# Patient Record
Sex: Female | Born: 1935 | Race: White | Hispanic: No | Marital: Married | Smoking: Never smoker
Health system: Southern US, Community
[De-identification: ages and names within clinical notes are randomized; demographics above are authoritative.]

## PROBLEM LIST (undated history)

## (undated) DIAGNOSIS — M8580 Other specified disorders of bone density and structure, unspecified site: Secondary | ICD-10-CM

## (undated) DIAGNOSIS — I480 Paroxysmal atrial fibrillation: Secondary | ICD-10-CM

## (undated) DIAGNOSIS — E538 Deficiency of other specified B group vitamins: Secondary | ICD-10-CM

## (undated) DIAGNOSIS — K449 Diaphragmatic hernia without obstruction or gangrene: Secondary | ICD-10-CM

## (undated) DIAGNOSIS — I7090 Unspecified atherosclerosis: Secondary | ICD-10-CM

## (undated) DIAGNOSIS — M797 Fibromyalgia: Secondary | ICD-10-CM

## (undated) DIAGNOSIS — I10 Essential (primary) hypertension: Secondary | ICD-10-CM

## (undated) DIAGNOSIS — F419 Anxiety disorder, unspecified: Secondary | ICD-10-CM

## (undated) DIAGNOSIS — F32A Depression, unspecified: Secondary | ICD-10-CM

## (undated) DIAGNOSIS — I38 Endocarditis, valve unspecified: Secondary | ICD-10-CM

## (undated) DIAGNOSIS — I495 Sick sinus syndrome: Secondary | ICD-10-CM

## (undated) DIAGNOSIS — F329 Major depressive disorder, single episode, unspecified: Secondary | ICD-10-CM

## (undated) DIAGNOSIS — K589 Irritable bowel syndrome without diarrhea: Secondary | ICD-10-CM

## (undated) DIAGNOSIS — M169 Osteoarthritis of hip, unspecified: Secondary | ICD-10-CM

## (undated) HISTORY — DX: Paroxysmal atrial fibrillation: I48.0

## (undated) HISTORY — DX: Other specified disorders of bone density and structure, unspecified site: M85.80

## (undated) HISTORY — DX: Depression, unspecified: F32.A

## (undated) HISTORY — DX: Essential (primary) hypertension: I10

## (undated) HISTORY — DX: Sick sinus syndrome: I49.5

## (undated) HISTORY — PX: CARDIAC VALVE REPLACEMENT: SHX585

## (undated) HISTORY — DX: Anxiety disorder, unspecified: F41.9

## (undated) HISTORY — DX: Endocarditis, valve unspecified: I38

## (undated) HISTORY — DX: Unspecified atherosclerosis: I70.90

## (undated) HISTORY — DX: Irritable bowel syndrome, unspecified: K58.9

## (undated) HISTORY — DX: Fibromyalgia: M79.7

## (undated) HISTORY — DX: Osteoarthritis of hip, unspecified: M16.9

## (undated) HISTORY — DX: Deficiency of other specified B group vitamins: E53.8

## (undated) HISTORY — DX: Major depressive disorder, single episode, unspecified: F32.9

## (undated) HISTORY — DX: Diaphragmatic hernia without obstruction or gangrene: K44.9

---
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
## 1970-03-16 HISTORY — PX: PARTIAL HYSTERECTOMY: SHX80

## 1974-03-16 HISTORY — PX: PLACEMENT OF BREAST IMPLANTS: SHX6334

## 1987-03-17 HISTORY — PX: LUMBAR LAMINECTOMY: SHX95

## 2004-05-14 ENCOUNTER — Ambulatory Visit: Payer: Self-pay | Admitting: Unknown Physician Specialty

## 2004-06-07 ENCOUNTER — Inpatient Hospital Stay: Payer: Self-pay | Admitting: Internal Medicine

## 2004-08-20 ENCOUNTER — Ambulatory Visit: Payer: Self-pay | Admitting: Internal Medicine

## 2004-08-26 ENCOUNTER — Emergency Department: Payer: Self-pay | Admitting: Emergency Medicine

## 2004-08-26 ENCOUNTER — Other Ambulatory Visit: Payer: Self-pay

## 2004-09-24 ENCOUNTER — Ambulatory Visit: Payer: Self-pay | Admitting: Unknown Physician Specialty

## 2005-07-22 ENCOUNTER — Ambulatory Visit: Payer: Self-pay | Admitting: Unknown Physician Specialty

## 2005-08-14 ENCOUNTER — Ambulatory Visit: Payer: Self-pay | Admitting: Unknown Physician Specialty

## 2006-01-16 ENCOUNTER — Ambulatory Visit: Payer: Self-pay | Admitting: Unknown Physician Specialty

## 2006-04-15 ENCOUNTER — Encounter: Payer: Self-pay | Admitting: Otolaryngology

## 2006-04-16 ENCOUNTER — Encounter: Payer: Self-pay | Admitting: Otolaryngology

## 2006-05-20 ENCOUNTER — Encounter: Payer: Self-pay | Admitting: Otolaryngology

## 2006-06-01 ENCOUNTER — Emergency Department: Payer: Self-pay | Admitting: Unknown Physician Specialty

## 2006-06-01 ENCOUNTER — Other Ambulatory Visit: Payer: Self-pay

## 2006-06-15 ENCOUNTER — Encounter: Payer: Self-pay | Admitting: Otolaryngology

## 2006-07-17 ENCOUNTER — Emergency Department: Payer: Self-pay | Admitting: Internal Medicine

## 2006-09-04 ENCOUNTER — Other Ambulatory Visit: Payer: Self-pay

## 2006-09-04 ENCOUNTER — Emergency Department: Payer: Self-pay | Admitting: Emergency Medicine

## 2006-09-05 ENCOUNTER — Inpatient Hospital Stay: Payer: Self-pay | Admitting: Internal Medicine

## 2006-09-05 ENCOUNTER — Other Ambulatory Visit: Payer: Self-pay

## 2006-09-06 IMAGING — US US CAROTID DUPLEX BILAT
1 series · 17 of 24 positions shown · non-contrast
Comparison: none

REASON FOR EXAM: syncope
COMMENTS:

[Series 1: us carotid duplex bilat · 17 of 52 slices shown]
[im 1/52]
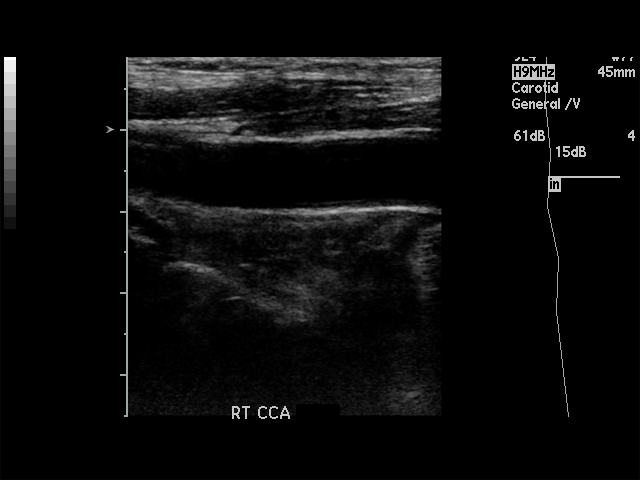
[im 5/52]
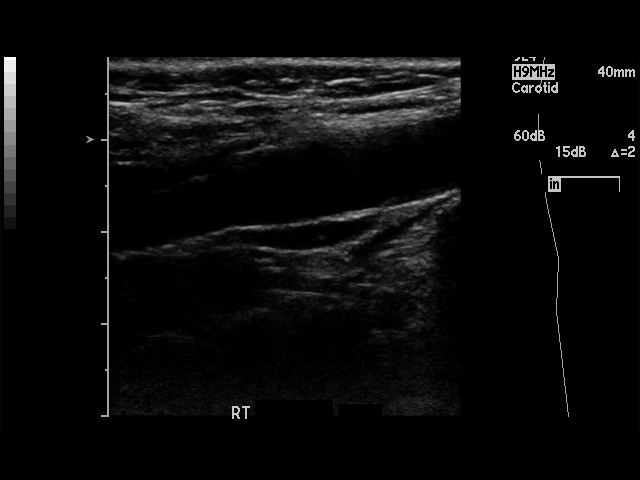
[im 7/52]
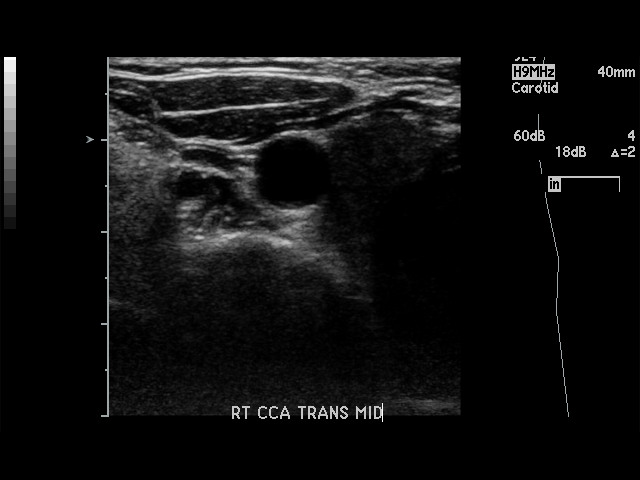
[im 9/52]
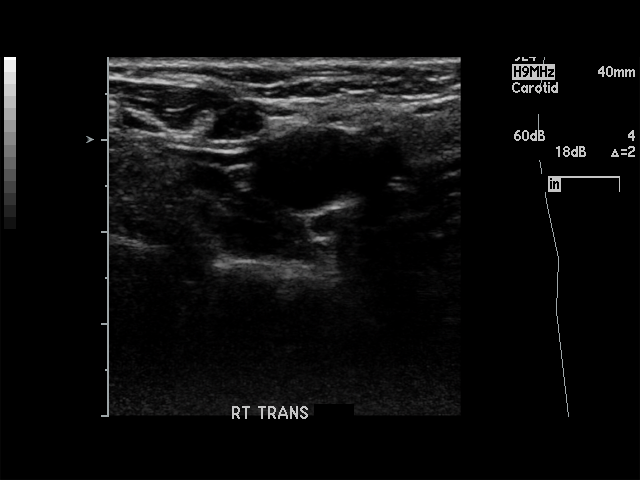
[im 14/52]
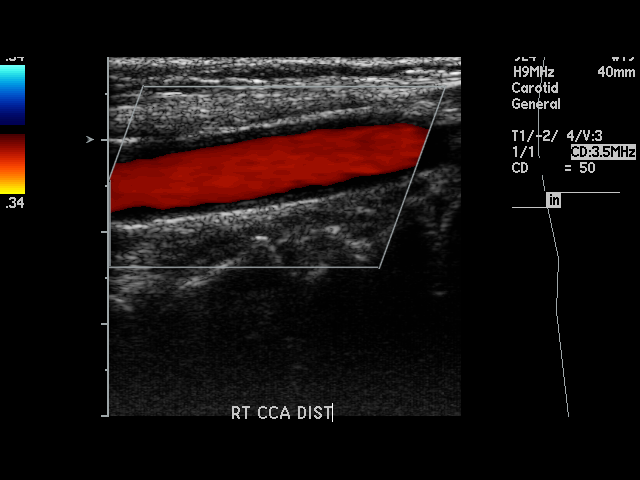
[im 16/52]
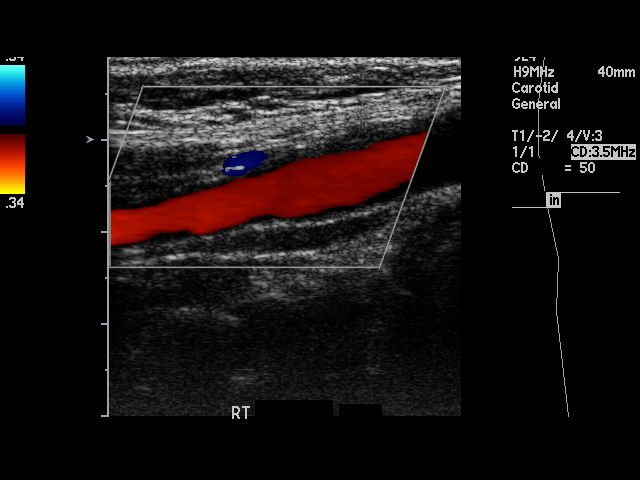
[im 20/52]
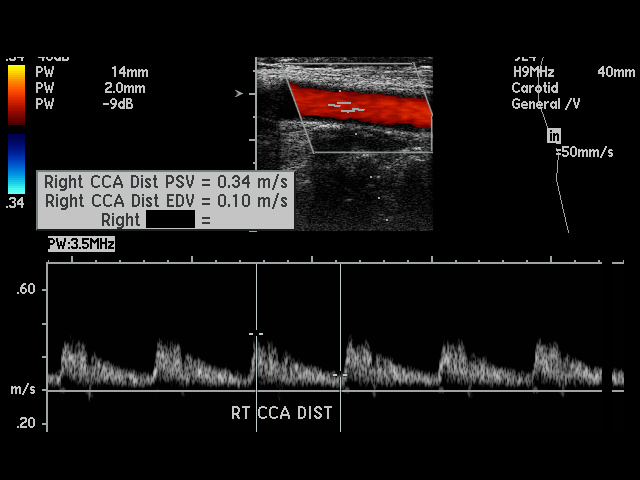
[im 23/52]
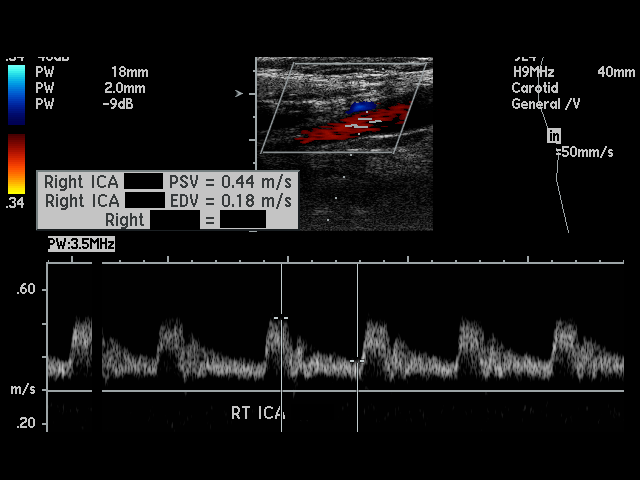
[im 27/52]
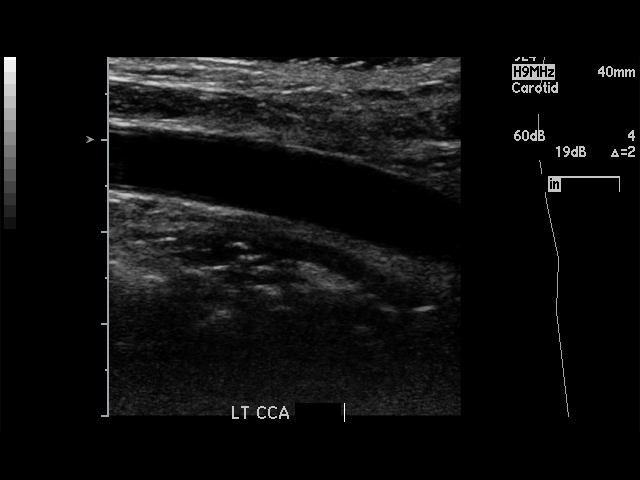
[im 29/52]
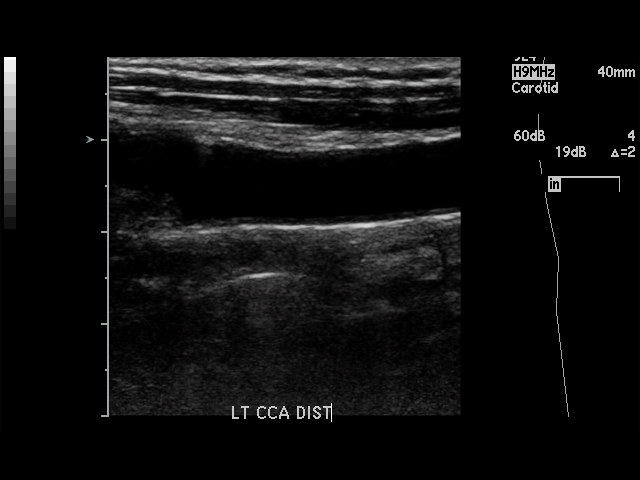
[im 32/52]
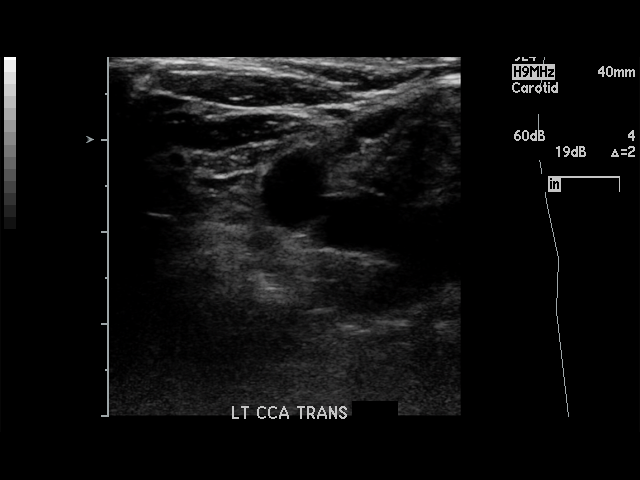
[im 36/52]
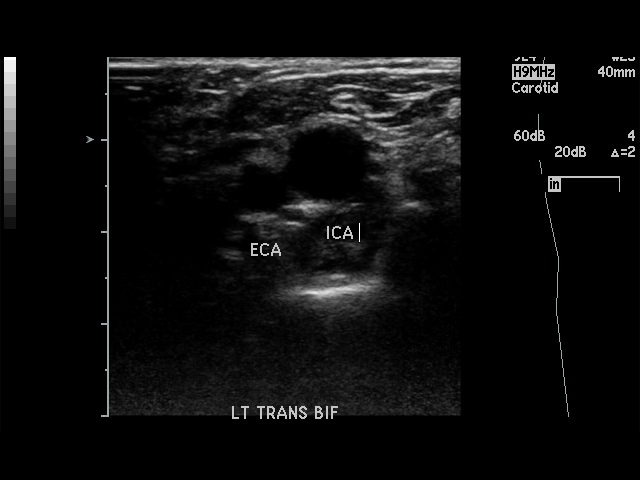
[im 38/52]
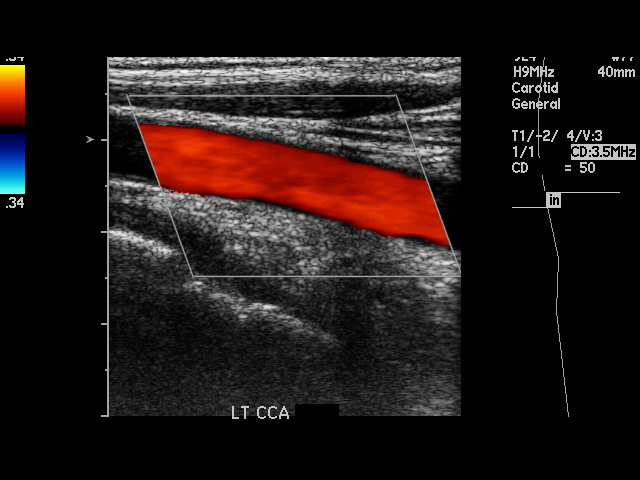
[im 43/52]
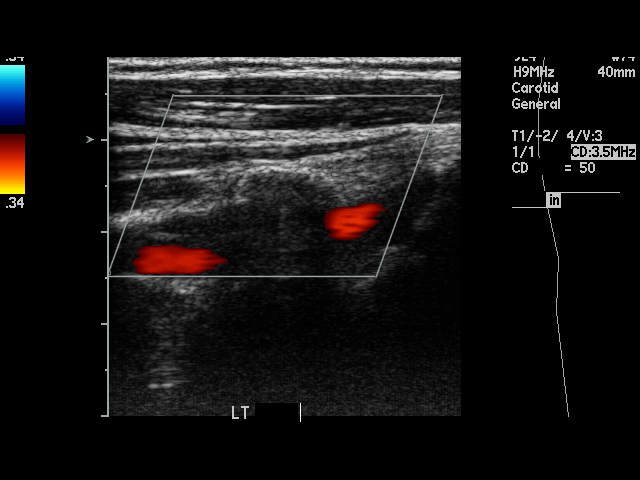
[im 45/52]
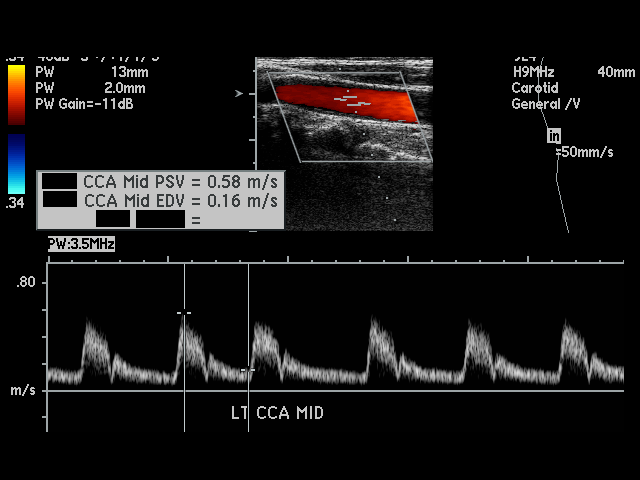
[im 47/52]
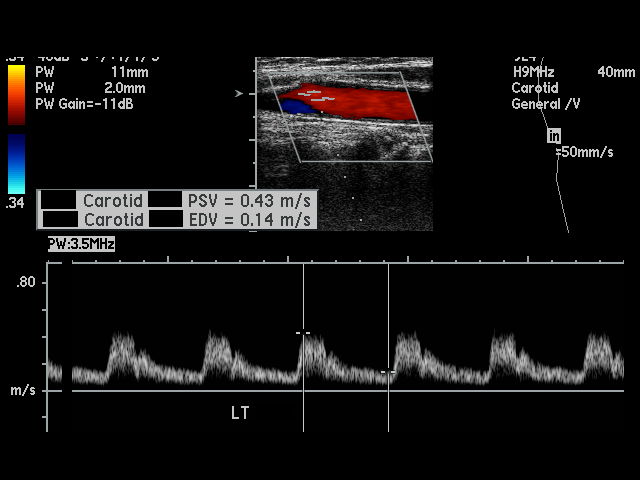
[im 52/52]
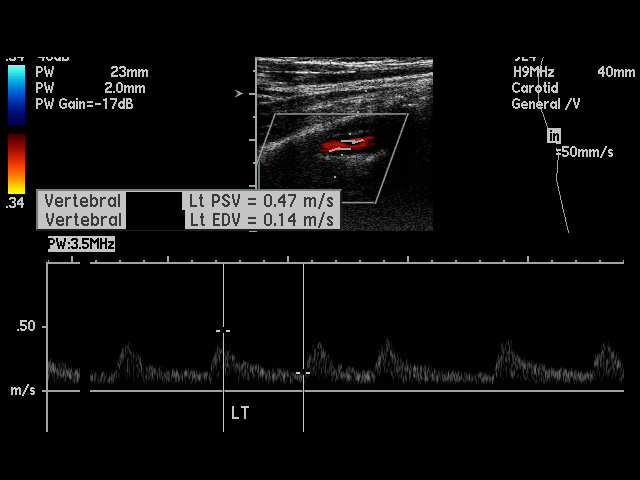

[17 of 24 positions shown; findings below may reference images not displayed]

PROCEDURE:     US  - US CAROTID DOPPLER BILATERAL  - [DATE] [DATE]

RESULT:     No definite plaque formation is identified on either side. On
the RIGHT, the peak common carotid artery flow velocity measures 0.52 meters
per second and the peak RIGHT internal carotid artery flow velocity measures
0.76 meters per second.  The IC/CC ratio is 1.57. On the LEFT, the peak LEFT
common carotid artery flow velocity measures 0.58 meters per second and the
peak LEFT internal carotid artery flow velocity measures 0.69 meters per
second. The IC/CC ratio is 1.18.  These values bilaterally are consistent
with the absence of hemodynamically significant stenosis. Antegrade flow is
noted in both vertebrals.
IMPRESSION: 1. Normal study. No plaque formation or significant stenosis is identified
on either side.
2. Antegrade flow is present in both vertebrals.

## 2007-01-17 ENCOUNTER — Other Ambulatory Visit: Payer: Self-pay

## 2007-01-17 ENCOUNTER — Inpatient Hospital Stay: Payer: Self-pay | Admitting: Specialist

## 2007-01-20 ENCOUNTER — Other Ambulatory Visit: Payer: Self-pay

## 2008-04-13 ENCOUNTER — Ambulatory Visit: Payer: Self-pay | Admitting: Cardiology

## 2008-06-07 ENCOUNTER — Ambulatory Visit: Payer: Self-pay | Admitting: Cardiology

## 2008-06-08 ENCOUNTER — Ambulatory Visit: Payer: Self-pay | Admitting: Cardiology

## 2009-07-23 ENCOUNTER — Ambulatory Visit: Payer: Self-pay | Admitting: Family Medicine

## 2009-10-10 ENCOUNTER — Ambulatory Visit: Payer: Self-pay | Admitting: Unknown Physician Specialty

## 2009-12-25 ENCOUNTER — Ambulatory Visit: Payer: Self-pay | Admitting: Orthopedic Surgery

## 2009-12-31 ENCOUNTER — Ambulatory Visit: Payer: Self-pay | Admitting: Orthopedic Surgery

## 2011-03-25 ENCOUNTER — Ambulatory Visit: Payer: Self-pay | Admitting: Ophthalmology

## 2011-06-15 ENCOUNTER — Ambulatory Visit: Payer: Self-pay | Admitting: Unknown Physician Specialty

## 2011-12-10 ENCOUNTER — Ambulatory Visit: Payer: Self-pay | Admitting: Internal Medicine

## 2011-12-10 LAB — PROTIME-INR
INR: 2.7
Prothrombin Time: 28.8 secs — ABNORMAL HIGH (ref 11.5–14.7)

## 2012-08-04 ENCOUNTER — Emergency Department: Payer: Self-pay | Admitting: Emergency Medicine

## 2012-08-04 LAB — PROTIME-INR
INR: 2.5
Prothrombin Time: 26.1 secs — ABNORMAL HIGH (ref 11.5–14.7)

## 2012-08-04 LAB — APTT: Activated PTT: 37.7 secs — ABNORMAL HIGH (ref 23.6–35.9)

## 2012-09-15 ENCOUNTER — Ambulatory Visit: Payer: Self-pay | Admitting: Hematology and Oncology

## 2012-09-15 LAB — CBC CANCER CENTER
Basophil %: 1.6 %
Eosinophil #: 0.1 x10 3/mm (ref 0.0–0.7)
HCT: 31.7 % — ABNORMAL LOW (ref 35.0–47.0)
Lymphocyte #: 1.3 x10 3/mm (ref 1.0–3.6)
Lymphocyte %: 32.2 %
MCHC: 33.2 g/dL (ref 32.0–36.0)
MCV: 93 fL (ref 80–100)
Monocyte #: 0.4 x10 3/mm (ref 0.2–0.9)
Monocyte %: 9.7 %
Neutrophil #: 2.2 x10 3/mm (ref 1.4–6.5)
RBC: 3.41 10*6/uL — ABNORMAL LOW (ref 3.80–5.20)
RDW: 17.6 % — ABNORMAL HIGH (ref 11.5–14.5)
WBC: 4.1 x10 3/mm (ref 3.6–11.0)

## 2012-09-22 ENCOUNTER — Emergency Department: Payer: Self-pay | Admitting: Emergency Medicine

## 2012-09-22 LAB — COMPREHENSIVE METABOLIC PANEL
Albumin: 4 g/dL (ref 3.4–5.0)
Alkaline Phosphatase: 99 U/L (ref 50–136)
Anion Gap: 2 — ABNORMAL LOW (ref 7–16)
BUN: 21 mg/dL — ABNORMAL HIGH (ref 7–18)
Bilirubin,Total: 0.5 mg/dL (ref 0.2–1.0)
Chloride: 109 mmol/L — ABNORMAL HIGH (ref 98–107)
Creatinine: 1.07 mg/dL (ref 0.60–1.30)
EGFR (Non-African Amer.): 50 — ABNORMAL LOW
Osmolality: 280 (ref 275–301)
Potassium: 4.4 mmol/L (ref 3.5–5.1)
Total Protein: 7.8 g/dL (ref 6.4–8.2)

## 2012-09-22 LAB — CBC
MCH: 31 pg (ref 26.0–34.0)
MCHC: 33.8 g/dL (ref 32.0–36.0)
MCV: 92 fL (ref 80–100)
RBC: 3.11 10*6/uL — ABNORMAL LOW (ref 3.80–5.20)

## 2012-09-22 LAB — URINALYSIS, COMPLETE
Bacteria: NONE SEEN
Glucose,UR: NEGATIVE mg/dL (ref 0–75)
Ketone: NEGATIVE
Nitrite: NEGATIVE
Ph: 6 (ref 4.5–8.0)
Protein: NEGATIVE
RBC,UR: 68 /HPF (ref 0–5)
Specific Gravity: 1.009 (ref 1.003–1.030)
WBC UR: 15 /HPF (ref 0–5)

## 2012-10-14 ENCOUNTER — Ambulatory Visit: Payer: Self-pay | Admitting: Hematology and Oncology

## 2013-01-03 ENCOUNTER — Ambulatory Visit: Payer: Self-pay | Admitting: Hematology and Oncology

## 2013-01-03 LAB — IRON AND TIBC
Iron Bind.Cap.(Total): 412 ug/dL (ref 250–450)
Unbound Iron-Bind.Cap.: 360 ug/dL

## 2013-01-03 LAB — CBC CANCER CENTER
Basophil %: 1 %
Eosinophil %: 2.9 %
MCH: 32.1 pg (ref 26.0–34.0)
MCV: 96 fL (ref 80–100)
Monocyte #: 0.5 x10 3/mm (ref 0.2–0.9)
Neutrophil #: 3.1 x10 3/mm (ref 1.4–6.5)
Neutrophil %: 63.2 %
RDW: 17.3 % — ABNORMAL HIGH (ref 11.5–14.5)
WBC: 4.9 x10 3/mm (ref 3.6–11.0)

## 2013-01-03 LAB — RETICULOCYTES
Absolute Retic Count: 0.0371 10*6/uL (ref 0.019–0.186)
Reticulocyte: 1.11 % (ref 0.4–3.1)

## 2013-01-14 ENCOUNTER — Ambulatory Visit: Payer: Self-pay | Admitting: Hematology and Oncology

## 2013-01-31 LAB — IRON AND TIBC
Iron Saturation: 37 %
Unbound Iron-Bind.Cap.: 217 ug/dL

## 2013-01-31 LAB — CBC CANCER CENTER
Basophil #: 0.1 x10 3/mm (ref 0.0–0.1)
Basophil %: 1.3 %
Eosinophil #: 0.1 x10 3/mm (ref 0.0–0.7)
HGB: 11.8 g/dL — ABNORMAL LOW (ref 12.0–16.0)
Lymphocyte %: 21 %
MCH: 33 pg (ref 26.0–34.0)
MCHC: 33.8 g/dL (ref 32.0–36.0)
Platelet: 172 x10 3/mm (ref 150–440)
RBC: 3.58 10*6/uL — ABNORMAL LOW (ref 3.80–5.20)
RDW: 19.6 % — ABNORMAL HIGH (ref 11.5–14.5)

## 2013-02-13 ENCOUNTER — Ambulatory Visit: Payer: Self-pay | Admitting: Hematology and Oncology

## 2013-03-16 HISTORY — PX: REPLACEMENT TOTAL HIP W/  RESURFACING IMPLANTS: SUR1222

## 2013-04-24 ENCOUNTER — Ambulatory Visit: Payer: Self-pay | Admitting: Hematology and Oncology

## 2013-04-25 LAB — COMPREHENSIVE METABOLIC PANEL
ALBUMIN: 3.9 g/dL (ref 3.4–5.0)
ALT: 17 U/L (ref 12–78)
ANION GAP: 7 (ref 7–16)
Alkaline Phosphatase: 78 U/L
BUN: 16 mg/dL (ref 7–18)
Bilirubin,Total: 0.5 mg/dL (ref 0.2–1.0)
CREATININE: 0.95 mg/dL (ref 0.60–1.30)
Calcium, Total: 9 mg/dL (ref 8.5–10.1)
Chloride: 105 mmol/L (ref 98–107)
Co2: 30 mmol/L (ref 21–32)
EGFR (Non-African Amer.): 57 — ABNORMAL LOW
Glucose: 88 mg/dL (ref 65–99)
OSMOLALITY: 284 (ref 275–301)
Potassium: 4 mmol/L (ref 3.5–5.1)
SGOT(AST): 19 U/L (ref 15–37)
SODIUM: 142 mmol/L (ref 136–145)
TOTAL PROTEIN: 7.5 g/dL (ref 6.4–8.2)

## 2013-04-25 LAB — CBC CANCER CENTER
BASOS PCT: 1.3 %
Basophil #: 0.1 x10 3/mm (ref 0.0–0.1)
EOS ABS: 0.1 x10 3/mm (ref 0.0–0.7)
Eosinophil %: 2.2 %
HCT: 36.8 % (ref 35.0–47.0)
HGB: 11.9 g/dL — ABNORMAL LOW (ref 12.0–16.0)
LYMPHS ABS: 1.2 x10 3/mm (ref 1.0–3.6)
Lymphocyte %: 25.2 %
MCH: 33.2 pg (ref 26.0–34.0)
MCHC: 32.4 g/dL (ref 32.0–36.0)
MCV: 102 fL — ABNORMAL HIGH (ref 80–100)
MONOS PCT: 8.9 %
Monocyte #: 0.4 x10 3/mm (ref 0.2–0.9)
Neutrophil #: 2.9 x10 3/mm (ref 1.4–6.5)
Neutrophil %: 62.4 %
Platelet: 184 x10 3/mm (ref 150–440)
RBC: 3.6 10*6/uL — ABNORMAL LOW (ref 3.80–5.20)
RDW: 14.3 % (ref 11.5–14.5)
WBC: 4.7 x10 3/mm (ref 3.6–11.0)

## 2013-05-14 ENCOUNTER — Ambulatory Visit: Payer: Self-pay | Admitting: Hematology and Oncology

## 2013-05-31 DIAGNOSIS — I38 Endocarditis, valve unspecified: Secondary | ICD-10-CM | POA: Insufficient documentation

## 2013-05-31 DIAGNOSIS — I495 Sick sinus syndrome: Secondary | ICD-10-CM | POA: Insufficient documentation

## 2013-07-17 ENCOUNTER — Emergency Department: Payer: Self-pay | Admitting: Emergency Medicine

## 2013-07-17 LAB — URINALYSIS, COMPLETE
Bilirubin,UR: NEGATIVE
Blood: NEGATIVE
GLUCOSE, UR: NEGATIVE mg/dL (ref 0–75)
Nitrite: NEGATIVE
PH: 5 (ref 4.5–8.0)
PROTEIN: NEGATIVE
RBC,UR: 1 /HPF (ref 0–5)
SPECIFIC GRAVITY: 1.021 (ref 1.003–1.030)
Squamous Epithelial: <1
WBC UR: 10 /HPF (ref 0–5)

## 2013-07-17 LAB — COMPREHENSIVE METABOLIC PANEL
AST: 21 U/L (ref 15–37)
Albumin: 3.8 g/dL (ref 3.4–5.0)
Alkaline Phosphatase: 75 U/L
Anion Gap: 7 (ref 7–16)
BUN: 22 mg/dL — ABNORMAL HIGH (ref 7–18)
Bilirubin,Total: 1 mg/dL (ref 0.2–1.0)
CREATININE: 1.14 mg/dL (ref 0.60–1.30)
Calcium, Total: 9.5 mg/dL (ref 8.5–10.1)
Chloride: 109 mmol/L — ABNORMAL HIGH (ref 98–107)
Co2: 19 mmol/L — ABNORMAL LOW (ref 21–32)
EGFR (African American): 53 — ABNORMAL LOW
EGFR (Non-African Amer.): 46 — ABNORMAL LOW
GLUCOSE: 93 mg/dL (ref 65–99)
Osmolality: 273 (ref 275–301)
POTASSIUM: 4.5 mmol/L (ref 3.5–5.1)
SGPT (ALT): 13 U/L (ref 12–78)
SODIUM: 135 mmol/L — AB (ref 136–145)
Total Protein: 8.1 g/dL (ref 6.4–8.2)

## 2013-07-17 LAB — LIPASE, BLOOD: Lipase: 128 U/L (ref 73–393)

## 2013-07-17 LAB — CBC WITH DIFFERENTIAL/PLATELET
BASOS PCT: 0.6 %
Basophil #: 0.1 10*3/uL (ref 0.0–0.1)
EOS ABS: 0.2 10*3/uL (ref 0.0–0.7)
Eosinophil %: 2.2 %
HCT: 35.2 % (ref 35.0–47.0)
HGB: 11.6 g/dL — AB (ref 12.0–16.0)
LYMPHS ABS: 0.7 10*3/uL — AB (ref 1.0–3.6)
Lymphocyte %: 8 %
MCH: 33.2 pg (ref 26.0–34.0)
MCHC: 33.1 g/dL (ref 32.0–36.0)
MCV: 100 fL (ref 80–100)
MONO ABS: 0.7 x10 3/mm (ref 0.2–0.9)
Monocyte %: 8 %
Neutrophil #: 7.2 10*3/uL — ABNORMAL HIGH (ref 1.4–6.5)
Neutrophil %: 81.2 %
PLATELETS: 246 10*3/uL (ref 150–440)
RBC: 3.5 10*6/uL — ABNORMAL LOW (ref 3.80–5.20)
RDW: 14.6 % — ABNORMAL HIGH (ref 11.5–14.5)
WBC: 8.9 10*3/uL (ref 3.6–11.0)

## 2013-07-17 LAB — TROPONIN I: Troponin-I: <0.02 ng/mL

## 2013-07-18 LAB — CLOSTRIDIUM DIFFICILE(ARMC)

## 2013-07-20 LAB — STOOL CULTURE

## 2013-07-27 DIAGNOSIS — R109 Unspecified abdominal pain: Secondary | ICD-10-CM | POA: Insufficient documentation

## 2013-08-10 DIAGNOSIS — R197 Diarrhea, unspecified: Secondary | ICD-10-CM | POA: Insufficient documentation

## 2013-08-11 ENCOUNTER — Other Ambulatory Visit: Payer: Self-pay | Admitting: Unknown Physician Specialty

## 2013-08-11 LAB — CLOSTRIDIUM DIFFICILE(ARMC)

## 2013-08-13 LAB — STOOL CULTURE

## 2013-11-01 DIAGNOSIS — K589 Irritable bowel syndrome without diarrhea: Secondary | ICD-10-CM | POA: Insufficient documentation

## 2013-11-01 DIAGNOSIS — E538 Deficiency of other specified B group vitamins: Secondary | ICD-10-CM | POA: Insufficient documentation

## 2013-11-01 DIAGNOSIS — M169 Osteoarthritis of hip, unspecified: Secondary | ICD-10-CM | POA: Insufficient documentation

## 2013-12-14 ENCOUNTER — Ambulatory Visit: Payer: Self-pay | Admitting: Ophthalmology

## 2013-12-14 LAB — PROTIME-INR
INR: 3
PROTHROMBIN TIME: 29.9 s — AB (ref 11.5–14.7)

## 2013-12-27 ENCOUNTER — Ambulatory Visit: Payer: Self-pay | Admitting: Ophthalmology

## 2013-12-27 LAB — PROTIME-INR
INR: 2.1
Prothrombin Time: 23.1 secs — ABNORMAL HIGH (ref 11.5–14.7)

## 2013-12-27 LAB — HEMOGLOBIN: HGB: 8.4 g/dL — ABNORMAL LOW (ref 12.0–16.0)

## 2014-01-08 ENCOUNTER — Ambulatory Visit: Payer: Self-pay | Admitting: Internal Medicine

## 2014-01-14 ENCOUNTER — Ambulatory Visit: Payer: Self-pay | Admitting: Internal Medicine

## 2014-01-17 ENCOUNTER — Ambulatory Visit: Payer: Self-pay | Admitting: Internal Medicine

## 2014-02-06 ENCOUNTER — Inpatient Hospital Stay: Payer: Self-pay | Admitting: Internal Medicine

## 2014-02-06 LAB — PROTIME-INR
INR: 2.6
Prothrombin Time: 27.2 secs — ABNORMAL HIGH (ref 11.5–14.7)

## 2014-02-06 LAB — CBC
HCT: 32.1 % — ABNORMAL LOW (ref 35.0–47.0)
HGB: 10.2 g/dL — AB (ref 12.0–16.0)
MCH: 32.5 pg (ref 26.0–34.0)
MCHC: 31.8 g/dL — AB (ref 32.0–36.0)
MCV: 102 fL — AB (ref 80–100)
PLATELETS: 179 10*3/uL (ref 150–440)
RBC: 3.13 10*6/uL — ABNORMAL LOW (ref 3.80–5.20)
RDW: 22.1 % — AB (ref 11.5–14.5)
WBC: 7.9 10*3/uL (ref 3.6–11.0)

## 2014-02-06 LAB — BASIC METABOLIC PANEL
Anion Gap: 8 (ref 7–16)
BUN: 17 mg/dL (ref 7–18)
CO2: 24 mmol/L (ref 21–32)
CREATININE: 1.17 mg/dL (ref 0.60–1.30)
Calcium, Total: 8.8 mg/dL (ref 8.5–10.1)
Chloride: 112 mmol/L — ABNORMAL HIGH (ref 98–107)
EGFR (African American): 57 — ABNORMAL LOW
EGFR (Non-African Amer.): 47 — ABNORMAL LOW
GLUCOSE: 106 mg/dL — AB (ref 65–99)
Osmolality: 289 (ref 275–301)
POTASSIUM: 4.4 mmol/L (ref 3.5–5.1)
Sodium: 144 mmol/L (ref 136–145)

## 2014-02-06 LAB — URINALYSIS, COMPLETE
BILIRUBIN, UR: NEGATIVE
Blood: NEGATIVE
GLUCOSE, UR: NEGATIVE mg/dL (ref 0–75)
Ketone: NEGATIVE
Leukocyte Esterase: NEGATIVE
Nitrite: NEGATIVE
PH: 6 (ref 4.5–8.0)
PROTEIN: NEGATIVE
SPECIFIC GRAVITY: 1.014 (ref 1.003–1.030)
Squamous Epithelial: 1

## 2014-02-06 LAB — APTT: Activated PTT: 36 secs — ABNORMAL HIGH (ref 23.6–35.9)

## 2014-02-06 LAB — CK: CK, TOTAL: 118 U/L (ref 26–192)

## 2014-02-07 LAB — URINALYSIS, COMPLETE
BACTERIA: NONE SEEN
Bilirubin,UR: NEGATIVE
Glucose,UR: NEGATIVE mg/dL (ref 0–75)
KETONE: NEGATIVE
LEUKOCYTE ESTERASE: NEGATIVE
Nitrite: NEGATIVE
Ph: 5 (ref 4.5–8.0)
Protein: 30
SPECIFIC GRAVITY: 1.029 (ref 1.003–1.030)
Squamous Epithelial: 1

## 2014-02-07 LAB — BASIC METABOLIC PANEL
Anion Gap: 6 — ABNORMAL LOW (ref 7–16)
BUN: 17 mg/dL (ref 7–18)
CALCIUM: 8.1 mg/dL — AB (ref 8.5–10.1)
CHLORIDE: 110 mmol/L — AB (ref 98–107)
CO2: 26 mmol/L (ref 21–32)
Creatinine: 1.09 mg/dL (ref 0.60–1.30)
EGFR (African American): >60
GFR CALC NON AF AMER: 51 — AB
GLUCOSE: 106 mg/dL — AB (ref 65–99)
OSMOLALITY: 285 (ref 275–301)
Potassium: 4 mmol/L (ref 3.5–5.1)
SODIUM: 142 mmol/L (ref 136–145)

## 2014-02-07 LAB — CBC WITH DIFFERENTIAL/PLATELET
BASOS PCT: 0.4 %
Basophil #: 0 10*3/uL (ref 0.0–0.1)
Eosinophil #: 0.1 10*3/uL (ref 0.0–0.7)
Eosinophil %: 1.2 %
HCT: 27.8 % — ABNORMAL LOW (ref 35.0–47.0)
HGB: 8.9 g/dL — AB (ref 12.0–16.0)
Lymphocyte #: 0.6 10*3/uL — ABNORMAL LOW (ref 1.0–3.6)
Lymphocyte %: 7.8 %
MCH: 33 pg (ref 26.0–34.0)
MCHC: 32.2 g/dL (ref 32.0–36.0)
MCV: 103 fL — ABNORMAL HIGH (ref 80–100)
Monocyte #: 0.5 x10 3/mm (ref 0.2–0.9)
Monocyte %: 6.5 %
NEUTROS PCT: 84.1 %
Neutrophil #: 6 10*3/uL (ref 1.4–6.5)
Platelet: 133 10*3/uL — ABNORMAL LOW (ref 150–440)
RBC: 2.71 10*6/uL — ABNORMAL LOW (ref 3.80–5.20)
RDW: 22.1 % — AB (ref 11.5–14.5)
WBC: 7.1 10*3/uL (ref 3.6–11.0)

## 2014-02-07 LAB — PROTIME-INR
INR: 2.1
INR: 1.7
INR: 1.4
PROTHROMBIN TIME: 23.2 s — AB (ref 11.5–14.7)
PROTHROMBIN TIME: 19.3 s — AB (ref 11.5–14.7)
PROTHROMBIN TIME: 17.3 s — AB (ref 11.5–14.7)

## 2014-02-07 LAB — APTT: Activated PTT: 34.8 secs (ref 23.6–35.9)

## 2014-02-08 LAB — CBC WITH DIFFERENTIAL/PLATELET
Basophil #: 0 10*3/uL (ref 0.0–0.1)
Basophil %: 0.7 %
Eosinophil #: 0.1 10*3/uL (ref 0.0–0.7)
Eosinophil %: 1.2 %
HCT: 26.3 % — AB (ref 35.0–47.0)
HGB: 8.5 g/dL — AB (ref 12.0–16.0)
LYMPHS ABS: 0.3 10*3/uL — AB (ref 1.0–3.6)
LYMPHS PCT: 4.8 %
MCH: 33 pg (ref 26.0–34.0)
MCHC: 32.3 g/dL (ref 32.0–36.0)
MCV: 102 fL — AB (ref 80–100)
MONO ABS: 0.5 x10 3/mm (ref 0.2–0.9)
MONOS PCT: 7.4 %
Neutrophil #: 5.8 10*3/uL (ref 1.4–6.5)
Neutrophil %: 85.9 %
Platelet: 111 10*3/uL — ABNORMAL LOW (ref 150–440)
RBC: 2.58 10*6/uL — ABNORMAL LOW (ref 3.80–5.20)
RDW: 21.7 % — AB (ref 11.5–14.5)
WBC: 6.7 10*3/uL (ref 3.6–11.0)

## 2014-02-08 LAB — BASIC METABOLIC PANEL
Anion Gap: 5 — ABNORMAL LOW (ref 7–16)
BUN: 19 mg/dL — AB (ref 7–18)
CALCIUM: 7.7 mg/dL — AB (ref 8.5–10.1)
CO2: 23 mmol/L (ref 21–32)
Chloride: 113 mmol/L — ABNORMAL HIGH (ref 98–107)
Creatinine: 1.09 mg/dL (ref 0.60–1.30)
EGFR (African American): >60
EGFR (Non-African Amer.): 51 — ABNORMAL LOW
Glucose: 115 mg/dL — ABNORMAL HIGH (ref 65–99)
OSMOLALITY: 284 (ref 275–301)
POTASSIUM: 4.3 mmol/L (ref 3.5–5.1)
Sodium: 141 mmol/L (ref 136–145)

## 2014-02-08 LAB — PROTIME-INR
INR: 1.4
Prothrombin Time: 16.9 secs — ABNORMAL HIGH (ref 11.5–14.7)

## 2014-02-08 LAB — APTT: ACTIVATED PTT: 36.2 s — AB (ref 23.6–35.9)

## 2014-02-09 ENCOUNTER — Encounter: Payer: Self-pay | Admitting: Internal Medicine

## 2014-02-09 LAB — BASIC METABOLIC PANEL
Anion Gap: 4 — ABNORMAL LOW (ref 7–16)
BUN: 18 mg/dL (ref 7–18)
CALCIUM: 8.5 mg/dL (ref 8.5–10.1)
Chloride: 110 mmol/L — ABNORMAL HIGH (ref 98–107)
Co2: 26 mmol/L (ref 21–32)
Creatinine: 1.01 mg/dL (ref 0.60–1.30)
GFR CALC NON AF AMER: 56 — AB
Glucose: 93 mg/dL (ref 65–99)
Osmolality: 281 (ref 275–301)
POTASSIUM: 4.9 mmol/L (ref 3.5–5.1)
SODIUM: 140 mmol/L (ref 136–145)

## 2014-02-09 LAB — URINE CULTURE

## 2014-02-09 LAB — PROTIME-INR
INR: 1.3
Prothrombin Time: 16.3 secs — ABNORMAL HIGH (ref 11.5–14.7)

## 2014-02-09 LAB — HEMOGLOBIN: HGB: 8.3 g/dL — AB (ref 12.0–16.0)

## 2014-02-09 LAB — APTT: ACTIVATED PTT: 39.7 s — AB (ref 23.6–35.9)

## 2014-02-10 LAB — BASIC METABOLIC PANEL
Anion Gap: 5 — ABNORMAL LOW (ref 7–16)
BUN: 18 mg/dL (ref 7–18)
Calcium, Total: 8.5 mg/dL (ref 8.5–10.1)
Chloride: 109 mmol/L — ABNORMAL HIGH (ref 98–107)
Co2: 24 mmol/L (ref 21–32)
Creatinine: 0.9 mg/dL (ref 0.60–1.30)
EGFR (African American): >60
EGFR (Non-African Amer.): >60
Glucose: 107 mg/dL — ABNORMAL HIGH (ref 65–99)
Osmolality: 278 (ref 275–301)
Potassium: 4.5 mmol/L (ref 3.5–5.1)
Sodium: 138 mmol/L (ref 136–145)

## 2014-02-10 LAB — PROTIME-INR
INR: 1.4
Prothrombin Time: 17 secs — ABNORMAL HIGH (ref 11.5–14.7)

## 2014-02-10 LAB — HEMOGLOBIN: HGB: 8.1 g/dL — ABNORMAL LOW (ref 12.0–16.0)

## 2014-02-10 LAB — APTT: ACTIVATED PTT: 46.6 s — AB (ref 23.6–35.9)

## 2014-02-11 LAB — CBC WITH DIFFERENTIAL/PLATELET
BASOS ABS: 0 10*3/uL (ref 0.0–0.1)
Basophil %: 1 %
EOS ABS: 0.1 10*3/uL (ref 0.0–0.7)
EOS PCT: 2.2 %
HCT: 23.7 % — AB (ref 35.0–47.0)
HGB: 7.7 g/dL — ABNORMAL LOW (ref 12.0–16.0)
LYMPHS ABS: 0.4 10*3/uL — AB (ref 1.0–3.6)
LYMPHS PCT: 8.8 %
MCH: 33.1 pg (ref 26.0–34.0)
MCHC: 32.5 g/dL (ref 32.0–36.0)
MCV: 102 fL — ABNORMAL HIGH (ref 80–100)
MONOS PCT: 13.2 %
Monocyte #: 0.6 x10 3/mm (ref 0.2–0.9)
NEUTROS PCT: 74.8 %
Neutrophil #: 3.2 10*3/uL (ref 1.4–6.5)
Platelet: 126 10*3/uL — ABNORMAL LOW (ref 150–440)
RBC: 2.33 10*6/uL — ABNORMAL LOW (ref 3.80–5.20)
RDW: 21.6 % — AB (ref 11.5–14.5)
WBC: 4.3 10*3/uL (ref 3.6–11.0)

## 2014-02-11 LAB — PROTIME-INR
INR: 2.2
Prothrombin Time: 23.6 secs — ABNORMAL HIGH (ref 11.5–14.7)

## 2014-02-12 LAB — CBC WITH DIFFERENTIAL/PLATELET
Basophil #: 0.1 10*3/uL (ref 0.0–0.1)
Basophil %: 1.3 %
EOS ABS: 0.1 10*3/uL (ref 0.0–0.7)
Eosinophil %: 2.4 %
HCT: 27.2 % — ABNORMAL LOW (ref 35.0–47.0)
HGB: 9 g/dL — AB (ref 12.0–16.0)
Lymphocyte #: 0.7 10*3/uL — ABNORMAL LOW (ref 1.0–3.6)
Lymphocyte %: 12.2 %
MCH: 32.4 pg (ref 26.0–34.0)
MCHC: 33 g/dL (ref 32.0–36.0)
MCV: 98 fL (ref 80–100)
Monocyte #: 0.6 x10 3/mm (ref 0.2–0.9)
Monocyte %: 9.7 %
NEUTROS PCT: 74.4 %
Neutrophil #: 4.4 10*3/uL (ref 1.4–6.5)
PLATELETS: 151 10*3/uL (ref 150–440)
RBC: 2.77 10*6/uL — AB (ref 3.80–5.20)
RDW: 23.1 % — AB (ref 11.5–14.5)
WBC: 5.9 10*3/uL (ref 3.6–11.0)

## 2014-02-12 LAB — BASIC METABOLIC PANEL
ANION GAP: 4 — AB (ref 7–16)
BUN: 19 mg/dL — ABNORMAL HIGH (ref 7–18)
CREATININE: 0.82 mg/dL (ref 0.60–1.30)
Calcium, Total: 8.6 mg/dL (ref 8.5–10.1)
Chloride: 109 mmol/L — ABNORMAL HIGH (ref 98–107)
Co2: 30 mmol/L (ref 21–32)
EGFR (African American): >60
Glucose: 91 mg/dL (ref 65–99)
OSMOLALITY: 287 (ref 275–301)
POTASSIUM: 4.2 mmol/L (ref 3.5–5.1)
SODIUM: 143 mmol/L (ref 136–145)

## 2014-02-12 LAB — PROTIME-INR
INR: 2.8
Prothrombin Time: 28.4 secs — ABNORMAL HIGH (ref 11.5–14.7)

## 2014-02-13 LAB — CBC WITH DIFFERENTIAL/PLATELET
Basophil #: 0.1 10*3/uL (ref 0.0–0.1)
Basophil %: 1.5 %
EOS PCT: 2.5 %
Eosinophil #: 0.2 10*3/uL (ref 0.0–0.7)
HCT: 28.1 % — AB (ref 35.0–47.0)
HGB: 9.1 g/dL — AB (ref 12.0–16.0)
Lymphocyte #: 1 10*3/uL (ref 1.0–3.6)
Lymphocyte %: 15.8 %
MCH: 31.7 pg (ref 26.0–34.0)
MCHC: 32.3 g/dL (ref 32.0–36.0)
MCV: 98 fL (ref 80–100)
Monocyte #: 0.6 x10 3/mm (ref 0.2–0.9)
Monocyte %: 10.4 %
Neutrophil #: 4.3 10*3/uL (ref 1.4–6.5)
Neutrophil %: 69.8 %
Platelet: 186 10*3/uL (ref 150–440)
RBC: 2.86 10*6/uL — ABNORMAL LOW (ref 3.80–5.20)
RDW: 22.5 % — ABNORMAL HIGH (ref 11.5–14.5)
WBC: 6.2 10*3/uL (ref 3.6–11.0)

## 2014-02-13 LAB — TSH: THYROID STIMULATING HORM: 5.13 u[IU]/mL — AB

## 2014-02-13 LAB — PROTIME-INR
INR: 2.7
Prothrombin Time: 27.6 secs — ABNORMAL HIGH (ref 11.5–14.7)

## 2014-02-14 ENCOUNTER — Encounter: Payer: Self-pay | Admitting: Internal Medicine

## 2014-02-14 ENCOUNTER — Ambulatory Visit: Payer: Self-pay | Admitting: Hematology and Oncology

## 2014-02-20 LAB — PROTIME-INR
INR: 2.7
PROTHROMBIN TIME: 27.6 s — AB (ref 11.5–14.7)

## 2014-02-27 LAB — URINALYSIS, COMPLETE
BILIRUBIN, UR: NEGATIVE
BLOOD: NEGATIVE
Bacteria: NONE SEEN
Glucose,UR: NEGATIVE mg/dL (ref 0–75)
Ketone: NEGATIVE
Leukocyte Esterase: NEGATIVE
Nitrite: NEGATIVE
PH: 6 (ref 4.5–8.0)
PROTEIN: NEGATIVE
RBC,UR: 1 /HPF (ref 0–5)
Specific Gravity: 1.015 (ref 1.003–1.030)
WBC UR: 8 /HPF (ref 0–5)

## 2014-03-02 LAB — URINE CULTURE

## 2014-03-05 LAB — PROTIME-INR
INR: 3.8
Prothrombin Time: 36.5 secs — ABNORMAL HIGH (ref 11.5–14.7)

## 2014-03-06 LAB — PROTIME-INR
INR: 3.6
PROTHROMBIN TIME: 34.6 s — AB (ref 11.5–14.7)

## 2014-03-08 LAB — PROTIME-INR
INR: 2.2
PROTHROMBIN TIME: 24.2 s — AB (ref 11.5–14.7)

## 2014-03-16 ENCOUNTER — Encounter: Payer: Self-pay | Admitting: Internal Medicine

## 2014-04-02 ENCOUNTER — Ambulatory Visit: Payer: Self-pay | Admitting: Orthopedic Surgery

## 2014-07-07 NOTE — Op Note (Signed)
PATIENT NAME:  Tina Barron, Tina Barron MR#:  [id number] DATE OF BIRTH:  [birth year]  DATE OF PROCEDURE:  02/07/2014  PREOPERATIVE DIAGNOSIS: Left impacted femoral neck hip fracture.   POSTOPERATIVE DIAGNOSIS:  Left impacted femoral neck hip fracture.   PROCEDURE: Percutaneous fixation of left femoral neck hip fracture.   SURGEON: Juanell Fairly, M.D.   ANESTHESIA: General.   ESTIMATED BLOOD LOSS: Minimal.   COMPLICATIONS: None.   IMPLANTS: Synthes 7.3 mm cannulated screws x4.   INDICATIONS FOR PROCEDURE: The patient is a 81 year old female who fell on left side at home. This was a mechanical fall. She had significant pain and was unable to stand following this injury. X-rays in the ER indicated an impacted left femoral neck hip fracture. I had recommended surgical fixation with percutaneous cannulated screws. I reviewed the risks and benefits of the surgery with the patient. She was cleared by the hospitalist service as well as Dr. Juliann Pares from cardiology. The patient had her INR reversed with divided doses of vitamin K. Her INR was 1.4 just prior to surgery. The patient agreed to the procedure understanding of the  risks include infection, bleeding, nerve or blood vessel injury, avascular necrosis, displacement of the fracture, painful hardware or hardware failure and the need for further surgery including conversion to a total hip arthroplasty. Medical risks include persistent left hip pain, deep vein thrombosis and pulmonary embolism, myocardial infarction, stroke, pneumonia, respiratory failure and death. The patient signed the consent form. Her leg was marked according to the hospital's right site protocol.   DESCRIPTION OF PROCEDURE: The patient was brought to the Operating Room. She underwent general anesthesia. She had her left leg placed in a legholder and the right leg was placed in a hemi-lithotomy position. All bony prominences were adequately padded. The patient was prepped and draped in  sterile fashion. A timeout was performed to verify the patient's name, date of birth, medical record number, correct site of surgery and correct procedure to be performed. It was also used to verify the patient had received antibiotics and all appropriate instruments, implants, and radiographic studies were available in the room. Once all in attendance were in agreement, the case began.   C-arm images were taken of the fracture. The patient's leg was gently internally rotated. The fracture was deemed to be stable and in good position on both AP and lateral films prior to the start of the case. C-arm images were used for incision and planning as well. A 2 to 3 cm incision was made over the lateral skin in line with the femur. Subcutaneous tissues were dissected using electrocautery too, and all bleeding vessels were cauterized during the exposure.   A threaded guide pin was then placed percutaneously along the lateral cortex of the femur and advanced into the femur through the lateral cortex, across the fracture site and into the femoral head. Its position was confirmed on AP and lateral C-arm images. Three additional threaded guide pins were also placed in a diamond formation and all positions were confirmed on AP and lateral C-arm images. The guide pins were measured with a depth gauge. They were overdrilled and the appropriate length cannulated 7.3 mm screws were advanced into position and tightened by hand. Final images of the 4 cannulated screw construct were taken in both the AP and lateral planes. All screws were within the femoral head and did not penetrate into the joint. Fracture as well reduced.   The incision was then copiously irrigated. The subcutaneous tissue was  closed with 0 Vicryl and 2-0 Vicryl and the skin approximated with staples. A dry sterile dressing was applied. The patient was then transferred to a hospital bed after being awoken from anesthesia and brought to the PACU in stable  condition. I was scrubbed and present for the entire case, and all sharp and instrument counts were correct at the conclusion of the case.    ____________________________ Kathreen DevoidKevin L. Manolo Bosket, MD klk:kl D: 02/08/2014 12:26:13 ET T: 02/08/2014 12:57:16 ET JOB#: [id number]  cc: Kathreen DevoidKevin L. Delroy Ordway, MD, <Dictator> Kathreen DevoidKEVIN L Ferrin Liebig MD ELECTRONICALLY SIGNED 02/12/2014 17:48

## 2014-07-07 NOTE — Discharge Summary (Signed)
PATIENT NAME:  Tina Barron, Tina Barron MR#:  [id number] DATE OF BIRTH:  [birth year]  DATE OF ADMISSION:  02/06/2014 DATE OF DISCHARGE:  02/13/2014   ADMITTING DIAGNOSIS: Hip fracture.   DISCHARGE DIAGNOSES:  1.  Left femoral neck fracture, status post percutaneous fixation of left femoral neck fracture by Dr. Juanell FairlyKevin Krasinski on 02/07/2014.  2.  Acute posthemorrhagic postoperative anemia requiring transfusion.  3.  History of hypertension and atrial fibrillation on Coumadin anticoagulation with an INR of 2.7 on 02/13/2014.  4.  Mild hypoxia postoperatively, likely atelectasis; resolved.  5.  Major depressive disorder.   DISCHARGE CONDITION: Stable.   DISCHARGE MEDICATIONS:  1.  The patient is to continue metoprolol succinate 25 mg p.o. daily.  2.  Losartan 100 mg p.o. daily.  3.  Alprazolam 0.5 mg at bedtime as needed.  4.  Warfarin 2.5 mg p.o. at bedtime.  5.  Cyanocobalamin 1000 mcg injectable solution once monthly on the fourth of each month.  6.  Multaq 400 mg twice daily.  7.  Tramadol 50 mg every 6 hours as needed.  8.  Tylenol 500 mg 2 tablets every 6 hours as needed.  9.  Calcium with Vitamin D 500 mg /200 international units 1 tablet twice daily with meals.  10.  Iron sulfate 325 mg 1 tablet twice daily.  11.  Bisacodyl 10 mg rectal suppository once at bedtime as needed.  12.  Remeron 15 mg p.o. at bedtime.  13.  Antacid.  14.  Nystatin diphenhydramine mouthwash 5 mL every 6 hours as needed for thrush.  15.  Ensure Plus 237 mL 3 times daily with meals.  16.  Aspirin.   HOME OXYGEN: None.   DIET: Two-gram salt.   DIETARY SUPPLEMENTS: Ensure 3 times daily.   DIET CONSISTENCY: Regular.   ACTIVITY LIMITATIONS: As tolerated.   REFERRALS: To physical therapy as well as dietary.   FOLLOWUP APPOINTMENTS: Dr. Merlinda FrederickMcLaughlin in 2 days after discharge. Dr. Martha ClanKrasinski in 1 week after discharge.   CONSULTANTS: Care management, social work,  Kathreen DevoidKevin L. Krasinski, MD and Dwayne D. Callwood,  MD.  RADIOLOGIC STUDIES: Chest x-ray, 1 view, 02/06/2014 showed no active disease. Left wrist complete x-ray 02/06/2014 revealed no acute findings. Left hip complete x-ray on 02/06/2014 revealed impacted fracture of left femoral neck, presumably acute, possible contour irregularity of left femoral head potentially due to rotation of the fovea. Lumbar spine, AP and lateral x-ray on 02/06/2014 showed no acute findings. Thoracic spine AP and lateral, 02/06/2014, showed no acute findings. CT scan of cervical spine without contrast on 02/06/2014 showed no acute intracranial pathology, no acute osseous injury of cervical spine. Of note, CT scan of the head without contrast was also performed. CT of left hip without contrast done 02/06/2014 revealed impacted subcapital femoral neck fracture, similar appearance of the acetabular surface of the femoral head when compared to 07/17/2013, according to radiology. Left hip x-ray, 1 view, 02/07/2014, after reduction, showed post fixation of previously identified subcapital fracture of left femoral neck. Chest x-ray, PA and lateral, on 02/09/2014, showed new bilateral pleural effusions with associated left greater than right basilar atelectasis or pneumonia.   HOSPITAL COURSE: The patient is a 81 year old Caucasian female with a past medical history significant for a history of hypertension; also atrial fibrillation, who is on Coumadin, presented to the hospital with complaints of fall and hip fracture. Please refer to Dr. Lacie ScottsShreyang H. Patel'Barron admission note on 02/06/2014. On arrival to the hospital, patient was afebrile with a temperature of  98.5, pulse was 75, respiration rate was 24, blood pressure 133/117; oxygen saturations were 92% on room air. The patient'Barron physical examination was unremarkable. The patient'Barron left hip x-ray revealed an impacted fracture of the left femoral neck. Chest x-ray was unremarkable. The patient'Barron lab data done on arrival to the hospital revealed  normal BMP, except for mild elevation of glucose to 106. The patient'Barron CK total was normal at 118. CBC: White blood cell count was 7.9, hemoglobin was 10.2, platelet count was 179,000. Absolute neutrophil count was not checked. Coagulation wise, the patient'Barron pro-time was 27.2 and INR was 2.6, with an activated PTT of 36.0. Urinalysis was unremarkable. EKG showed left ventricular fascicular block, possible anteroseptal infarct, and undetermined rhythm.   The patient was admitted to the hospital for further evaluation and her Coumadin was reversed. She underwent the operation on 02/07/2014 by Dr. Martha Clan. Dr. Martha Clan performed a percutaneous fixation of the left femoral neck hip fracture with Synthes 7.3 mm cannulated screws x4. Postoperatively, the patient did well, except that she had mild hypoxia and poor respiratory effort. She had a chest x-ray done at that point to evaluate her closer; however, no significant abnormalities were found and she was actually asymptomatic except for mild hypoxia. She was slowly weaned off oxygen and did well afterwards. She also had problems with nutrition, oral intake, and she looked very depressed. Consultation with psychiatrist was obtained at that point, and Dr. Toni Amend saw the patient in consultation and felt that the patient would benefit from Remeron at nighttime for what he felt was major depression. The patient and was initiated on Remeron on 02/12/2014. On 02/13/2014, her oral intake was a little bit better and she was ready to be discharged to a rehabilitation facility for management of her left hip fracture.   On the day of discharge, 02/13/2014, temperature was 98.6, pulse was 71, respiration rate was 12 to 18, blood pressure 146/61, saturation was 94% on room air at rest.   TIME SPENT: 40 minutes.    ____________________________ Katharina Caper, MD rv:MT D: 02/13/2014 10:25:25 ET T: 02/13/2014 11:03:25 ET JOB#: [id number]  cc: Katharina Caper, MD,  <Dictator> Dr. Tery Sanfilippo Kathreen Devoid, MD  Markela Wee Winona Legato MD ELECTRONICALLY SIGNED 02/13/2014 14:35

## 2014-07-07 NOTE — Consult Note (Signed)
Brief Consult Note: Diagnosis: major depression.   Patient was seen by consultant.   Consult note dictated.   Recommend further assessment or treatment.   Discussed with Attending MD.   Comments: Psychiatry: Patient seen and chart reviewed and case discussed with Dr Seth BakeV. Agree she has a major depression. Agree with starting mirtazapine 15mg  qhs. Full note dictated.  Electronic Signatures: Audery Amellapacs, Kechia Yahnke T (MD)  (Signed [id number]-Nov-15 21:06)  Authored: Brief Consult Note   Last Updated: 30-Nov-15 21:06 by Audery Amellapacs, Mykeria Garman T (MD)

## 2014-07-07 NOTE — Consult Note (Signed)
Brief Consult Note: Diagnosis: Left femoral neck hip fracture.   Patient was seen by consultant.   Recommend to proceed with surgery or procedure.   Orders entered.   Discussed with Attending MD.   Comments: I have discussed the risks and benefits of surgery with the patient.  I am recommending cannulated screw fixation for her impacted left femoral neck hip fracture.  Discussed case with Dr. Juliann Paresallwood who states she is cleared for surgery from a cardiology standpoint.  Electronic Signatures: Juanell FairlyKrasinski, Kennetta Pavlovic (MD)  (Signed [phone]-Nov-15 13:24)  Authored: Brief Consult Note   Last Updated: 25-Nov-15 13:24 by Juanell FairlyKrasinski, Christo Hain (MD)

## 2014-07-07 NOTE — Consult Note (Signed)
PATIENT NAME:  Tina Barron, Tina Barron MR#:  [id number] DATE OF BIRTH:  1932-07-28  DATE OF CONSULTATION:  02/07/2014  REFERRING PHYSICIAN:   CONSULTING PHYSICIAN:  Alando Colleran D. Juliann Paresallwood, MD  PRIMARY CARE PHYSICIAN:  Mimi McLaughlin, PA-C.    CHIEF COMPLAINT:  Hip fracture, atrial fibrillation with palpitations.   HISTORY OF PRESENT ILLNESS: The patient is a 81 year old white female recently fell in a garage suffering a left hip fracture. The patient has a known history of mitral valve disease, history of atrial fibrillation, palpitations, tachycardia, permanent pacemaker placement.   ALLERGIES: CARDIZEM, HYDROCODONE, PREDNISONE, MSG, SEAFOOD, SHELLFISH.    MEDICATIONS:  Multaq 400 twice a day, metoprolol 25 mg a day, losartan 10 mg a day, B12 1000 mg once a month, aspirin 81 mg a day, alprazolam 0.5 mg at bedtime as needed.   FAMILY HISTORY: Lung cancer.   SOCIAL HISTORY: Does not smoke, does not drink. Denies alcohol consumption.   REVIEW OF SYSTEMS:  No blackout spells or syncope. No nausea or vomiting. No fever. No chills. No weight loss. No weight gain.  No hemoptysis, hematemesis. No bright red blood per rectum. No vision change   sputum production or cough, palpitation, tachycardia.  sputum production and cough  no rash.  PHYSICAL EXAMINATION:  VITAL SIGNS: Blood pressure 130/110, pulse of 75, respiratory rate of 18.  HEENT: Normocephalic, atraumatic. Pupils equal, reactive to light.  NECK:  Nontender and supple. No JVD, bruit or adenopathy.  LUNGS:  Clear to auscultation. No significant wheeze, rhonchi, or rale.  HEART: Regular rate and rhythm.  ABDOMEN:  Positive bowel sounds. No rebound tenderness.  EXTREMITIES: Within normal limits.  NEUROLOGIC: Intact.  SKIN: Normal.   The patient'Barron left leg in traction from hip fracture.   IMAGING: Left hip impacted fracture of the femoral neck.   LABORATORY DATA:  Glucose of 106, BUN of 17, creatinine 1.17, sodium 144, potassium 4.4, chloride  of 112, CO2 28, calcium 8.8. White count of 7.9, hemoglobin 10, platelet count of 179,000. INR 2.6.   ASSESSMENT:  1.  Hip fracture.  2.  Atrial fibrillation.  3.  Permanent pacemaker placement.  4.  Sick sinus syndrome.  5.  Hypertension.  6.  Anxiety.   PLAN:  Agree with vitamin K to correct INR. Recommend continuing metoprolol. Continue Multaq.  The patient should be an acceptable surgical risk for hip surgery. Continue hypertension control. Hold off on anticoagulation for now. Continue antianxiety medications. Do not recommend invasive studies. Will continue current therapy. Continue to recommend vitamin K therapy to help with correction of INR. INR today is 1.7. We will discuss the case with Dr. Margo CommonKrisinski  to hopefully proceed with further therapy and evaluation and hip surgery.    ____________________________ Bobbie Stackwayne D. Juliann Paresallwood, MD ddc:bu D: 02/07/2014 16:13:31 ET T: 02/07/2014 17:05:04 ET JOB#: [id number]  cc: Shealynn Saulnier D. Juliann Paresallwood, MD, <Dictator> Alwyn PeaWAYNE D Manjot Beumer MD ELECTRONICALLY SIGNED 02/16/2014 16:54

## 2014-07-07 NOTE — H&P (Signed)
PATIENT NAME:  Tina Barron, Tina Barron DATE OF BIRTH:  1933/01/31  PRIMARY CARE PROVIDER: Pieter PartridgeMiriam "Mimi" Natale MilchMcLaughlin, PA-C  EMERGENCY DEPARTMENT REFERRING PHYSICIAN: Eryka A. Inocencio HomesGayle, MD  CHIEF COMPLAINT: Fall with hip fracture.   HISTORY OF PRESENT ILLNESS: The patient is a 81 year old white female who was in her garage where she fell. She thinks that she might of tripped over something. She did not feel like she passed out. The patient came to the ED and was noted to have a left femoral neck fracture. The ED physician spoke to the orthopedist, who recommended admission. The patient otherwise does have history of mitral valve prolapse and reports that she has been feeling well. She denies any chest pain or shortness of breath.  Does complain of occasional palpitations.  Denies any fevers, chills. No nausea, vomiting, or diarrhea. Denies any urinary symptoms.   PAST MEDICAL HISTORY: Significant for: 1.  Mitral valve regurgitation, mild to moderate. based on a TEE on September 13.  She has had some sort of procedure to one of the valves. She is not sure.  2.  History of pacemaker placement.  3.  History of atrial fibrillation.  4.  Status post partial hysterectomy. 5.  Status post appendectomy.   ALLERGIES: CARDIZEM, HYDROCORTISONE, PREDNISONE, MSG, SEAFOOD, SHELLFISH.  CURRENT MEDICATIONS AT HOME: She is on Multaq 400 one tab p.o. b.i.d., metoprolol succinate 25 p.o. daily, losartan 100 daily, cyanocobalamin 1000 mcg once a month, aspirin 81 one tab p.o. daily, alprazolam 0.5 one tab p.o. at bedtime as needed for sleep.   FAMILY HISTORY: Father with lung cancer.   SOCIAL HISTORY: Does not smoke. Does not drink.  REVIEW OF SYSTEMS:  CONSTITUTIONAL: Denies fevers, fatigue, weakness. Complains of pain in the hip. No weight loss. No weight gain.  EYES: No blurred or double vision. No redness. No inflammation.  ENT: No tinnitus. No ear pain. No hearing loss. No seasonal or year-round  allergies. No epistaxis. No difficulty swallowing.  RESPIRATORY: Denies any cough, wheezing, hemoptysis. No COPD, no TB.  CARDIOVASCULAR: Denies any chest pain, orthopnea, edema. Has chronic atrial fibrillation.  GASTROINTESTINAL: No nausea, vomiting, diarrhea. No abdominal pain. No hematemesis. No melena. No ulcer. No guarding, no IBS. No jaundice. No rectal bleeding.  GENITOURINARY: Denies any dysuria, hematuria, renal calculus, or frequency.  ENDOCRINE: Denies any polyuria or nocturia. No thyroid problems.  HEMATOLOGIC AND LYMPHATIC:  No anemia, easy bruisability, or bleeding.  SKIN: No acne. No rash.  MUSCULOSKELETAL: Pain in the hip.  NEUROLOGIC: No CVA, TIA, or seizures.  PSYCHIATRIC: No anxiety, insomnia, or ADD.   PHYSICAL EXAMINATION: VITAL SIGNS: Temperature 98.5, pulse 75, respirations 24, blood pressure 133/117, O2 of 92%.  GENERAL: The patient in no acute distress.  HEENT: Head atraumatic, normocephalic. Pupils equally round and reactive to light and accommodation. There is no conjunctival pallor. No scleral icterus. Nasal exam shows no drainage or ulceration.  Oropharynx is clear without any exudate. NECK: Supple without any JVD. No thyromegaly.  CARDIOVASCULAR: Irregularly irregular rhythm.  Positive systolic murmur. No rubs, clicks, or gallops.  LUNGS: Clear to auscultation bilaterally without any rales, rhonchi, wheezing.  ABDOMEN: Soft, nontender, nondistended. Positive bowel sounds x 4.  EXTREMITIES: No clubbing, cyanosis, or edema.  SKIN: No rash.  LYMPH NODES: None palpable.  VASCULAR: Good DP, PT pulses.  PSYCHIATRIC: Not anxious or depressed.  NEUROLOGIC: Awake, alert, and oriented x 3. No focal deficits.   IMAGING:  Left hip x-ray shows impacted fracture of the left  femoral neck presumably acute. Chest x-ray negative.   LABORATORY DATA: Glucose 106, BUN 17, creatinine 1.17, sodium 144, potassium 4.4, chloride 112, CO2 is 24, calcium 8.8. WBC 7.9, hemoglobin 10.2,  platelet count 179,000, INR 2.6.   ASSESSMENT AND PLAN: The patient is a 81 year old white female, history of mitral valve regurgitation, pacemaker, atrial fibrillation, status post fall with hip fracture.  1.  Hip fracture. The patient with atrial fibrillation on chronic anticoagulation. We will hold the Coumadin. Vitamin K x 1. Okay to proceed to operating room once  INR acceptable per orthopedic.  I will ask her cardiologist to come by as well, but no need to wait for them to evaluate.  2.  Atrial fibrillation. We will hold Coumadin and continue metoprolol.  3.  Hypertension. Continue losartan.  4.  Miscellaneous. Recommend Lovenox postoperatively.   TIME SPENT: 45 minutes on this patient.    ____________________________ Lacie Scotts. Allena Katz, MD shp:LT D: 02/06/2014 21:00:52 ET T: 02/06/2014 21:20:05 ET JOB#: [id number]  cc: Denasia Venn H. Allena Katz, MD, <Dictator> Charise Carwin MD ELECTRONICALLY SIGNED 02/11/2014 13:41

## 2014-07-07 NOTE — Consult Note (Signed)
Brief Consult Note: Diagnosis: Pre op clearance /AFIB/CP.   Patient was seen by consultant.   Consult note dictated.   Recommend to proceed with surgery or procedure.   Orders entered.   Discussed with Attending MD.   Comments: IMP Pre op Hip AFIB MV disease SSS CP Anxiety Bradycardia Palpitations . PLAN Patient is an acceptable surgical risk Continue to hold coumadin Agree with low dose metoprolol Benzo prn Continue current care  I will follow with you Rec rehab post op.  Electronic Signatures: Dorothyann Pengallwood, Leliana Kontz D (MD)  (Signed [phone]-Nov-15 11:38)  Authored: Brief Consult Note   Last Updated: 25-Nov-15 11:38 by Alwyn Peaallwood, Anisten Tomassi D (MD)

## 2014-07-07 NOTE — Op Note (Signed)
PATIENT NAME:  Tina Barron, [name] MR#:  [id number] DATE OF BIRTH:  [birth year]  DATE OF PROCEDURE:  12/27/2013  PREOPERATIVE DIAGNOSIS:  Nuclear sclerotic cataract left eye.  ICD-10 H25.12  POSTOPERATIVE DIAGNOSIS:  Nuclear sclerotic cataract left eye.  PROCEDURE:  Phacoemulsification with posterior chamber intraocular lens implantation of the left eye.   LENS:  ZCB00 20.0-diopter posterior chamber intraocular lens.  ULTRASOUND TIME: 14% of 1 minute, 31 seconds.  CDE 13.2  SURGEON:  Italyhad Dariya Gainer, MD  ANESTHESIA:  Topical with tetracaine drops and 2% Xylocaine jelly.  COMPLICATIONS:  None.  DESCRIPTION OF PROCEDURE:  The patient was identified in the holding room and transported to the operating room and placed in the supine position under the operating microscope.  The left eye was identified as the operative eye and it was prepped and draped in the usual sterile ophthalmic fashion.  A 1 millimeter clear-corneal paracentesis was made at the 1:30 position.  The anterior chamber was filled with Viscoat viscoelastic.  A 2.4 millimeter keratome was used to make a near-clear corneal incision at the 10:30 position.  A curvilinear capsulorrhexis was made with a cystotome and capsulorrhexis forceps.  Balanced salt solution was used to hydrodissect and hydrodelineate the nucleus.  Phacoemulsification was then used in stop and chop fashion to remove the lens nucleus and epinucleus.  The remaining cortex was then removed using the irrigation and aspiration handpiece. Provisc was then placed into the capsular bag to distend it for lens placement.  A ZCB00 20.0-diopter lens was then injected into the capsular bag.  The remaining viscoelastic was aspirated.  Wounds were hydrated with balanced salt solution.  The anterior chamber was inflated to a physiologic pressure with balanced salt solution,  0.1 mL of Cefuroxime 10 mg/mL were injected into the anterior chamber for a dose of 1 mg of intracameral  antibiotic at the completion of the case. Miostat was placed into the anterior chamber to constrict the pupil.  No wound leaks were noted.  Topical Vigamox drops and Maxitrol ointment were applied to the eye.  The patient was taken to the recovery room in stable condition without complications of anesthesia or surgery.   ____________________________ Deirdre Evenerhadwick R. Earlee Herald, MD crb:TT D: 12/27/2013 16:01:19 ET T: 12/27/2013 16:18:17 ET JOB#: [id number]  cc: Deirdre Evenerhadwick R. Fanny Agan, MD, <Dictator> Lockie MolaHADWICK Rylea Selway MD ELECTRONICALLY SIGNED 12/28/2013 8:06

## 2014-07-07 NOTE — Consult Note (Signed)
PATIENT NAME:  Tina Barron, Tina Barron MR#:  [id number] DATE OF BIRTH:  [birth year]  DATE OF CONSULTATION:  02/12/2014  CONSULTING PHYSICIAN:  Audery Amel, MD  IDENTIFYING INFORMATION AND REASON FOR CONSULTATION: A 81 year old woman currently in the hospital for repair of a hip fracture.  Consultation for depression.   HISTORY OF PRESENT ILLNESS: Information obtained from the patient and the chart. The patient's granddaughter was in the room as well during the interview.  The patient reports that prior to breaking her leg, she was very functional at home. Was able to take care of herself, clean the house, still get around and do her housecleaning. She is very concerned about now not being able to return to those activities. She admits, however, that she has actually been depressed for years. Depressed mood probably goes back almost to the death of her husband 4 years ago. Has been getting worse over the last year. She sleeps poorly, only a couple of hours a night, with frequent awakening. She has not been interested in eating and has lost a few pounds over the last several months. She has felt isolated and alone. Feels some degree of hopelessness about her situation. She denies any psychotic symptoms. She has had occasional passive suicidal thoughts, but has never had any actual thoughts of harming herself. She has not been seeking out any treatment from her primary caregiver or any other professional for her depression.   PAST PSYCHIATRIC HISTORY: She reports that she was prescribed Paxil in the past and that it caused her to have itching or irritability or poor sleep. She is not completely clear. She says that she is very "sensitive" to medications and that she tends to get a lot of side effects. She is somewhat resistant to the idea therefore of trying medication for depression. She does take low doses of Xanax for sleep. No history of psychiatric hospitalization. No history of suicidal behavior. No history of  mania.   FAMILY HISTORY: No known family history of any mental illness.   SUBSTANCE ABUSE HISTORY: Does not drink, does not abuse drugs. No known history of substance abuse.   SOCIAL HISTORY: The patient is a widow. Her husband having died about 4 years ago. They were married for about 46 years. She does have several adult children as well as grandchildren and even great-grandchildren in the area and enjoys seeing them, but feels like she does not visit them or get visits from them as much as she would like. 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 Feels like she does not have any friends. Does not have much social contact outside the house.   PAST MEDICAL HISTORY: The patient has an artificial heart valve. Also has had surgery on  another one of her valves. Has atrial fibrillation and is on chronic anticoagulant therapy, but otherwise is in pretty good health. No history of myocardial infarction or stroke is known.   CURRENT MEDICATIONS: Calcium 500 mg twice a day, Multaq 400 mg twice a day, iron 325 mg once a day, metoprolol 12.5 mg twice a day, losartan 100 mg a day, warfarin 0.5 mg at 5:00 p.m., alprazolam 0.5 mg at night as needed for anxiety and sleep, bisacodyl p.r.n., tramadol p.r.n.   ALLERGIES: CARDIZEM, HYDROCORTISONE, PREDNISOLONE, MSG, SEAFOOD.   REVIEW OF SYSTEMS: Complains of depressed mood, lack of energy, hopelessness, slowed thinking. Denies suicidal intent. Denies hallucinations. Having a fair bit of pain still in her  leg.   MENTAL STATUS EXAMINATION: Very neatly groomed woman, looks her stated age, cooperative with the interview. Good eye contact. Appears to be forthcoming. Speech is quiet, slow, and decreased in amount. Affect is blunted. Mood is stated as depressed. Thoughts are a little bit slow, but lucid and clear. No evidence of hallucinations. Denies any hallucinations. Denies suicidal intent or plan, although has some occasional  hopelessness or passive wishes to give up. She is alert and oriented to her situation and the place, although she got the year wrong by one year. She could remember 3/3 objects immediately, but only 1/3 at 3 minutes. Did not do the full screening for dementia. Judgment and insight slightly impaired it seems by her pain and somewhat by her hopelessness.   LABORATORY RESULTS: Multiple laboratories have been done since admission. Chloride slightly elevated on admission, but her creatinine and BUN were normal. Glucose only slightly up at 106. I do not see that a thyroid panel or screen done which is probably worth checking.   VITAL SIGNS: Blood pressure currently 136/70, respirations 18, pulse 66, temperature 98.5.   ASSESSMENT: A 81 year old woman with major depression, passive suicidality. Admits that she has not had much motivation to do her physical therapy. Seems to be taking a somewhat passive and hopeless approach to her condition. Family feel that this is not normal for her. Certainly given the rest of her health, it would be more appropriate for her to be optimistic about the outcome of the surgery. I think the patient needs to have her depression treated aggressively to improve the chances of a good outcome.   TREATMENT PLAN: I spent some time doing some education with her, strongly encouraging her to put her best effort into the physical therapy, and reviewing the pros and cons of going to a  rehabilitation. Educated her about be treatability of depression. Educated her about side effects related to potential medication. I agree with the hospitalist who has started her on mirtazapine 15 mg at night. Usual antidepressant dose is 30 to 45 mg at night, but the patient reported sensitivity to medication. I suggest we should start at a low dose. The patient was educated that the medicine is likely to make her sleepy initially, but that may improve with time. I will follow up as needed. She will need to be  monitored with the depression in mind by providers after discharge. Additionally, I am going to request that social work see if they could make an appointment for her to see a local provider for depression treatment.   DIAGNOSIS, PRINCIPAL AND PRIMARY:  AXIS I: Major depression, moderate, single.   SECONDARY DIAGNOSES: AXIS I: No further diagnosis.  AXIS II: No diagnosis.  AXIS III: A broken hip, atrial fibrillation, chronic anticoagulant therapy.   ____________________________ Audery AmelJohn T. Clapacs, MD jtc:LT D: 02/12/2014 21:28:34 ET T: 02/12/2014 21:53:14 ET JOB#: [id number]  cc: Audery AmelJohn T. Clapacs, MD, <Dictator> Audery AmelJOHN T CLAPACS MD ELECTRONICALLY SIGNED 02/23/2014 19:09

## 2014-07-08 NOTE — Op Note (Signed)
PATIENT NAME:  Tina Barron, Tina Barron MR#:  [id number] DATE OF BIRTH:  12/03/1932  DATE OF PROCEDURE:  03/25/2011  PREOPERATIVE DIAGNOSIS:  Senile cataract right eye.  POSTOPERATIVE DIAGNOSIS:  Senile cataract right eye.  PROCEDURE:  Phacoemulsification with posterior chamber intraocular lens implantation of the right eye.  LENS:  ZCB00 20.0 diopter posterior chamber intraocular lens.  ULTRASOUND TIME:  17% of one minute, thirteen seconds. CDE 12.5.   SURGEON:  Italyhad Kahlee Metivier, MD  ANESTHESIA:  Topical with tetracaine drops and 2% Xylocaine jelly.  COMPLICATIONS:  None.  DESCRIPTION OF PROCEDURE:  The patient was identified in the holding room and transported to the operating room and placed in the supine position under the operating microscope.  The right eye was identified as the operative eye and it was prepped and draped in the usual sterile ophthalmic fashion.  A 1 millimeter clear-corneal paracentesis was made at the 1:30 position.  The anterior chamber was filled with Viscoat viscoelastic.  A 2.4 millimeter keratome was used to make a near-clear corneal incision at the 10:30 position.  A curvilinear capsulorrhexis was made with a cystotome and capsulorrhexis forceps.  Balanced salt solution was used to hydrodissect and hydrodelineate the nucleus.  Phacoemulsification was then used in horizontal chopping fashion to remove the lens nucleus and epinucleus.  The remaining cortex was then removed using the irrigation and aspiration handpiece. Provisc was then placed into the capsular bag to distend it for lens placement.  An ZCB00 20.0-diopter lens was then injected into the capsular bag.  The remaining viscoelastic was aspirated.  Wounds were hydrated with balanced salt solution.  The anterior chamber was inflated to a physiologic pressure with balanced salt solution.  Miostat was placed into the anterior chamber to constrict the pupil.  No wound leaks were noted.  Topical Vigamox drops and  Maxitrol ointment were applied to the eye. The patient was taken to the recovery room in stable condition without complications of anesthesia or surgery.   ____________________________ Deirdre Evenerhadwick R. Ahyana Skillin, MD crb:ap D: 03/25/2011 15:47:12 ET T: 03/26/2011 08:25:26 ET JOB#: [id number]  cc: Deirdre Evenerhadwick R. Darcel Frane, MD, <Dictator> Lockie MolaHADWICK Tommi Crepeau MD ELECTRONICALLY SIGNED 03/28/2011 12:58

## 2014-11-01 DIAGNOSIS — I10 Essential (primary) hypertension: Secondary | ICD-10-CM | POA: Insufficient documentation

## 2014-11-06 DIAGNOSIS — I480 Paroxysmal atrial fibrillation: Secondary | ICD-10-CM | POA: Insufficient documentation

## 2016-01-14 ENCOUNTER — Ambulatory Visit: Payer: Self-pay

## 2016-01-22 ENCOUNTER — Encounter: Payer: Self-pay | Admitting: Internal Medicine

## 2016-01-22 ENCOUNTER — Inpatient Hospital Stay: Payer: Medicare Other | Attending: Internal Medicine | Admitting: Internal Medicine

## 2016-01-22 ENCOUNTER — Inpatient Hospital Stay: Payer: Medicare Other

## 2016-01-22 DIAGNOSIS — I482 Chronic atrial fibrillation, unspecified: Secondary | ICD-10-CM

## 2016-01-22 DIAGNOSIS — I480 Paroxysmal atrial fibrillation: Secondary | ICD-10-CM | POA: Insufficient documentation

## 2016-01-22 DIAGNOSIS — F419 Anxiety disorder, unspecified: Secondary | ICD-10-CM | POA: Insufficient documentation

## 2016-01-22 DIAGNOSIS — K449 Diaphragmatic hernia without obstruction or gangrene: Secondary | ICD-10-CM | POA: Diagnosis not present

## 2016-01-22 DIAGNOSIS — I495 Sick sinus syndrome: Secondary | ICD-10-CM | POA: Insufficient documentation

## 2016-01-22 DIAGNOSIS — D649 Anemia, unspecified: Secondary | ICD-10-CM | POA: Insufficient documentation

## 2016-01-22 DIAGNOSIS — R531 Weakness: Secondary | ICD-10-CM | POA: Diagnosis not present

## 2016-01-22 DIAGNOSIS — F329 Major depressive disorder, single episode, unspecified: Secondary | ICD-10-CM | POA: Insufficient documentation

## 2016-01-22 DIAGNOSIS — D509 Iron deficiency anemia, unspecified: Secondary | ICD-10-CM | POA: Diagnosis not present

## 2016-01-22 DIAGNOSIS — K589 Irritable bowel syndrome without diarrhea: Secondary | ICD-10-CM | POA: Insufficient documentation

## 2016-01-22 DIAGNOSIS — I2510 Atherosclerotic heart disease of native coronary artery without angina pectoris: Secondary | ICD-10-CM | POA: Insufficient documentation

## 2016-01-22 DIAGNOSIS — M1610 Unilateral primary osteoarthritis, unspecified hip: Secondary | ICD-10-CM | POA: Insufficient documentation

## 2016-01-22 DIAGNOSIS — M797 Fibromyalgia: Secondary | ICD-10-CM | POA: Diagnosis not present

## 2016-01-22 DIAGNOSIS — I38 Endocarditis, valve unspecified: Secondary | ICD-10-CM | POA: Insufficient documentation

## 2016-01-22 DIAGNOSIS — E039 Hypothyroidism, unspecified: Secondary | ICD-10-CM | POA: Diagnosis not present

## 2016-01-22 DIAGNOSIS — M8580 Other specified disorders of bone density and structure, unspecified site: Secondary | ICD-10-CM | POA: Insufficient documentation

## 2016-01-22 DIAGNOSIS — I10 Essential (primary) hypertension: Secondary | ICD-10-CM | POA: Diagnosis not present

## 2016-01-22 DIAGNOSIS — I4891 Unspecified atrial fibrillation: Secondary | ICD-10-CM | POA: Insufficient documentation

## 2016-01-22 DIAGNOSIS — R5383 Other fatigue: Secondary | ICD-10-CM | POA: Insufficient documentation

## 2016-01-22 LAB — IRON AND TIBC
Iron: 27 ug/dL — ABNORMAL LOW (ref 28–170)
SATURATION RATIOS: 6 % — AB (ref 10.4–31.8)
TIBC: 475 ug/dL — AB (ref 250–450)
UIBC: 448 ug/dL

## 2016-01-22 LAB — SEDIMENTATION RATE: SED RATE: 94 mm/h — AB (ref 0–30)

## 2016-01-22 LAB — FERRITIN: FERRITIN: 7 ng/mL — AB (ref 11–307)

## 2016-01-22 LAB — RETICULOCYTES
RBC.: 2.93 MIL/uL — AB (ref 3.80–5.20)
Retic Count, Absolute: 35.2 10*3/uL (ref 19.0–183.0)
Retic Ct Pct: 1.2 % (ref 0.4–3.1)

## 2016-01-22 LAB — PROTIME-INR
INR: 2.67
Prothrombin Time: 29 seconds — ABNORMAL HIGH (ref 11.4–15.2)

## 2016-01-22 NOTE — Progress Notes (Signed)
Patient was last seen at this office in 2015 and she also received iron infusions at that time.  She switched to oral iron but states that it does not help and she felt better, symptoms of fatigue,  while receiving the infusions.

## 2016-01-23 LAB — MULTIPLE MYELOMA PANEL, SERUM
ALBUMIN SERPL ELPH-MCNC: 3.5 g/dL (ref 2.9–4.4)
ALBUMIN/GLOB SERPL: 1.1 (ref 0.7–1.7)
Alpha 1: 0.3 g/dL (ref 0.0–0.4)
Alpha2 Glob SerPl Elph-Mcnc: 0.8 g/dL (ref 0.4–1.0)
B-Globulin SerPl Elph-Mcnc: 1.3 g/dL (ref 0.7–1.3)
GAMMA GLOB SERPL ELPH-MCNC: 1 g/dL (ref 0.4–1.8)
GLOBULIN, TOTAL: 3.4 g/dL (ref 2.2–3.9)
IGA: 307 mg/dL (ref 64–422)
IgG (Immunoglobin G), Serum: 948 mg/dL (ref 700–1600)
IgM, Serum: 50 mg/dL (ref 26–217)
Total Protein ELP: 6.9 g/dL (ref 6.0–8.5)

## 2016-01-28 ENCOUNTER — Inpatient Hospital Stay: Payer: Medicare Other

## 2016-01-28 VITALS — BP 135/81 | HR 66 | Temp 98.1°F | Resp 16

## 2016-01-28 DIAGNOSIS — D508 Other iron deficiency anemias: Secondary | ICD-10-CM

## 2016-01-28 DIAGNOSIS — D509 Iron deficiency anemia, unspecified: Secondary | ICD-10-CM | POA: Diagnosis not present

## 2016-01-28 MED ORDER — SODIUM CHLORIDE 0.9 % IV SOLN
510.0000 mg | Freq: Once | INTRAVENOUS | Status: AC
  Administered 2016-01-28: 510 mg via INTRAVENOUS
  Filled 2016-01-28: qty 17

## 2016-01-28 MED ORDER — SODIUM CHLORIDE 0.9 % IV SOLN
Freq: Once | INTRAVENOUS | Status: AC
  Administered 2016-01-28: 14:00:00 via INTRAVENOUS
  Filled 2016-01-28: qty 1000

## 2016-02-04 ENCOUNTER — Inpatient Hospital Stay: Payer: Medicare Other

## 2016-02-04 VITALS — BP 160/72 | HR 70 | Temp 96.8°F | Resp 20

## 2016-02-04 DIAGNOSIS — D508 Other iron deficiency anemias: Secondary | ICD-10-CM

## 2016-02-04 DIAGNOSIS — D509 Iron deficiency anemia, unspecified: Secondary | ICD-10-CM | POA: Diagnosis not present

## 2016-02-04 MED ORDER — SODIUM CHLORIDE 0.9 % IV SOLN
510.0000 mg | Freq: Once | INTRAVENOUS | Status: AC
  Administered 2016-02-04: 510 mg via INTRAVENOUS
  Filled 2016-02-04: qty 17

## 2016-02-04 MED ORDER — SODIUM CHLORIDE 0.9 % IV SOLN
Freq: Once | INTRAVENOUS | Status: AC
  Administered 2016-02-04: 14:00:00 via INTRAVENOUS
  Filled 2016-02-04: qty 1000

## 2016-02-05 ENCOUNTER — Ambulatory Visit: Payer: Medicare Other

## 2016-02-13 NOTE — Progress Notes (Signed)
Tina Barron  [phone] [phone]  ID: Verdell Face OB: [birth year]  MR#: [id number]  LZJ#:[id number]  Patient Care Team: Marinda Elk, MD as PCP - General (Physician Assistant)  CHIEF COMPLAINT: New Evaluation and Anemia   HPI: This is a pleasnat 82 yr old  lady who is known to have Iron deficieincy anemia. Is referred for a recently noted Hgb at 9. She notes that back in 2015 she apparently recvied Iron infusions because she could not tolerate oral Iron. She curently follows with cadiolgoy for Paroxysmal  Afb, Tricupid valve insuffciency,  She is on Multq and couamdin She has a permaanet pacemaker. Her chart states she has a prosthetic valve, but details not known  She has had no bleeding, melena or change in bowel habits, no hematuria   She reporrtedly had an EGd and a colonsosopcy in 2013, with removal of benign adenomas   Results review ( External and Internal): A cbc from [facility] system shows hgb at 9.6 on 01/01/16,   A year ago, in 12/2014, she was at 10.4, and 8.6 in 2015,  wbc and platet counts have been normal. MCV was normal Last Iron panel I could see was from 12/2013 when Ferriitn was 9, iron serum was 22, sat was 4%  Her cr Cl was 53, rest of CMP was unremarkable    Lab Results  Component Value Date   NA 143 02/12/2014   K 4.2 02/12/2014   CL 109 (H) 02/12/2014   CO2 30 02/12/2014   GLUCOSE 91 02/12/2014   BUN 19 (H) 02/12/2014   CREATININE 0.82 02/12/2014   CALCIUM 8.6 02/12/2014   PROT 8.1 07/17/2013   ALBUMIN 3.8 07/17/2013   AST 21 07/17/2013   ALT 13 07/17/2013   ALKPHOS 75 07/17/2013   BILITOT 1.0 07/17/2013   GFRNONAA >60 02/12/2014   GFRAA >60 02/12/2014    Lab Results  Component Value Date   WBC 6.2 02/13/2014   NEUTROABS 4.3 02/13/2014   HGB 9.1 (L) 02/13/2014   HCT 28.1 (L) 02/13/2014   MCV 98 02/13/2014   PLT 186 02/13/2014    REVIEW OF SYSTEMS:   Review of Systems    Constitutional: Positive for malaise/fatigue.  HENT: Negative.   Eyes: Negative.   Respiratory: Negative.   Cardiovascular: Negative.   Gastrointestinal: Negative.   Genitourinary: Negative.   Musculoskeletal: Negative.   Skin: Negative.   Neurological: Negative.   Psychiatric/Behavioral: Negative.   All other systems reviewed and are negative.   As per HPI. Otherwise, a complete review of systems is negative.  PAST MEDICAL HISTORY: Past Medical History:  Diagnosis Date  . Anxiety and depression   . Atherosclerosis   . B12 deficiency   . Degenerative joint disease (DJD) of hip   . Fibromyalgia syndrome   . HH (hiatus hernia)   . Hypertension   . IBS (irritable bowel syndrome)   . Osteopenia   . Paroxysmal a-fib ([place])   . Sinoatrial node dysfunction ([facility])    S/p pacemaker implant 04/17/08. Atrial lead revision 06/08/08    . Valvular heart disease    S/p tricuspid valve replacement with a 12m bioprosthesis and mitral valve repair with St Jude SARP26 and epicardial modified MAZE procedure on 10/28/06      PAST SURGICAL HISTORY: Past Surgical History:  Procedure Laterality Date  . APPENDECTOMY  1952  . CARDIAC VALVE REPLACEMENT    . LUMBAR LAMINECTOMY  1989  . PARTIAL HYSTERECTOMY  1972  .  PLACEMENT OF BREAST IMPLANTS  1976  . REPLACEMENT TOTAL HIP W/  RESURFACING IMPLANTS  2015    FAMILY HISTORY: No family history on file.  Allergies  Allergen Reactions  . Amlodipine Itching  . Diltiazem Hcl Hives  . Duloxetine Other (See Comments)    Hyperactivity   . Other Other (See Comments) and Hives    Uncoded Allergy. Allergen: Shellfish Uncoded Allergy. Allergen: Msg  . Penicillin G Itching  . Hydrocortisone Rash  . Prednisone Rash    Vitals:   01/22/16 1527  BP: (!) 158/74  Pulse: 72  Resp: 16  Temp: 97.1 F (36.2 C)     There is no height or weight on file to calculate BMI.   There is no height or weight on file to calculate BSA.    Physical Exam   Constitutional: She is oriented to person, place, and time. She appears well-developed and well-nourished.  HENT:  Head: Normocephalic and atraumatic.  Cardiovascular:  irregualr rhtytm  Pulmonary/Chest: Effort normal and breath sounds normal. No respiratory distress.  Abdominal: Soft. She exhibits no mass.  Musculoskeletal: She exhibits no edema.  Lymphadenopathy:    She has no cervical adenopathy.  Neurological: She is alert and oriented to person, place, and time.  Skin: There is pallor.  Psychiatric: She has a normal mood and affect. Her behavior is normal.    Impression and plan:  Chronic iron deficiency  Cause unknown BEGIN iv iron, potential sideeffects of IV iron infusion have been discussed,She agress Rule out other concurrent causes of anemia,see belwo for orders  She requested to draw a PT/INR for her upcmoign PCP visit adjust couamdin dose  Given her cardiac history, we should try and maiantain her hgb at or above 10. Also keeping her age and comorbidities in mind, and the chronciity of anemia, invasive work up for source of blood loss is not likely to help her overall QOL unless she were to drop quickly and drastically.  Orders Placed This Encounter  Procedures  . Iron and TIBC    Standing Status:   Future    Number of Occurrences:   1    Standing Expiration Date:   01/21/2017  . Ferritin    Standing Status:   Future    Number of Occurrences:   1    Standing Expiration Date:   01/21/2017  . Reticulocytes  . Multiple Myeloma Panel (SPEP&IFE w/QIG)    Standing Status:   Future    Number of Occurrences:   1    Standing Expiration Date:   01/21/2017  . Sedimentation rate    Standing Status:   Future    Number of Occurrences:   1    Standing Expiration Date:   01/21/2017  . Protime-INR    Standing Status:   Future    Number of Occurrences:   1    Standing Expiration Date:   01/21/2017    Return in about 2 weeks (around 02/05/2016).   Patient expressed  understanding and was in agreement with this plan. She also understands that She can call clinic at any time with any questions, concerns, or complaints.       This note was generated in part with voice recognition software and I apologize for any typographical errors that were not detected and corrected.    Creola Corn, MD   02/13/2016 9:44 AM

## 2016-03-04 ENCOUNTER — Ambulatory Visit: Payer: Medicare Other

## 2016-07-30 DIAGNOSIS — M5416 Radiculopathy, lumbar region: Secondary | ICD-10-CM | POA: Insufficient documentation

## 2016-07-30 DIAGNOSIS — M79605 Pain in left leg: Secondary | ICD-10-CM | POA: Insufficient documentation

## 2016-08-05 ENCOUNTER — Other Ambulatory Visit: Payer: Self-pay | Admitting: Orthopedic Surgery

## 2016-08-05 DIAGNOSIS — M5416 Radiculopathy, lumbar region: Secondary | ICD-10-CM

## 2016-08-14 ENCOUNTER — Ambulatory Visit
Admission: RE | Admit: 2016-08-14 | Discharge: 2016-08-14 | Disposition: A | Discharge status: Home / Self Care | Payer: Medicare Other | Source: Ambulatory Visit | Attending: Orthopedic Surgery | Admitting: Orthopedic Surgery

## 2016-08-14 DIAGNOSIS — M47896 Other spondylosis, lumbar region: Secondary | ICD-10-CM | POA: Insufficient documentation

## 2016-08-14 DIAGNOSIS — M5416 Radiculopathy, lumbar region: Secondary | ICD-10-CM | POA: Diagnosis not present

## 2016-08-21 ENCOUNTER — Ambulatory Visit (INDEPENDENT_AMBULATORY_CARE_PROVIDER_SITE_OTHER): Payer: Medicare Other | Admitting: Vascular Surgery

## 2016-08-21 ENCOUNTER — Encounter (INDEPENDENT_AMBULATORY_CARE_PROVIDER_SITE_OTHER): Payer: Self-pay | Admitting: Vascular Surgery

## 2016-08-21 VITALS — BP 150/76 | HR 67 | Resp 15 | Ht 67.0 in | Wt 137.0 lb

## 2016-08-21 DIAGNOSIS — I482 Chronic atrial fibrillation, unspecified: Secondary | ICD-10-CM

## 2016-08-21 DIAGNOSIS — I10 Essential (primary) hypertension: Secondary | ICD-10-CM | POA: Insufficient documentation

## 2016-08-21 DIAGNOSIS — M79605 Pain in left leg: Secondary | ICD-10-CM | POA: Diagnosis not present

## 2016-08-21 DIAGNOSIS — I700 Atherosclerosis of aorta: Secondary | ICD-10-CM | POA: Diagnosis not present

## 2016-08-21 DIAGNOSIS — M79606 Pain in leg, unspecified: Secondary | ICD-10-CM | POA: Insufficient documentation

## 2016-08-21 NOTE — Assessment & Plan Note (Signed)
blood pressure control important in reducing the progression of atherosclerotic disease. On appropriate oral medications.  

## 2016-08-21 NOTE — Progress Notes (Signed)
Patient ID: Tina Barron, female   DOB: 06-18-1932, 83 y.o.   MRN: [id number]  Chief Complaint  Patient presents with  . New Evaluation    Left leg pain    HPI Tina Barron is a 83 y.o. female.  I am asked to see the patient by Dr. Martha Clan for evaluation of leg pain.  The patient reports left hip pain. She broke her hip 2 years ago and had surgery for this. Despite an excellent repair, she continues to have pain in her left hip and buttock area. She has had multiple evaluations and a recent CT scan and x-ray showed significant aortoiliac calcification. She brings a copy of her plain x-ray today that I have reviewed with her and that she does have pretty diffuse calcification of her aorta and iliac vessels. She does not have any history of nonhealing ulceration. She does not really have a lot of thigh or calf pain with activity. She does not describe much in the way of swelling. She has no fever or chills. She denies chest pain or shortness of breath.   Past Medical History:  Diagnosis Date  . Anxiety and depression   . Atherosclerosis   . B12 deficiency   . Degenerative joint disease (DJD) of hip   . Fibromyalgia syndrome   . HH (hiatus hernia)   . Hypertension   . IBS (irritable bowel syndrome)   . Osteopenia   . Paroxysmal A-fib (HCC)   . Sinoatrial node dysfunction (HCC)    S/p pacemaker implant 04/17/08. Atrial lead revision 06/08/08    . Valvular heart disease    S/p tricuspid valve replacement with a 29mm bioprosthesis and mitral valve repair with St Jude SARP26 and epicardial modified MAZE procedure on 10/28/06      Past Surgical History:  Procedure Laterality Date  . APPENDECTOMY  1952  . CARDIAC VALVE REPLACEMENT    . LUMBAR LAMINECTOMY  1989  . PARTIAL HYSTERECTOMY  1972  . PLACEMENT OF BREAST IMPLANTS  1976  . REPLACEMENT TOTAL HIP W/  RESURFACING IMPLANTS  2015    Family History No bleeding disorders, clotting disorders, autoimmune diseases, or  aneurysms  Social History Social History  Substance Use Topics  . Smoking status: Never Smoker  . Smokeless tobacco: Never Used  . Alcohol use Not on file  No IVDU  Allergies  Allergen Reactions  . Amlodipine Itching  . Diltiazem Hcl Hives  . Duloxetine Other (See Comments)    Hyperactivity   . Other Other (See Comments) and Hives    Uncoded Allergy. Allergen: Shellfish Uncoded Allergy. Allergen: Msg  . Penicillin G Itching  . Shellfish Allergy   . Hydrocortisone Rash  . Prednisone Rash    Current Outpatient Prescriptions  Medication Sig Dispense Refill  . acetaminophen (TYLENOL) 500 MG tablet Take 500 mg by mouth every 6 (six) hours as needed.    . chlorhexidine (PERIDEX) 0.12 % solution     . losartan (COZAAR) 100 MG tablet Take by mouth.    . metoprolol succinate (TOPROL-XL) 25 MG 24 hr tablet     . MULTAQ 400 MG tablet     . warfarin (COUMADIN) 2.5 MG tablet Take by mouth.    . warfarin (COUMADIN) 3 MG tablet TAKE 1 TABLET (3 MG TOTAL) BY MOUTH ONCE DAILY.     No current facility-administered medications for this visit.       REVIEW OF SYSTEMS (Negative unless checked)  Constitutional: [] Weight loss  [] Fever  []   Chills Cardiac: [] Chest pain   [] Chest pressure   [x] Palpitations   [] Shortness of breath when laying flat   [] Shortness of breath at rest   [] Shortness of breath with exertion. Vascular:  [x] Pain in legs with walking   [x] Pain in legs at rest   [] Pain in legs when laying flat   [] Claudication   [] Pain in feet when walking  [] Pain in feet at rest  [] Pain in feet when laying flat   [] History of DVT   [] Phlebitis   [] Swelling in legs   [] Varicose veins   [] Non-healing ulcers Pulmonary:   [] Uses home oxygen   [] Productive cough   [] Hemoptysis   [] Wheeze  [] COPD   [] Asthma Neurologic:  [] Dizziness  [] Blackouts   [] Seizures   [] History of stroke   [] History of TIA  [] Aphasia   [] Temporary blindness   [] Dysphagia   [] Weakness or numbness in arms   [] Weakness or  numbness in legs Musculoskeletal:  [x] Arthritis   [] Joint swelling   [] Joint pain   [] Low back pain Hematologic:  [] Easy bruising  [] Easy bleeding   [] Hypercoagulable state   [] Anemic  [] Hepatitis Gastrointestinal:  [] Blood in stool   [] Vomiting blood  [x] Gastroesophageal reflux/heartburn   [] Abdominal pain Genitourinary:  [] Chronic kidney disease   [] Difficult urination  [] Frequent urination  [] Burning with urination   [] Hematuria Skin:  [] Rashes   [] Ulcers   [] Wounds Psychological:  [x] History of anxiety   [x]  History of major depression.    Physical Exam BP (!) 150/76 (BP Location: Left Arm)   Pulse 67   Resp 15   Ht 5\' 7"  (1.702 m)   Wt 137 lb (62.1 kg)   BMI 21.46 kg/m  Gen:  WD/WN, NAD. Appears younger than stated age Head: [place]/AT, No temporalis wasting. Ear/Nose/Throat: Hearing grossly intact, nares w/o erythema or drainage, oropharynx w/o Erythema/Exudate Eyes: Conjunctiva clear, sclera non-icteric  Neck: trachea midline.  No JVD.  Pulmonary:  Good air movement, respirations not labored, no use of accessory muscles Cardiac: Irregularly irregular. Vascular:  Vessel Right Left  Radial Palpable Palpable                  Femoral Palpable Palpable  Popliteal 1+ Palpable 1+ Palpable  PT 1+ Palpable Palpable  DP Palpable 1+ Palpable   Gastrointestinal: soft, non-tender/non-distended.  Musculoskeletal: M/S 5/5 throughout.  Extremities without ischemic changes.  No deformity or atrophy. Neurologic: Sensation grossly intact in extremities.  Symmetrical.  Speech is fluent. Motor exam as listed above. Psychiatric: Judgment intact, Mood & affect appropriate for pt's clinical situation.   Radiology Ct Lumbar Spine Wo Contrast  Result Date: 08/14/2016 CLINICAL DATA:  Mid low back pain radiating down both legs. EXAM: CT LUMBAR SPINE WITHOUT CONTRAST TECHNIQUE: Multidetector CT imaging of the lumbar spine was performed without intravenous contrast administration. Multiplanar CT  image reconstructions were also generated. COMPARISON:  CT abdomen 07/17/2013 FINDINGS: Segmentation: 5 lumbar type vertebrae. Alignment: Normal. Vertebrae: No acute fracture or focal pathologic process. Osteoarthritis of bilateral sacroiliac joints. Paraspinal and other soft tissues: No paraspinal abnormality. Abdominal aortic atherosclerosis. Trace right pleural effusion. Small left pleural effusion. Hypodense, fluid attenuating 2 cm right hepatic mass most consistent with a cyst. Disc levels: Degenerative disc disease with disc height loss at L2-3, L3-4, L4-5 and L5-S1. At L1-2 there is mild bilateral facet arthropathy. At L2-3 there is a broad-based disc osteophyte complex. Mild bilateral facet arthropathy. No foraminal stenosis. At L3-4 there is a mild broad-based disc osteophyte complex. There is mild bilateral  facet arthropathy. No foraminal stenosis. At L4-5 there is a broad-based disc osteophyte complex. Moderate bilateral facet arthropathy. No foraminal stenosis. At L5-S1 there is a broad-based disc osteophyte complex. Mild bilateral facet arthropathy. No significant foraminal stenosis. IMPRESSION: 1.  No acute osseous injury of the lumbar spine. 2. Diffuse lumbar spine spondylosis as described above. Electronically Signed   By: Elige KoHetal  Patel   On: 08/14/2016 15:36    Labs No results found for this or any previous visit (from the past 2160 hour(s)).  Assessment/Plan:  Essential hypertension, benign blood pressure control important in reducing the progression of atherosclerotic disease. On appropriate oral medications.   Atrial fibrillation (HCC) On anticoagulation  Leg pain Her pain is not entirely clear in its etiology. Certainly musculoskeletal or neurologic issues are primarily but given the appearance of her aorta on imaging studies atherosclerotic disease has to be considered. Her symptoms are not typical for claudication, but at times symptoms can be atypical and I think an appropriate  evaluation is warranted. Aortoiliac duplex and ABIs be done at her convenience. We will see her back following these studies.  Aortic atherosclerosis (HCC) I have independently looked at her plain x-rays and she does have pretty dense calcification of her aorta and iliac vessels. Her pain is not entirely clear in its etiology. Certainly musculoskeletal or neurologic issues are primarily but given the appearance of her aorta on imaging studies atherosclerotic disease has to be considered. Her symptoms are not typical for claudication, but at times symptoms can be atypical and I think an appropriate evaluation is warranted. Aortoiliac duplex and ABIs be done at her convenience. We will see her back following these studies.      Festus BarrenJason Kayti Poss 08/21/2016, 3:51 PM   This note was created with Dragon medical transcription system.  Any errors from dictation are unintentional.

## 2016-08-21 NOTE — Assessment & Plan Note (Signed)
On anticoagulation 

## 2016-08-21 NOTE — Patient Instructions (Signed)
Peripheral Vascular Disease Peripheral vascular disease (PVD) is a disease of the blood vessels that are not part of your heart and brain. A simple term for PVD is poor circulation. In most cases, PVD narrows the blood vessels that carry blood from your heart to the rest of your body. This can result in a decreased supply of blood to your arms, legs, and internal organs, like your stomach or kidneys. However, it most often affects a person's lower legs and feet. There are two types of PVD.  Organic PVD. This is the more common type. It is caused by damage to the structure of blood vessels.  Functional PVD. This is caused by conditions that make blood vessels contract and tighten (spasm).  Without treatment, PVD tends to get worse over time. PVD can also lead to acute ischemic limb. This is when an arm or limb suddenly has trouble getting enough blood. This is a medical emergency. What are the causes? Each type of PVD has many different causes. The most common cause of PVD is buildup of a fatty material (plaque) inside of your arteries (atherosclerosis). Small amounts of plaque can break off from the walls of the blood vessels and become lodged in a smaller artery. This blocks blood flow and can cause acute ischemic limb. Other common causes of PVD include:  Blood clots that form inside of blood vessels.  Injuries to blood vessels.  Diseases that cause inflammation of blood vessels or cause blood vessel spasms.  Health behaviors and health history that increase your risk of developing PVD.  What increases the risk? You may have a greater risk of PVD if you:  Have a family history of PVD.  Have certain medical conditions, including: ? High cholesterol. ? Diabetes. ? High blood pressure (hypertension). ? Coronary heart disease. ? Past problems with blood clots. ? Past injury, such as burns or a broken bone. These may have damaged blood vessels in your limbs. ? Buerger disease. This is  caused by inflamed blood vessels in your hands and feet. ? Some forms of arthritis. ? Rare birth defects that affect the arteries in your legs.  Use tobacco.  Do not get enough exercise.  Are obese.  Are age 50 or older.  What are the signs or symptoms? PVD may cause many different symptoms. Your symptoms depend on what part of your body is not getting enough blood. Some common signs and symptoms include:  Cramps in your lower legs. This may be a symptom of poor leg circulation (claudication).  Pain and weakness in your legs while you are physically active that goes away when you rest (intermittent claudication).  Leg pain when at rest.  Leg numbness, tingling, or weakness.  Coldness in a leg or foot, especially when compared with the other leg.  Skin or hair changes. These can include: ? Hair loss. ? Shiny skin. ? Pale or bluish skin. ? Thick toenails.  Inability to get or maintain an erection (erectile dysfunction).  People with PVD are more prone to developing ulcers and sores on their toes, feet, or legs. These may take longer than normal to heal. How is this diagnosed? Your health care provider may diagnose PVD from your signs and symptoms. The health care provider will also do a physical exam. You may have tests to find out what is causing your PVD and determine its severity. Tests may include:  Blood pressure recordings from your arms and legs and measurements of the strength of your pulses (  pulse volume recordings).  Imaging studies using sound waves to take pictures of the blood flow through your blood vessels (Doppler ultrasound).  Injecting a dye into your blood vessels before having imaging studies using: ? X-rays (angiogram or arteriogram). ? Computer-generated X-rays (CT angiogram). ? A powerful electromagnetic field and a computer (magnetic resonance angiogram or MRA).  How is this treated? Treatment for PVD depends on the cause of your condition and the  severity of your symptoms. It also depends on your age. Underlying causes need to be treated and controlled. These include long-lasting (chronic) conditions, such as diabetes, high cholesterol, and high blood pressure. You may need to first try making lifestyle changes and taking medicines. Surgery may be needed if these do not work. Lifestyle changes may include:  Quitting smoking.  Exercising regularly.  Following a low-fat, low-cholesterol diet.  Medicines may include:  Blood thinners to prevent blood clots.  Medicines to improve blood flow.  Medicines to improve your blood cholesterol levels.  Surgical procedures may include:  A procedure that uses an inflated balloon to open a blocked artery and improve blood flow (angioplasty).  A procedure to put in a tube (stent) to keep a blocked artery open (stent implant).  Surgery to reroute blood flow around a blocked artery (peripheral bypass surgery).  Surgery to remove dead tissue from an infected wound on the affected limb.  Amputation. This is surgical removal of the affected limb. This may be necessary in cases of acute ischemic limb that are not improved through medical or surgical treatments.  Follow these instructions at home:  Take medicines only as directed by your health care provider.  Do not use any tobacco products, including cigarettes, chewing tobacco, or electronic cigarettes. If you need help quitting, ask your health care provider.  Lose weight if you are overweight, and maintain a healthy weight as directed by your health care provider.  Eat a diet that is low in fat and cholesterol. If you need help, ask your health care provider.  Exercise regularly. Ask your health care provider to suggest some good activities for you.  Use compression stockings or other mechanical devices as directed by your health care provider.  Take good care of your feet. ? Wear comfortable shoes that fit well. ? Check your feet  often for any cuts or sores. Contact a health care provider if:  You have cramps in your legs while walking.  You have leg pain when you are at rest.  You have coldness in a leg or foot.  Your skin changes.  You have erectile dysfunction.  You have cuts or sores on your feet that are not healing. Get help right away if:  Your arm or leg turns cold and blue.  Your arms or legs become red, warm, swollen, painful, or numb.  You have chest pain or trouble breathing.  You suddenly have weakness in your face, arm, or leg.  You become very confused or lose the ability to speak.  You suddenly have a very bad headache or lose your vision. This information is not intended to replace advice given to you by your health care provider. Make sure you discuss any questions you have with your health care provider. Document Released: 04/09/2004 Document Revised: 08/08/2015 Document Reviewed: 08/10/2013 Elsevier Interactive Patient Education  2017 [employer]  

## 2016-08-21 NOTE — Assessment & Plan Note (Signed)
I have independently looked at her plain x-rays and she does have pretty dense calcification of her aorta and iliac vessels. Her pain is not entirely clear in its etiology. Certainly musculoskeletal or neurologic issues are primarily but given the appearance of her aorta on imaging studies atherosclerotic disease has to be considered. Her symptoms are not typical for claudication, but at times symptoms can be atypical and I think an appropriate evaluation is warranted. Aortoiliac duplex and ABIs be done at her convenience. We will see her back following these studies.

## 2016-08-21 NOTE — Assessment & Plan Note (Signed)
Her pain is not entirely clear in its etiology. Certainly musculoskeletal or neurologic issues are primarily but given the appearance of her aorta on imaging studies atherosclerotic disease has to be considered. Her symptoms are not typical for claudication, but at times symptoms can be atypical and I think an appropriate evaluation is warranted. Aortoiliac duplex and ABIs be done at her convenience. We will see her back following these studies.

## 2016-10-20 ENCOUNTER — Encounter (INDEPENDENT_AMBULATORY_CARE_PROVIDER_SITE_OTHER): Payer: Self-pay | Admitting: Vascular Surgery

## 2016-10-20 ENCOUNTER — Ambulatory Visit (INDEPENDENT_AMBULATORY_CARE_PROVIDER_SITE_OTHER): Payer: Medicare Other | Admitting: Vascular Surgery

## 2016-10-20 ENCOUNTER — Ambulatory Visit (INDEPENDENT_AMBULATORY_CARE_PROVIDER_SITE_OTHER): Payer: Medicare Other

## 2016-10-20 VITALS — BP 146/75 | HR 72 | Resp 14 | Ht 67.0 in | Wt 139.0 lb

## 2016-10-20 DIAGNOSIS — M79605 Pain in left leg: Secondary | ICD-10-CM

## 2016-10-20 DIAGNOSIS — I482 Chronic atrial fibrillation, unspecified: Secondary | ICD-10-CM

## 2016-10-20 DIAGNOSIS — I700 Atherosclerosis of aorta: Secondary | ICD-10-CM

## 2016-10-20 DIAGNOSIS — I10 Essential (primary) hypertension: Secondary | ICD-10-CM | POA: Diagnosis not present

## 2016-10-20 NOTE — Assessment & Plan Note (Signed)
Her aortoiliac duplex today shows calcific disease but no hemodynamically significant stenosis in the aorta or either iliac artery. Her ABIs today are 1.2 on the right and 1.16 on the left with brisk waveforms consistent with no significant arterial insufficiency. At this point, she will continue her current medical regimen. Plan recheck in about 2 years with ABIs. No intervention required.

## 2016-10-20 NOTE — Progress Notes (Signed)
MRN : [id number]  Tina Barron is a 83 y.o. (September 10, 1932) female who presents with chief complaint of  Chief Complaint  Patient presents with  . Follow-up    ABI/AORTA/Iliac u/s follow up  .  History of Present Illness: Patient returns today in follow up of aortic atherosclerosis. She remains in her usual state of health with no new complaints today. Her aortoiliac duplex today shows calcific disease but no hemodynamically significant stenosis in the aorta or either iliac artery. Her ABIs today are 1.2 on the right and 1.16 on the left with brisk waveforms consistent with no significant arterial insufficiency.  Current Outpatient Prescriptions  Medication Sig Dispense Refill  . acetaminophen (TYLENOL) 500 MG tablet Take 500 mg by mouth every 6 (six) hours as needed.    . chlorhexidine (PERIDEX) 0.12 % solution     . losartan (COZAAR) 100 MG tablet Take by mouth.    . metoprolol succinate (TOPROL-XL) 25 MG 24 hr tablet     . MULTAQ 400 MG tablet     . warfarin (COUMADIN) 2.5 MG tablet Take by mouth.    . warfarin (COUMADIN) 3 MG tablet TAKE 1 TABLET (3 MG TOTAL) BY MOUTH ONCE DAILY.     No current facility-administered medications for this visit.     Past Medical History:  Diagnosis Date  . Anxiety and depression   . Atherosclerosis   . B12 deficiency   . Degenerative joint disease (DJD) of hip   . Fibromyalgia syndrome   . HH (hiatus hernia)   . Hypertension   . IBS (irritable bowel syndrome)   . Osteopenia   . Paroxysmal A-fib (HCC)   . Sinoatrial node dysfunction (HCC)    S/p pacemaker implant 04/17/08. Atrial lead revision 06/08/08    . Valvular heart disease    S/p tricuspid valve replacement with a 29mm bioprosthesis and mitral valve repair with St Jude SARP26 and epicardial modified MAZE procedure on 10/28/06      Past Surgical History:  Procedure Laterality Date  . APPENDECTOMY  1952  . CARDIAC VALVE REPLACEMENT    . LUMBAR LAMINECTOMY  1989  . PARTIAL  HYSTERECTOMY  1972  . PLACEMENT OF BREAST IMPLANTS  1976  . REPLACEMENT TOTAL HIP W/  RESURFACING IMPLANTS  2015    Social History Social History  Substance Use Topics  . Smoking status: Never Smoker  . Smokeless tobacco: Never Used  . Alcohol use Not on file  No IVDU  Family History NO bleeding or clotting disorders  Allergies  Allergen Reactions  . Amlodipine Itching  . Diltiazem Hcl Hives  . Duloxetine Other (See Comments)    Hyperactivity   . Other Other (See Comments) and Hives    Uncoded Allergy. Allergen: Shellfish Uncoded Allergy. Allergen: Msg  . Penicillin G Itching  . Shellfish Allergy   . Doxycycline     Blurred vision  . Hydrocortisone Rash  . Prednisone Rash      REVIEW OF SYSTEMS (Negative unless checked)  Constitutional: [] Weight loss  [] Fever  [] Chills Cardiac: [] Chest pain   [] Chest pressure   [x] Palpitations   [] Shortness of breath when laying flat   [] Shortness of breath at rest   [] Shortness of breath with exertion. Vascular:  [x] Pain in legs with walking   [x] Pain in legs at rest   [] Pain in legs when laying flat   [] Claudication   [] Pain in feet when walking  [] Pain in feet at rest  [] Pain in feet when laying  flat   [] History of DVT   [] Phlebitis   [] Swelling in legs   [] Varicose veins   [] Non-healing ulcers Pulmonary:   [] Uses home oxygen   [] Productive cough   [] Hemoptysis   [] Wheeze  [] COPD   [] Asthma Neurologic:  [] Dizziness  [] Blackouts   [] Seizures   [] History of stroke   [] History of TIA  [] Aphasia   [] Temporary blindness   [] Dysphagia   [] Weakness or numbness in arms   [] Weakness or numbness in legs Musculoskeletal:  [x] Arthritis   [] Joint swelling   [] Joint pain   [] Low back pain Hematologic:  [] Easy bruising  [] Easy bleeding   [] Hypercoagulable state   [] Anemic  [] Hepatitis Gastrointestinal:  [] Blood in stool   [] Vomiting blood  [x] Gastroesophageal reflux/heartburn   [] Abdominal pain Genitourinary:  [] Chronic kidney disease    [] Difficult urination  [] Frequent urination  [] Burning with urination   [] Hematuria Skin:  [] Rashes   [] Ulcers   [] Wounds Psychological:  [x] History of anxiety   [x]  History of major depression.   Physical Examination  BP (!) 146/75 (BP Location: Right Arm)   Pulse 72   Resp 14   Ht 5\' 7"  (1.702 m)   Wt 139 lb (63 kg)   BMI 21.77 kg/m  Gen:  WD/WN, NAD Head: [place]/AT, No temporalis wasting. Ear/Nose/Throat: Hearing grossly intact, nares w/o erythema or drainage, trachea midline Eyes: Conjunctiva clear. Sclera non-icteric Neck: Supple.  No JVD.  Pulmonary:  Good air movement, no use of accessory muscles.  Cardiac: irregular Vascular:  Vessel Right Left  Radial Palpable Palpable  Ulnar Palpable Palpable  Brachial Palpable Palpable  Carotid Palpable, without bruit Palpable, without bruit  Aorta Not palpable N/A  Femoral Palpable Palpable  Popliteal 1+ Palpable 1+ Palpable  PT 1+ Palpable Palpable  DP Palpable 1+ Palpable   Gastrointestinal: soft, non-tender/non-distended.  Musculoskeletal: M/S 5/5 throughout.  No deformity or atrophy.  Neurologic: Sensation grossly intact in extremities.  Symmetrical.  Speech is fluent.  Psychiatric: Judgment intact, Mood & affect appropriate for pt's clinical situation. Dermatologic: No rashes or ulcers noted.  No cellulitis or open wounds.       Labs No results found for this or any previous visit (from the past 2160 hour(s)).  Radiology No results found.   Assessment/Plan Essential hypertension, benign blood pressure control important in reducing the progression of atherosclerotic disease. On appropriate oral medications.   Atrial fibrillation (HCC) On anticoagulation  Leg pain Not from arterial insufficiency given today's studies.  Aortic atherosclerosis (HCC)  Her aortoiliac duplex today shows calcific disease but no hemodynamically significant stenosis in the aorta or either iliac artery. Her ABIs today are 1.2 on  the right and 1.16 on the left with brisk waveforms consistent with no significant arterial insufficiency. At this point, she will continue her current medical regimen. Plan recheck in about 2 years with ABIs. No intervention required.    Festus BarrenJason Cayli Escajeda, MD  10/20/2016 12:32 PM    This note was created with Dragon medical transcription system.  Any errors from dictation are purely unintentional

## 2016-10-20 NOTE — Assessment & Plan Note (Signed)
Not from arterial insufficiency given today's studies.

## 2016-11-20 ENCOUNTER — Other Ambulatory Visit: Payer: Self-pay | Admitting: Physical Medicine & Rehabilitation

## 2016-11-20 DIAGNOSIS — M25552 Pain in left hip: Secondary | ICD-10-CM

## 2016-12-01 ENCOUNTER — Ambulatory Visit
Admission: RE | Admit: 2016-12-01 | Discharge: 2016-12-01 | Disposition: A | Discharge status: Home / Self Care | Payer: Medicare Other | Source: Ambulatory Visit | Attending: Physical Medicine & Rehabilitation | Admitting: Physical Medicine & Rehabilitation

## 2016-12-01 DIAGNOSIS — K5730 Diverticulosis of large intestine without perforation or abscess without bleeding: Secondary | ICD-10-CM | POA: Diagnosis not present

## 2016-12-01 DIAGNOSIS — M1612 Unilateral primary osteoarthritis, left hip: Secondary | ICD-10-CM | POA: Diagnosis not present

## 2016-12-01 DIAGNOSIS — Z8781 Personal history of (healed) traumatic fracture: Secondary | ICD-10-CM | POA: Diagnosis not present

## 2016-12-01 DIAGNOSIS — M25552 Pain in left hip: Secondary | ICD-10-CM | POA: Diagnosis not present

## 2016-12-01 DIAGNOSIS — M24652 Ankylosis, left hip: Secondary | ICD-10-CM | POA: Diagnosis not present

## 2017-03-31 NOTE — Progress Notes (Signed)
Hematology/Oncology Follow up note [facility] Telephone:(336) [phone] Fax:(336) [phone]   Patient Care Team: Patrice Paradise, MD as PCP - General (Physician Assistant)  CHIEF COMPLAINTS/PURPOSE OF CONSULTATION:  Evaluation of fatigue and iron deficiency anemia  HISTORY OF PRESENTING ILLNESS:  Tina Barron is a  84 y.o.  female with PMH listed below who was referred to me for evaluation of iron deficiency anemia and fatigue. Patient reports that she was seen by a hematologist remotely( many years ago) for iron deficiency anemia. And she has got IV iron infusion 2. She couldn't remember the products she got. She recently had lab work down with her primary care physician as workup of fatigue. Extensive medical history review performed by me. 03/26/2017, ferritin 9, TIBC 514.2, transferrin 367.3, percentage saturation 6, CBC showed hemoglobin 8.1, hematocrit 26.8, MCV 92.7, WBC 4.1. TSH 3.49. Chemistry revealed normal bilirubin 0.6, creatinine 1, BUN 21. Patient reports profoundly weak. She lives by herself. She doesn't have much of appetite. Her husband passed away at 8 years ago, and she doesn't have interest o'clock to much for herself. She had a colonoscopy done at the [facility]. She denies seeing any blood in the stool. She has a history of bioprosthetic valve replacement, mitral valve repair, Atrial fibrillation and has been on chronic anticoagulation with Coumadin.   Review of Systems  Constitutional: Positive for malaise/fatigue. Negative for fever and weight loss.  HENT: Negative for ear pain and hearing loss.   Eyes: Negative for blurred vision.  Respiratory: Negative for cough.   Cardiovascular: Negative for chest pain.  Gastrointestinal: Negative for abdominal pain, blood in stool, constipation, heartburn and vomiting.  Genitourinary: Negative for dysuria.  Musculoskeletal: Negative for myalgias.  Skin: Negative for rash.  Neurological:  Positive for weakness. Negative for dizziness.  Endo/Heme/Allergies: Does not bruise/bleed easily.  Psychiatric/Behavioral: Negative for depression.    MEDICAL HISTORY:  Past Medical History:  Diagnosis Date  . Anxiety and depression   . Atherosclerosis   . B12 deficiency   . Degenerative joint disease (DJD) of hip   . Fibromyalgia syndrome   . HH (hiatus hernia)   . Hypertension   . IBS (irritable bowel syndrome)   . Osteopenia   . Paroxysmal A-fib (HCC)   . Sinoatrial node dysfunction (HCC)    S/p pacemaker implant 04/17/08. Atrial lead revision 06/08/08    . 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 APPENDECTOMY  1952  . CARDIAC VALVE REPLACEMENT    . LUMBAR LAMINECTOMY  1989  . PARTIAL HYSTERECTOMY  1972  . PLACEMENT OF BREAST IMPLANTS  1976  . REPLACEMENT TOTAL HIP W/  RESURFACING IMPLANTS  2015    SOCIAL HISTORY: Social History   Socioeconomic History  . Marital status: Married    Spouse name: Not on file  . Number of children: Not on file  . Years of education: Not on file  . Highest education level: Not on file  Social Needs  . Financial resource strain: Not on file  . Food insecurity - worry: Not on file  . Food insecurity - inability: Not on file  . Transportation needs - medical: Not on file  . Transportation needs - non-medical: Not on file  Occupational History  . Not on file  Tobacco  Use  . Smoking status: Never Smoker  . Smokeless tobacco: Never Used  Substance and Sexual Activity  . Alcohol use: Not on file  . Drug use: Not on file  . Sexual activity: Not on file  Other Topics Concern  . Not on file  Social History Narrative  . Not on file    FAMILY HISTORY: No family history on file.  ALLERGIES:  is allergic to amlodipine;  diltiazem hcl; duloxetine; other; penicillin g; shellfish allergy; doxycycline; hydrocortisone; and prednisone.  MEDICATIONS:  Current Outpatient Medications  Medication Sig Dispense Refill  . acetaminophen (TYLENOL) 500 MG tablet Take 500 mg by mouth every 6 (six) hours as needed.    . chlorhexidine (PERIDEX) 0.12 % solution     . losartan (COZAAR) 100 MG tablet Take by mouth.    . metoprolol succinate (TOPROL-XL) 25 MG 24 hr tablet     . MULTAQ 400 MG tablet     . warfarin (COUMADIN) 2.5 MG tablet Take by mouth.    . warfarin (COUMADIN) 3 MG tablet TAKE 1 TABLET (3 MG TOTAL) BY MOUTH ONCE DAILY.     No current facility-administered medications for this visit.      PHYSICAL EXAMINATION: ECOG PERFORMANCE STATUS: 1 - Symptomatic but completely ambulatory Vitals:   04/01/17 1441  BP: 133/69  Pulse: 91  Resp: 16  Temp: 97.9 F (36.6 C)   There were no vitals filed for this visit.  Physical Exam  Constitutional: She is oriented to person, place, and time and well-developed, well-nourished, and in no distress. No distress.  Pallor  HENT:  Head: Normocephalic and atraumatic.  Mouth/Throat: No oropharyngeal exudate.  Eyes: EOM are normal. Pupils are equal, round, and reactive to light.  Cardiovascular: Normal rate. Exam reveals no friction rub.  No murmur heard. Pulmonary/Chest: Effort normal and breath sounds normal. No respiratory distress.  Abdominal: Bowel sounds are normal. There is tenderness.  Musculoskeletal: Normal range of motion. She exhibits no edema.  Neurological: She is alert and oriented to person, place, and time. No cranial nerve deficit.  Skin: Skin is warm and dry.  Psychiatric: Affect normal.     LABORATORY DATA:  I have reviewed the data as listed [facility] 03/26/2017, ferritin 9, TIBC 514.2, transferrin 367.3, percentage saturation 6, CBC showed hemoglobin 8.1, hematocrit 26.8, MCV 92.7, WBC 4.1. TSH 3.49. Chemistry revealed normal bilirubin  0.6, creatinine 1, BUN 21.  ASSESSMENT & PLAN:  1. Other iron deficiency anemia    Discussed with patient that her anemia is likely secondary to iron deficiency. Although that she had a colonoscopy 3 years ago, I recommend her to follow-up with her GI physician and had a repeat colonoscopy. Patient declined as she does not want any additional colonoscopy done. She is interested about IV iron. Plan IV iron with Feraheme weekly x2.  Allergy reactions/infusion reaction including anaphylactic reaction discussed with patient. Patient voices understanding and willing to proceed. Will need to obtain her old records and see what iron treatment she was placed on.   All questions were answered. The patient knows to call the clinic with any problems questions or concerns.  Return of visit: 4 weeks with repeat CBC, iron, TIBC ferritin. Total face to face encounter time for this patient visit was 30 min. >50% of the time was  spent in counseling and coordination of care.   Rickard PatienceZhou Aariv Medlock, MD, PhD Hematology Oncology [facility] Pager- [phone] 03/31/2017

## 2017-04-01 ENCOUNTER — Encounter: Payer: Self-pay | Admitting: Oncology

## 2017-04-01 ENCOUNTER — Inpatient Hospital Stay: Payer: Medicare Other | Attending: Oncology | Admitting: Oncology

## 2017-04-01 VITALS — BP 133/69 | HR 91 | Temp 97.9°F | Resp 16 | Wt 136.2 lb

## 2017-04-01 DIAGNOSIS — R630 Anorexia: Secondary | ICD-10-CM | POA: Insufficient documentation

## 2017-04-01 DIAGNOSIS — Z7901 Long term (current) use of anticoagulants: Secondary | ICD-10-CM | POA: Diagnosis not present

## 2017-04-01 DIAGNOSIS — D508 Other iron deficiency anemias: Secondary | ICD-10-CM | POA: Diagnosis present

## 2017-04-01 DIAGNOSIS — I4891 Unspecified atrial fibrillation: Secondary | ICD-10-CM | POA: Insufficient documentation

## 2017-04-01 DIAGNOSIS — R531 Weakness: Secondary | ICD-10-CM | POA: Diagnosis not present

## 2017-04-01 DIAGNOSIS — Z952 Presence of prosthetic heart valve: Secondary | ICD-10-CM | POA: Insufficient documentation

## 2017-04-01 DIAGNOSIS — D509 Iron deficiency anemia, unspecified: Secondary | ICD-10-CM | POA: Insufficient documentation

## 2017-04-01 NOTE — Addendum Note (Signed)
Addended by: Rickard PatienceYU, Mate Alegria on: 04/01/2017 04:12 PM   Modules accepted: Level of Service

## 2017-04-08 ENCOUNTER — Inpatient Hospital Stay: Payer: Medicare Other

## 2017-04-08 VITALS — BP 116/70 | HR 88 | Resp 18

## 2017-04-08 DIAGNOSIS — D508 Other iron deficiency anemias: Secondary | ICD-10-CM

## 2017-04-08 DIAGNOSIS — D509 Iron deficiency anemia, unspecified: Secondary | ICD-10-CM

## 2017-04-08 MED ORDER — SODIUM CHLORIDE 0.9 % IV SOLN
510.0000 mg | Freq: Once | INTRAVENOUS | Status: AC
  Administered 2017-04-08: 510 mg via INTRAVENOUS
  Filled 2017-04-08: qty 17

## 2017-04-08 MED ORDER — SODIUM CHLORIDE 0.9 % IV SOLN
Freq: Once | INTRAVENOUS | Status: AC
  Administered 2017-04-08: 14:00:00 via INTRAVENOUS
  Filled 2017-04-08: qty 1000

## 2017-04-15 ENCOUNTER — Inpatient Hospital Stay: Payer: Medicare Other

## 2017-04-15 VITALS — BP 127/73 | HR 74 | Resp 20

## 2017-04-15 DIAGNOSIS — D508 Other iron deficiency anemias: Secondary | ICD-10-CM

## 2017-04-15 DIAGNOSIS — D509 Iron deficiency anemia, unspecified: Secondary | ICD-10-CM

## 2017-04-15 MED ORDER — SODIUM CHLORIDE 0.9 % IV SOLN
510.0000 mg | Freq: Once | INTRAVENOUS | Status: AC
  Administered 2017-04-15: 510 mg via INTRAVENOUS
  Filled 2017-04-15: qty 17

## 2017-04-15 MED ORDER — SODIUM CHLORIDE 0.9 % IV SOLN
Freq: Once | INTRAVENOUS | Status: AC
  Administered 2017-04-15: 15:00:00 via INTRAVENOUS
  Filled 2017-04-15: qty 1000

## 2017-04-26 ENCOUNTER — Inpatient Hospital Stay: Payer: Medicare Other | Attending: Oncology

## 2017-04-26 DIAGNOSIS — D508 Other iron deficiency anemias: Secondary | ICD-10-CM

## 2017-04-26 DIAGNOSIS — Z7901 Long term (current) use of anticoagulants: Secondary | ICD-10-CM | POA: Insufficient documentation

## 2017-04-26 DIAGNOSIS — D509 Iron deficiency anemia, unspecified: Secondary | ICD-10-CM | POA: Insufficient documentation

## 2017-04-26 DIAGNOSIS — I482 Chronic atrial fibrillation: Secondary | ICD-10-CM | POA: Diagnosis not present

## 2017-04-26 LAB — CBC WITH DIFFERENTIAL/PLATELET
BASOS ABS: 0.1 10*3/uL (ref 0–0.1)
BASOS PCT: 1 %
EOS ABS: 0.1 10*3/uL (ref 0–0.7)
Eosinophils Relative: 2 %
HEMATOCRIT: 32.6 % — AB (ref 35.0–47.0)
Hemoglobin: 10.4 g/dL — ABNORMAL LOW (ref 12.0–16.0)
Lymphocytes Relative: 19 %
Lymphs Abs: 0.9 10*3/uL — ABNORMAL LOW (ref 1.0–3.6)
MCH: 30.1 pg (ref 26.0–34.0)
MCHC: 32.1 g/dL (ref 32.0–36.0)
MCV: 93.9 fL (ref 80.0–100.0)
MONO ABS: 0.4 10*3/uL (ref 0.2–0.9)
MONOS PCT: 9 %
NEUTROS ABS: 3.2 10*3/uL (ref 1.4–6.5)
NEUTROS PCT: 69 %
Platelets: 202 10*3/uL (ref 150–440)
RBC: 3.47 MIL/uL — ABNORMAL LOW (ref 3.80–5.20)
RDW: 25.3 % — AB (ref 11.5–14.5)
WBC: 4.6 10*3/uL (ref 3.6–11.0)

## 2017-04-26 LAB — IRON AND TIBC
Iron: 115 ug/dL (ref 28–170)
SATURATION RATIOS: 31 % (ref 10.4–31.8)
TIBC: 373 ug/dL (ref 250–450)
UIBC: 258 ug/dL

## 2017-04-26 LAB — FERRITIN: FERRITIN: 346 ng/mL — AB (ref 11–307)

## 2017-04-26 NOTE — Progress Notes (Signed)
Hematology/Oncology Follow up note [facility] Telephone:(336) [phone] Fax:(336) [phone]   Patient Care Team: Patrice Paradise, MD as PCP - General (Physician Assistant)  CHIEF COMPLAINTS/PURPOSE OF CONSULTATION:  Evaluation of fatigue and iron deficiency anemia  HISTORY OF PRESENTING ILLNESS:  Tina Barron is a  84 y.o.  female with PMH listed below who was referred to me for evaluation of iron deficiency anemia and fatigue. Patient reports that she was seen by a hematologist remotely( many years ago) for iron deficiency anemia. And she has got IV iron infusion 2. She couldn't remember the products she got. She recently had lab work down with her primary care physician as workup of fatigue. Extensive medical history review performed by me. 03/26/2017, ferritin 9, TIBC 514.2, transferrin 367.3, percentage saturation 6, CBC showed hemoglobin 8.1, hematocrit 26.8, MCV 92.7, WBC 4.1. TSH 3.49. Chemistry revealed normal bilirubin 0.6, creatinine 1, BUN 21. Patient reports profoundly weak. She lives by herself. She doesn't have much of appetite. Her husband passed away at 8 years ago, and she doesn't have interest o'clock to much for herself. She had a colonoscopy done at the [facility]. She denies seeing any blood in the stool. She has a history of bioprosthetic valve replacement, mitral valve repair, Atrial fibrillation and has been on chronic anticoagulation with Coumadin.   INTERVAL HISTORY Tina Barron is a 84 y.o. female who has above history reviewed by me today presents for follow up visit for management of iron deficiency anemia.  During the interval patient has had ferriheme 510 mg x2 IV.  Patient reports fatigue slightly better but not back to her baseline yet. She also mentioned that her younger sister just passed away last week and she is very emotional right now.  She feels that emotionally changes can also contribute to the fatigue that she is  experiencing.  Denies any chest pain, shortness of breath, abdominal pain.  She denies seeing any blood in the stool or urine.  Review of Systems  Constitutional: Positive for malaise/fatigue. Negative for fever and weight loss.  HENT: Negative for ear pain, hearing loss and nosebleeds.   Eyes: Negative for blurred vision.  Respiratory: Negative for cough.   Cardiovascular: Negative for chest pain.  Gastrointestinal: Negative for abdominal pain, blood in stool, constipation, heartburn, nausea and vomiting.  Genitourinary: Negative for dysuria.  Musculoskeletal: Negative for myalgias.  Skin: Negative for rash.  Neurological: Negative for dizziness and weakness.  Endo/Heme/Allergies: Does not bruise/bleed easily.  Psychiatric/Behavioral: Negative for depression. The patient is not nervous/anxious.     MEDICAL HISTORY:  Past Medical History:  Diagnosis Date  . Anxiety and depression   . Atherosclerosis   . B12 deficiency   . Degenerative joint disease (DJD) of hip   . Fibromyalgia syndrome   . HH (hiatus hernia)   . Hypertension   . IBS (irritable bowel syndrome)   . Osteopenia   . Paroxysmal A-fib (HCC)   . Sinoatrial node dysfunction (HCC)    S/p pacemaker implant 04/17/08. Atrial lead revision 06/08/08    . 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 APPENDECTOMY  1952  . CARDIAC VALVE REPLACEMENT    . LUMBAR LAMINECTOMY  1989  . PARTIAL HYSTERECTOMY  1972  . PLACEMENT OF BREAST IMPLANTS  1976  . REPLACEMENT TOTAL HIP W/  RESURFACING IMPLANTS  2015    SOCIAL HISTORY: Social History   Socioeconomic History  . Marital status: Married    Spouse name: Not on file  . Number of children: Not on file  . Years of education: Not on file  . Highest education level: Not  on file  Social Needs  . Financial resource strain: Not on file  . Food insecurity - worry: Not on file  . Food insecurity - inability: Not on file  . Transportation needs - medical: Not on file  . Transportation needs - non-medical: Not on file  Occupational History  . Not on file  Tobacco Use  . Smoking status: Never Smoker  . Smokeless tobacco: Never Used  Substance and Sexual Activity  . Alcohol use: Not on file  . Drug use: Not on file  . Sexual activity: Not on file  Other Topics Concern  . Not on file  Social History Narrative  . Not on file    FAMILY HISTORY: No family history on file.  ALLERGIES:  is allergic to amlodipine; diltiazem hcl; duloxetine; other; penicillin g; shellfish allergy; doxycycline; hydrocortisone; and prednisone.  MEDICATIONS:  Current Outpatient Medications  Medication Sig Dispense Refill  . acetaminophen (TYLENOL) 500 MG tablet Take 500 mg by mouth every 6 (six) hours as needed.    Marland Kitchen losartan (COZAAR) 100 MG tablet Take by mouth.    . metoprolol succinate (TOPROL-XL) 25 MG 24 hr tablet     . MULTAQ 400 MG tablet     . warfarin (COUMADIN) 2.5 MG tablet Take by mouth.     No current facility-administered medications for this visit.      PHYSICAL EXAMINATION: ECOG PERFORMANCE STATUS: 1 - Symptomatic but completely ambulatory Vitals:   04/27/17 1055  BP: (!) 147/74  Pulse: 66  Temp: (!) 97.2 F (36.2 C)   Filed Weights   04/27/17 1055  Weight: 136 lb 9 oz (61.9 kg)    Physical Exam  Constitutional: She is oriented to person, place, and time and well-developed, well-nourished, and in no distress. No distress.  Pallor  HENT:  Head: Normocephalic and atraumatic.  Mouth/Throat: No oropharyngeal exudate.  Eyes: EOM are normal. Pupils are equal, round, and reactive to light. No scleral icterus.  Neck: Normal range of motion. Neck supple.  Cardiovascular: Normal rate. Exam reveals no friction rub.  No murmur heard. Pulmonary/Chest:  Breath sounds normal. No respiratory distress.  Abdominal: Bowel sounds are normal. There is no tenderness.  Musculoskeletal: Normal range of motion. She exhibits no edema.  Lymphadenopathy:    She has no cervical adenopathy.  Neurological: She is alert and oriented to person, place, and time. No cranial nerve deficit.  Skin: Skin is warm and dry. No erythema.  Psychiatric: Affect and judgment normal.     LABORATORY DATA:  I have reviewed the data as listed [facility] 03/26/2017, ferritin 9, TIBC 514.2, transferrin 367.3, percentage saturation 6, CBC showed hemoglobin 8.1, hematocrit 26.8, MCV 92.7, WBC 4.1. TSH 3.49. Chemistry revealed normal bilirubin 0.6, creatinine 1, BUN 21. 03/18/2017 UA negative for hematuria.  ASSESSMENT & PLAN:  1. Iron deficiency anemia, unspecified iron deficiency anemia type   2. Chronic anticoagulation   3. Chronic atrial fibrillation (HCC)    Repeat ferritin level showed improvement 346.  Hemoglobin improved to 10.4. Her stool occult test have been negative as well as urine test negative for hematuria.  Tests were done at [facility].  #  Discussed with patient that I anticipate her anemia will continue to improve since now the iron store has been replete. Although that she has had colonoscopy about 3 years ago, I recommend patient to follow-up with her GI doctor and have a repeat colonoscopy.  Patient prefers to hold on making appointment to see GI and wait another 3 weeks and we will discuss above GI follow-up again.  All questions were answered. The patient knows to call the clinic with any problems questions or concerns.  Return of visit: 3 weeks with repeat CBC, iron, TIBC ferritin.  Rickard PatienceZhou Jacier Gladu, MD, PhD Hematology Oncology [facility] Pager- [phone] 04/27/2017

## 2017-04-27 ENCOUNTER — Other Ambulatory Visit: Payer: Medicare Other

## 2017-04-27 ENCOUNTER — Other Ambulatory Visit: Payer: Self-pay

## 2017-04-27 ENCOUNTER — Encounter: Payer: Self-pay | Admitting: Oncology

## 2017-04-27 ENCOUNTER — Inpatient Hospital Stay (HOSPITAL_BASED_OUTPATIENT_CLINIC_OR_DEPARTMENT_OTHER): Payer: Medicare Other | Admitting: Oncology

## 2017-04-27 VITALS — BP 147/74 | HR 66 | Temp 97.2°F | Wt 136.6 lb

## 2017-04-27 DIAGNOSIS — I482 Chronic atrial fibrillation, unspecified: Secondary | ICD-10-CM

## 2017-04-27 DIAGNOSIS — Z7901 Long term (current) use of anticoagulants: Secondary | ICD-10-CM

## 2017-04-27 DIAGNOSIS — D509 Iron deficiency anemia, unspecified: Secondary | ICD-10-CM

## 2017-04-27 NOTE — Progress Notes (Signed)
Patient here today for follow up.  Patient states no new concerns today  

## 2017-05-10 ENCOUNTER — Other Ambulatory Visit: Payer: Self-pay | Admitting: *Deleted

## 2017-05-10 DIAGNOSIS — I480 Paroxysmal atrial fibrillation: Secondary | ICD-10-CM

## 2017-05-14 ENCOUNTER — Inpatient Hospital Stay: Payer: Medicare Other | Attending: Oncology

## 2017-05-14 DIAGNOSIS — D508 Other iron deficiency anemias: Secondary | ICD-10-CM | POA: Insufficient documentation

## 2017-05-14 DIAGNOSIS — I482 Chronic atrial fibrillation: Secondary | ICD-10-CM | POA: Insufficient documentation

## 2017-05-14 DIAGNOSIS — Z7901 Long term (current) use of anticoagulants: Secondary | ICD-10-CM | POA: Insufficient documentation

## 2017-05-14 DIAGNOSIS — D509 Iron deficiency anemia, unspecified: Secondary | ICD-10-CM | POA: Insufficient documentation

## 2017-05-14 DIAGNOSIS — I480 Paroxysmal atrial fibrillation: Secondary | ICD-10-CM

## 2017-05-14 LAB — CBC WITH DIFFERENTIAL/PLATELET
BASOS ABS: 0.1 10*3/uL (ref 0–0.1)
BASOS PCT: 2 %
EOS PCT: 2 %
Eosinophils Absolute: 0.1 10*3/uL (ref 0–0.7)
HCT: 32.2 % — ABNORMAL LOW (ref 35.0–47.0)
Hemoglobin: 10.8 g/dL — ABNORMAL LOW (ref 12.0–16.0)
Lymphocytes Relative: 21 %
Lymphs Abs: 0.8 10*3/uL — ABNORMAL LOW (ref 1.0–3.6)
MCH: 32.1 pg (ref 26.0–34.0)
MCHC: 33.7 g/dL (ref 32.0–36.0)
MCV: 95.4 fL (ref 80.0–100.0)
MONO ABS: 0.3 10*3/uL (ref 0.2–0.9)
MONOS PCT: 8 %
Neutro Abs: 2.5 10*3/uL (ref 1.4–6.5)
Neutrophils Relative %: 67 %
PLATELETS: 181 10*3/uL (ref 150–440)
RBC: 3.38 MIL/uL — ABNORMAL LOW (ref 3.80–5.20)
RDW: 23.9 % — AB (ref 11.5–14.5)
WBC: 3.8 10*3/uL (ref 3.6–11.0)

## 2017-05-14 LAB — PROTIME-INR
INR: 2.63
Prothrombin Time: 27.9 seconds — ABNORMAL HIGH (ref 11.4–15.2)

## 2017-05-14 LAB — IRON AND TIBC
Iron: 72 ug/dL (ref 28–170)
SATURATION RATIOS: 23 % (ref 10.4–31.8)
TIBC: 317 ug/dL (ref 250–450)
UIBC: 245 ug/dL

## 2017-05-14 LAB — FERRITIN: FERRITIN: 175 ng/mL (ref 11–307)

## 2017-05-17 ENCOUNTER — Inpatient Hospital Stay: Payer: Medicare Other

## 2017-05-17 ENCOUNTER — Inpatient Hospital Stay (HOSPITAL_BASED_OUTPATIENT_CLINIC_OR_DEPARTMENT_OTHER): Payer: Medicare Other | Admitting: Oncology

## 2017-05-17 ENCOUNTER — Encounter: Payer: Self-pay | Admitting: Oncology

## 2017-05-17 ENCOUNTER — Other Ambulatory Visit: Payer: Self-pay

## 2017-05-17 VITALS — BP 154/73 | HR 68 | Temp 97.2°F | Wt 137.0 lb

## 2017-05-17 DIAGNOSIS — D509 Iron deficiency anemia, unspecified: Secondary | ICD-10-CM

## 2017-05-17 DIAGNOSIS — D508 Other iron deficiency anemias: Secondary | ICD-10-CM | POA: Diagnosis not present

## 2017-05-17 DIAGNOSIS — I482 Chronic atrial fibrillation, unspecified: Secondary | ICD-10-CM

## 2017-05-17 DIAGNOSIS — Z7901 Long term (current) use of anticoagulants: Secondary | ICD-10-CM | POA: Diagnosis not present

## 2017-05-17 NOTE — Progress Notes (Signed)
Hematology/Oncology Follow up note [facility] Telephone:([phone] [phone]   Patient Care Team: Patrice ParadiseMcLaughlin, Miriam K, MD as PCP - General (Physician Assistant)  CHIEF COMPLAINTS/PURPOSE OF CONSULTATION:  Evaluation of fatigue and iron deficiency anemia  HISTORY OF PRESENTING ILLNESS:  Tina Barron is a  84 y.o.  female with PMH listed below who was referred to me for evaluation of iron deficiency anemia and fatigue. Patient reports that she was seen by a hematologist remotely( many years ago) for iron deficiency anemia. And she has got IV iron infusion 2. She couldn't remember the products she got. She recently had lab work down with her primary care physician as workup of fatigue. Extensive medical history review performed by me. 03/26/2017, ferritin 9, TIBC 514.2, transferrin 367.3, percentage saturation 6, CBC showed hemoglobin 8.1, hematocrit 26.8, MCV 92.7, WBC 4.1. TSH 3.49. Chemistry revealed normal bilirubin 0.6, creatinine 1, BUN 21. Patient reports profoundly weak. She lives by herself. She doesn't have much of appetite. Her husband passed away at 8 years ago, and she doesn't have interest o'clock to much for herself. She had a [facility] done at [facility]. She denies seeing any blood in the stool. She has a history of bioprosthetic valve replacement, mitral valve repair, Atrial fibrillation and has been on chronic anticoagulation with Coumadin.   # Her stool occult test have been negative as well as urine test negative for hematuria.  [facility]. # s/p IV Feraheme 510mg  x 2.   INTERVAL HISTORY Tina Barron is a 84 y.o. female who has above history reviewed by me today presents for follow up visit for management of iron deficiency anemia.  She has felt less fatigue and better energy level. Denies any hematuria or blood in stool.   Review of Systems  Constitutional: Positive for malaise/fatigue. Negative  for fever and weight loss.  HENT: Negative for ear pain, hearing loss, nosebleeds and tinnitus.   Eyes: Negative for blurred vision, photophobia and pain.  Respiratory: Negative for cough, hemoptysis, sputum production and shortness of breath.   Cardiovascular: Negative for chest pain, palpitations and orthopnea.  Gastrointestinal: Negative for abdominal pain, blood in stool, constipation, heartburn, nausea and vomiting.  Genitourinary: Positive for frequency. Negative for dysuria, hematuria and urgency.  Musculoskeletal: Negative for back pain, myalgias and neck pain.  Skin: Negative for rash.  Neurological: Negative for dizziness, tingling, sensory change, speech change, weakness and headaches.  Endo/Heme/Allergies: Does not bruise/bleed easily.  Psychiatric/Behavioral: Negative for depression and substance abuse. The patient is not nervous/anxious.     MEDICAL HISTORY:  Past Medical History:  Diagnosis Date  . Anxiety and depression   . Atherosclerosis   . B12 deficiency   . Degenerative joint disease (DJD) of hip   . Fibromyalgia syndrome   . HH (hiatus hernia)   . Hypertension   . IBS (irritable bowel syndrome)   . Osteopenia   . Paroxysmal A-fib (HCC)   . Sinoatrial node dysfunction (HCC)    S/p pacemaker implant 04/17/08. Atrial lead revision 06/08/08    . 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 APPENDECTOMY  1952  . CARDIAC VALVE REPLACEMENT    . LUMBAR LAMINECTOMY  1989  . PARTIAL HYSTERECTOMY  1972  . PLACEMENT  OF BREAST IMPLANTS  1976  . REPLACEMENT TOTAL HIP W/  RESURFACING IMPLANTS  2015    SOCIAL HISTORY: Social History   Socioeconomic History  . Marital status: Married    Spouse name: Not on file  . Number of children: Not on file  . Years of  education: Not on file  . Highest education level: Not on file  Social Needs  . Financial resource strain: Not on file  . Food insecurity - worry: Not on file  . Food insecurity - inability: Not on file  . Transportation needs - medical: Not on file  . Transportation needs - non-medical: Not on file  Occupational History  . Not on file  Tobacco Use  . Smoking status: Never Smoker  . Smokeless tobacco: Never Used  Substance and Sexual Activity  . Alcohol use: Not on file  . Drug use: Not on file  . Sexual activity: Not on file  Other Topics Concern  . Not on file  Social History Narrative  . Not on file    FAMILY HISTORY: History reviewed. No pertinent family history.  ALLERGIES:  is allergic to amlodipine; diltiazem hcl; duloxetine; other; penicillin g; shellfish allergy; doxycycline; hydrocortisone; and prednisone.  MEDICATIONS:  Current Outpatient Medications  Medication Sig Dispense Refill  . acetaminophen (TYLENOL) 500 MG tablet Take 500 mg by mouth every 6 (six) hours as needed.    Marland Kitchen losartan (COZAAR) 100 MG tablet Take by mouth.    . metoprolol succinate (TOPROL-XL) 25 MG 24 hr tablet     . MULTAQ 400 MG tablet     . warfarin (COUMADIN) 2.5 MG tablet Take by mouth.     No current facility-administered medications for this visit.      PHYSICAL EXAMINATION: ECOG PERFORMANCE STATUS: 1 - Symptomatic but completely ambulatory Vitals:   05/17/17 1407  BP: (!) 154/73  Pulse: 68  Temp: (!) 97.2 F (36.2 C)   Filed Weights   05/17/17 1407  Weight: 137 lb (62.1 kg)    Physical Exam  Constitutional: She is oriented to person, place, and time and well-developed, well-nourished, and in no distress. No distress.  Pallor  HENT:  Head: Normocephalic and atraumatic.  Mouth/Throat: Oropharynx is clear and moist. No oropharyngeal exudate.  Eyes: EOM are normal. Pupils are equal, round, and reactive to light. Left eye exhibits no discharge. No scleral icterus.  Neck:  Normal range of motion. Neck supple. No JVD present.  Cardiovascular: Normal rate. Exam reveals no friction rub.  No murmur heard. Irregular rhythm.   Pulmonary/Chest: Breath sounds normal. No respiratory distress. She has no wheezes.  Abdominal: Bowel sounds are normal. There is no tenderness. There is no rebound.  Musculoskeletal: Normal range of motion. She exhibits no edema.  Lymphadenopathy:    She has no cervical adenopathy.  Neurological: She is alert and oriented to person, place, and time. No cranial nerve deficit.  Skin: Skin is warm and dry. No rash noted. She is not diaphoretic. No erythema.  Psychiatric: Affect and judgment normal.     LABORATORY DATA:  I have reviewed the data as listed [facility] 03/26/2017, ferritin 9, TIBC 514.2, transferrin 367.3, percentage saturation 6, CBC showed hemoglobin 8.1, hematocrit 26.8, MCV 92.7, WBC 4.1. TSH 3.49. Chemistry revealed normal bilirubin 0.6, creatinine 1, BUN 21. 03/18/2017 UA negative for hematuria.  ASSESSMENT & PLAN:  1. Iron deficiency anemia, unspecified iron deficiency anemia type   2. Chronic anticoagulation   3. Chronic atrial fibrillation (HCC)  4. Other iron deficiency anemia    Hemoglobin stable. Iron store decreased but acceptable.   #Discussed with patient that although she has had colonoscopy 3 years ago and I recommend her to make an appointment with GI for further evaluation for her iron deficiency. Patient is not willing to make an appointment with GI at this point.  Will need to monitor patient's cbc count and iron store periodically   All questions were answered. The patient knows to call the clinic with any problems questions or concerns.  Return of visit: 3 months with repeat CBC, iron, TIBC ferritin.  Rickard Patience, MD, PhD Hematology Oncology [facility] Pager- [phone] 05/17/2017

## 2017-05-17 NOTE — Progress Notes (Signed)
Patient here today for follow up.  Patient states no new concerns today  

## 2017-05-19 LAB — CELIAC PANEL 10
ANTIGLIADIN ABS, IGA: 5 U (ref 0–19)
Endomysial Ab, IgA: NEGATIVE
Gliadin IgG: 3 units (ref 0–19)
IgA: 311 mg/dL (ref 64–422)
Tissue Transglutaminase Ab, IgA: <2 U/mL (ref 0–3)

## 2017-05-31 ENCOUNTER — Inpatient Hospital Stay: Admit: 2017-05-31 | Payer: Medicare Other | Admitting: Orthopedic Surgery

## 2017-05-31 SURGERY — ARTHROPLASTY, HIP, TOTAL, ANTERIOR APPROACH
Anesthesia: Choice | Laterality: Left

## 2017-08-17 ENCOUNTER — Inpatient Hospital Stay: Payer: Medicare Other | Attending: Oncology

## 2017-08-17 DIAGNOSIS — D509 Iron deficiency anemia, unspecified: Secondary | ICD-10-CM | POA: Diagnosis present

## 2017-08-17 DIAGNOSIS — R233 Spontaneous ecchymoses: Secondary | ICD-10-CM | POA: Diagnosis not present

## 2017-08-17 DIAGNOSIS — Z7901 Long term (current) use of anticoagulants: Secondary | ICD-10-CM | POA: Insufficient documentation

## 2017-08-17 DIAGNOSIS — R5383 Other fatigue: Secondary | ICD-10-CM | POA: Insufficient documentation

## 2017-08-17 DIAGNOSIS — D7589 Other specified diseases of blood and blood-forming organs: Secondary | ICD-10-CM | POA: Diagnosis not present

## 2017-08-17 LAB — CBC WITH DIFFERENTIAL/PLATELET
Basophils Absolute: 0.1 10*3/uL (ref 0–0.1)
Basophils Relative: 1 %
EOS ABS: 0.1 10*3/uL (ref 0–0.7)
Eosinophils Relative: 2 %
HCT: 31.1 % — ABNORMAL LOW (ref 35.0–47.0)
HEMOGLOBIN: 10.6 g/dL — AB (ref 12.0–16.0)
LYMPHS ABS: 0.9 10*3/uL — AB (ref 1.0–3.6)
Lymphocytes Relative: 23 %
MCH: 34 pg (ref 26.0–34.0)
MCHC: 33.9 g/dL (ref 32.0–36.0)
MCV: 100.2 fL — ABNORMAL HIGH (ref 80.0–100.0)
MONO ABS: 0.3 10*3/uL (ref 0.2–0.9)
MONOS PCT: 8 %
Neutro Abs: 2.6 10*3/uL (ref 1.4–6.5)
Neutrophils Relative %: 66 %
Platelets: 187 10*3/uL (ref 150–440)
RBC: 3.11 MIL/uL — ABNORMAL LOW (ref 3.80–5.20)
RDW: 14.2 % (ref 11.5–14.5)
WBC: 4 10*3/uL (ref 3.6–11.0)

## 2017-08-17 LAB — FERRITIN: Ferritin: 42 ng/mL (ref 11–307)

## 2017-08-17 LAB — IRON AND TIBC
IRON: 52 ug/dL (ref 28–170)
Saturation Ratios: 15 % (ref 10.4–31.8)
TIBC: 337 ug/dL (ref 250–450)
UIBC: 285 ug/dL

## 2017-08-18 ENCOUNTER — Inpatient Hospital Stay (HOSPITAL_BASED_OUTPATIENT_CLINIC_OR_DEPARTMENT_OTHER): Payer: Medicare Other | Admitting: Oncology

## 2017-08-18 ENCOUNTER — Encounter: Payer: Self-pay | Admitting: Oncology

## 2017-08-18 ENCOUNTER — Inpatient Hospital Stay: Payer: Medicare Other

## 2017-08-18 ENCOUNTER — Other Ambulatory Visit: Payer: Medicare Other

## 2017-08-18 VITALS — BP 129/69 | HR 62 | Temp 96.0°F | Resp 18

## 2017-08-18 VITALS — BP 141/73 | HR 76 | Temp 97.8°F | Resp 18 | Wt 136.4 lb

## 2017-08-18 DIAGNOSIS — D509 Iron deficiency anemia, unspecified: Secondary | ICD-10-CM | POA: Diagnosis not present

## 2017-08-18 DIAGNOSIS — R5383 Other fatigue: Secondary | ICD-10-CM | POA: Diagnosis not present

## 2017-08-18 DIAGNOSIS — R233 Spontaneous ecchymoses: Secondary | ICD-10-CM | POA: Diagnosis not present

## 2017-08-18 DIAGNOSIS — D7589 Other specified diseases of blood and blood-forming organs: Secondary | ICD-10-CM | POA: Diagnosis not present

## 2017-08-18 DIAGNOSIS — R238 Other skin changes: Secondary | ICD-10-CM

## 2017-08-18 DIAGNOSIS — D508 Other iron deficiency anemias: Secondary | ICD-10-CM

## 2017-08-18 DIAGNOSIS — Z7901 Long term (current) use of anticoagulants: Secondary | ICD-10-CM

## 2017-08-18 LAB — PROTIME-INR
INR: 2.56
Prothrombin Time: 27.3 seconds — ABNORMAL HIGH (ref 11.4–15.2)

## 2017-08-18 MED ORDER — SODIUM CHLORIDE 0.9 % IV SOLN
510.0000 mg | Freq: Once | INTRAVENOUS | Status: AC
  Administered 2017-08-18: 510 mg via INTRAVENOUS
  Filled 2017-08-18: qty 17

## 2017-08-18 MED ORDER — SODIUM CHLORIDE 0.9 % IV SOLN
Freq: Once | INTRAVENOUS | Status: AC
  Administered 2017-08-18: 15:00:00 via INTRAVENOUS
  Filled 2017-08-18: qty 1000

## 2017-08-18 NOTE — Progress Notes (Signed)
Hematology/Oncology Follow up note [facility] Telephone:(336) [phone] Fax:(336) [phone]   Patient Care Team: Patrice Paradise, MD as PCP - General (Physician Assistant)  CHIEF COMPLAINTS/PURPOSE OF CONSULTATION:  Evaluation of fatigue and iron deficiency anemia  HISTORY OF PRESENTING ILLNESS:  Tina Barron is a  84 y.o.  female with PMH listed below who was referred to me for evaluation of iron deficiency anemia and fatigue. Patient reports that she was seen by a hematologist remotely( many years ago) for iron deficiency anemia. And she has got IV iron infusion 2. She couldn't remember the products she got. She recently had lab work down with her primary care physician as workup of fatigue. Extensive medical history review performed by me. 03/26/2017, ferritin 9, TIBC 514.2, transferrin 367.3, percentage saturation 6, CBC showed hemoglobin 8.1, hematocrit 26.8, MCV 92.7, WBC 4.1. TSH 3.49. Chemistry revealed normal bilirubin 0.6, creatinine 1, BUN 21. Patient reports profoundly weak. She lives by herself. She doesn't have much of appetite. Her husband passed away at 8 years ago, and she doesn't have interest o'clock to much for herself. She had a colonoscopy done at the [facility]. She denies seeing any blood in the stool. She has a history of bioprosthetic valve replacement, mitral valve repair, Atrial fibrillation and has been on chronic anticoagulation with Coumadin.   # Her stool occult test have been negative as well as urine test negative for hematuria.  Tests were done at [facility] clinic. # s/p IV Feraheme 510mg  x 2.   INTERVAL HISTORY BREIANNA DELFINO is a 84 y.o. female who has above history reviewed by me today presents for follow up visit for management of iron deficiency anemia.  Fatigue: was better immediately after IV Feraheme, however, fatigue get worse again. Chronic onset, perisistent, no aggravating or improving factors, no associated  symptoms.  Also reports new onset of easy bruising, showed me an area of bruising on right arm, acute onset.  No aggravating factors including trauma.  Context: She takes Warfarin 2.5mg  daily and has been follow up with PCP for INR checking. She was suppose to get INR checked last week but did not go. She planned to get INR checked today.  Denies any associated symptoms includes hematochezia, hematuria, hematemesis, epistaxis, black tarry stool.    Review of Systems  Constitutional: Positive for malaise/fatigue. Negative for chills, fever and weight loss.  HENT: Negative for congestion, ear discharge, ear pain, hearing loss, nosebleeds, sinus pain, sore throat and tinnitus.   Eyes: Negative for blurred vision, double vision, photophobia, pain, discharge and redness.  Respiratory: Negative for cough, hemoptysis, sputum production, shortness of breath and wheezing.   Cardiovascular: Negative for chest pain, palpitations, orthopnea, claudication and leg swelling.  Gastrointestinal: Negative for abdominal pain, blood in stool, constipation, diarrhea, heartburn, melena, nausea and vomiting.  Genitourinary: Positive for frequency. Negative for dysuria, flank pain, hematuria and urgency.  Musculoskeletal: Negative for back pain, myalgias and neck pain.  Skin: Negative for itching and rash.  Neurological: Negative for dizziness, tingling, tremors, sensory change, speech change, focal weakness, weakness and headaches.  Endo/Heme/Allergies: Negative for environmental allergies. Does not bruise/bleed easily.  Psychiatric/Behavioral: Negative for depression, hallucinations and substance abuse. The patient is not nervous/anxious.     MEDICAL HISTORY:  Past Medical History:  Diagnosis Date  . Anxiety and depression   . Atherosclerosis   . B12 deficiency   . Degenerative joint disease (DJD) of hip   . Fibromyalgia syndrome   . HH (hiatus hernia)   .  Hypertension   . IBS (irritable bowel syndrome)     . Osteopenia   . Paroxysmal A-fib (HCC)   . Sinoatrial node dysfunction (HCC)    S/p pacemaker implant 04/17/08. Atrial lead revision 06/08/08    . 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 APPENDECTOMY  1952  . CARDIAC VALVE REPLACEMENT    . LUMBAR LAMINECTOMY  1989  . PARTIAL HYSTERECTOMY  1972  . PLACEMENT OF BREAST IMPLANTS  1976  . REPLACEMENT TOTAL HIP W/  RESURFACING IMPLANTS  2015    SOCIAL HISTORY: Social History   Socioeconomic History  . Marital status: Married    Spouse name: Not on file  . Number of children: Not on file  . Years of education: Not on file  . Highest education level: Not on file  Occupational History  . Not on file  Social Needs  . Financial resource strain: Not on file  . Food insecurity:    Worry: Not on file    Inability: Not on file  . Transportation needs:    Medical: Not on file    Non-medical: Not on file  Tobacco Use  . Smoking status: Never Smoker  . Smokeless tobacco: Never Used  Substance and Sexual Activity  . Alcohol use: Not on file  . Drug use: Not on file  . Sexual activity: Not on file  Lifestyle  . Physical activity:    Days per week: Not on file    Minutes per session: Not on file  . Stress: Not on file  Relationships  . Social connections:    Talks on phone: Not on file    Gets together: Not on file    Attends religious service: Not on file    Active member of club or organization: Not on file    Attends meetings of clubs or organizations: Not on file    Relationship status: Not on file  . Intimate partner violence:    Fear of current or ex partner: Not on file    Emotionally abused: Not on file    Physically abused: Not on file    Forced sexual activity: Not on file  Other Topics Concern  . Not on  file  Social History Narrative  . Not on file    FAMILY HISTORY: Family History  Problem Relation Age of Onset  . Lung cancer Father   . Lung cancer Brother   . Breast cancer Daughter     ALLERGIES:  is allergic to amlodipine; diltiazem hcl; duloxetine; other; penicillin g; shellfish allergy; doxycycline; hydrocortisone; and prednisone.  MEDICATIONS:  Current Outpatient Medications  Medication Sig Dispense Refill  . acetaminophen (TYLENOL) 500 MG tablet Take 500 mg by mouth every 6 (six) hours as needed.    Marland Kitchen losartan (COZAAR) 100 MG tablet Take by mouth.     . metoprolol succinate (TOPROL-XL) 25 MG 24 hr tablet     . MULTAQ 400 MG tablet     . Multiple Vitamins-Minerals (MULTIVITAMIN PO) Take by mouth.    . warfarin (COUMADIN) 2.5 MG tablet Take by mouth.     No current facility-administered medications for this visit.      PHYSICAL EXAMINATION: ECOG PERFORMANCE STATUS: 1 - Symptomatic but completely ambulatory Vitals:   08/18/17 1336  BP: Marland Kitchen)  141/73  Pulse: 76  Resp: 18  Temp: 97.8 F (36.6 C)   Filed Weights   08/18/17 1336  Weight: 136 lb 6 oz (61.9 kg)    Physical Exam  Constitutional: She is oriented to person, place, and time and well-developed, well-nourished, and in no distress. No distress.  Pallor  HENT:  Head: Normocephalic and atraumatic.  Mouth/Throat: Oropharynx is clear and moist. No oropharyngeal exudate.  Eyes: Pupils are equal, round, and reactive to light. EOM are normal. Left eye exhibits no discharge. No scleral icterus.  Neck: Normal range of motion. Neck supple. No JVD present.  Cardiovascular: Normal rate. Exam reveals no friction rub.  No murmur heard. Irregular rhythm.   Pulmonary/Chest: Breath sounds normal. No respiratory distress. She has no wheezes.  Abdominal: Bowel sounds are normal. There is no tenderness. There is no rebound.  Musculoskeletal: Normal range of motion. She exhibits no edema.  Lymphadenopathy:    She has no  cervical adenopathy.  Neurological: She is alert and oriented to person, place, and time. No cranial nerve deficit.  Skin: Skin is warm and dry. No rash noted. She is not diaphoretic. No erythema.  Right arm bruise 4cm x 5cm. No hematoma.   Psychiatric: Affect and judgment normal.     LABORATORY DATA:  I have reviewed the data as listed [facility] 03/26/2017, ferritin 9, TIBC 514.2, transferrin 367.3, percentage saturation 6, CBC showed hemoglobin 8.1, hematocrit 26.8, MCV 92.7, WBC 4.1. TSH 3.49. Chemistry revealed normal bilirubin 0.6, creatinine 1, BUN 21. 03/18/2017 UA negative for hematuria.  ASSESSMENT & PLAN:  1. Iron deficiency anemia, unspecified iron deficiency anemia type   2. Easy bruising   3. Chronic anticoagulation   4. Macrocytosis    # Iron deficiency Anemia: slightly worse, Iron store continue to decreased.  Will give one dose of IV feraheme today.  Discussed with patient about seeing GI for work up of possible bleeding source.   She also brought report of previous colonoscopy and EGD and I reviewed it.  EGD and Colonoscopy in 2013 showed gastritis and diverticulosis. Patient is not interested in GI work up and declined.  # Easy bruising in the context of chronic anticoagulation with coumadin: will repeat her INR STAT.  INR therapeutic today. I called patient and communicated results and advise patient to continue on same dose of coumadin.    # Fatigue: likely multifactorial, including iron deficiency. Anticipate will improved with IV feraheme.   All questions were answered. The patient knows to call the clinic with any problems questions or concerns.  Return of visit: 3 months with repeat CBC, iron, TIBC ferritin.  Rickard PatienceZhou Sarie Stall, MD, PhD Hematology [facility] Pager- [phone] 08/18/2017

## 2017-08-18 NOTE — Progress Notes (Signed)
Pt in for follow up, reports still "very weak and tired all the time. Pt has a large bruise on upper right arm, states has had some pain but had not fallen, unsure of how it got bruised.

## 2017-08-18 NOTE — Progress Notes (Signed)
Pt tolerated infusion well. Pt and VS stable at discharge.  

## 2017-11-18 ENCOUNTER — Other Ambulatory Visit: Payer: Self-pay | Admitting: *Deleted

## 2017-11-18 DIAGNOSIS — Z7901 Long term (current) use of anticoagulants: Secondary | ICD-10-CM

## 2017-11-22 ENCOUNTER — Other Ambulatory Visit: Payer: Self-pay

## 2017-11-22 ENCOUNTER — Inpatient Hospital Stay: Payer: Medicare Other | Attending: Oncology

## 2017-11-22 DIAGNOSIS — R5383 Other fatigue: Secondary | ICD-10-CM | POA: Diagnosis not present

## 2017-11-22 DIAGNOSIS — Z7901 Long term (current) use of anticoagulants: Secondary | ICD-10-CM | POA: Insufficient documentation

## 2017-11-22 DIAGNOSIS — D508 Other iron deficiency anemias: Secondary | ICD-10-CM | POA: Insufficient documentation

## 2017-11-22 DIAGNOSIS — D509 Iron deficiency anemia, unspecified: Secondary | ICD-10-CM

## 2017-11-22 LAB — CBC WITH DIFFERENTIAL/PLATELET
BASOS PCT: 1 %
Basophils Absolute: 0.1 10*3/uL (ref 0–0.1)
EOS PCT: 3 %
Eosinophils Absolute: 0.1 10*3/uL (ref 0–0.7)
HEMATOCRIT: 31.8 % — AB (ref 35.0–47.0)
Hemoglobin: 10.8 g/dL — ABNORMAL LOW (ref 12.0–16.0)
Lymphocytes Relative: 22 %
Lymphs Abs: 1 10*3/uL (ref 1.0–3.6)
MCH: 34.2 pg — ABNORMAL HIGH (ref 26.0–34.0)
MCHC: 33.9 g/dL (ref 32.0–36.0)
MCV: 101.1 fL — ABNORMAL HIGH (ref 80.0–100.0)
MONO ABS: 0.3 10*3/uL (ref 0.2–0.9)
Monocytes Relative: 7 %
NEUTROS ABS: 3.1 10*3/uL (ref 1.4–6.5)
Neutrophils Relative %: 67 %
Platelets: 212 10*3/uL (ref 150–440)
RBC: 3.14 MIL/uL — ABNORMAL LOW (ref 3.80–5.20)
RDW: 14.5 % (ref 11.5–14.5)
WBC: 4.6 10*3/uL (ref 3.6–11.0)

## 2017-11-22 LAB — IRON AND TIBC
IRON: 50 ug/dL (ref 28–170)
SATURATION RATIOS: 16 % (ref 10.4–31.8)
TIBC: 320 ug/dL (ref 250–450)
UIBC: 270 ug/dL

## 2017-11-22 LAB — FERRITIN: FERRITIN: 60 ng/mL (ref 11–307)

## 2017-11-22 LAB — PROTIME-INR
INR: 3.2
Prothrombin Time: 32.5 seconds — ABNORMAL HIGH (ref 11.4–15.2)

## 2017-11-24 ENCOUNTER — Encounter: Payer: Self-pay | Admitting: Oncology

## 2017-11-24 ENCOUNTER — Other Ambulatory Visit: Payer: Self-pay

## 2017-11-24 ENCOUNTER — Inpatient Hospital Stay: Payer: Medicare Other

## 2017-11-24 ENCOUNTER — Inpatient Hospital Stay (HOSPITAL_BASED_OUTPATIENT_CLINIC_OR_DEPARTMENT_OTHER): Payer: Medicare Other | Admitting: Oncology

## 2017-11-24 VITALS — BP 134/73 | HR 66 | Resp 18 | Wt 134.0 lb

## 2017-11-24 DIAGNOSIS — R5383 Other fatigue: Secondary | ICD-10-CM

## 2017-11-24 DIAGNOSIS — D508 Other iron deficiency anemias: Secondary | ICD-10-CM | POA: Diagnosis not present

## 2017-11-24 DIAGNOSIS — Z7901 Long term (current) use of anticoagulants: Secondary | ICD-10-CM | POA: Diagnosis not present

## 2017-11-24 NOTE — Progress Notes (Signed)
Hematology/Oncology Follow up note [facility] Telephone:(336) [phone] Fax:(336) [phone]   Patient Care Team: Patrice Paradise, MD as PCP - General (Physician Assistant)  CHIEF COMPLAINTS/PURPOSE OF CONSULTATION:  Evaluation of fatigue and iron deficiency anemia  HISTORY OF PRESENTING ILLNESS:  Tina Barron is a  84 y.o.  female with PMH listed below who was referred to me for evaluation of iron deficiency anemia and fatigue. Patient reports that she was seen by a hematologist remotely( many years ago) for iron deficiency anemia. And she has got IV iron infusion 2. She couldn't remember the products she got. She recently had lab work down with her primary care physician as workup of fatigue. Extensive medical history review performed by me. 03/26/2017, ferritin 9, TIBC 514.2, transferrin 367.3, percentage saturation 6, CBC showed hemoglobin 8.1, hematocrit 26.8, MCV 92.7, WBC 4.1. TSH 3.49. Chemistry revealed normal bilirubin 0.6, creatinine 1, BUN 21. Patient reports profoundly weak. She lives by herself. She doesn't have much of appetite. Her husband passed away at 8 years ago, and she doesn't have interest o'clock to much for herself. She had a colonoscopy done at the [facility]. She denies seeing any blood in the stool. She has a history of bioprosthetic valve replacement, mitral valve repair, Atrial fibrillation and has been on chronic anticoagulation with Coumadin.   # Her stool occult test have been negative as well as urine test negative for hematuria.  Tests were done at [facility] clinic. # s/p IV Feraheme 510mg  x 2.   INTERVAL HISTORY Tina Barron is a 84 y.o. female who has above history reviewed by me today presents for follow up visit for iron deficiency anemia.   # Fatigue: feeling fatigue is slightly improved.   # Chronic anticoagulation, takes coumadin. Denies active bleeding events. + easy bruising.  Denies any associated symptoms  includes hematochezia, hematuria, hematemesis, epistaxis, black tarry stool.    Review of Systems  Constitutional: Positive for malaise/fatigue. Negative for chills, fever and weight loss.  HENT: Negative for congestion, ear discharge, ear pain, hearing loss, nosebleeds, sinus pain, sore throat and tinnitus.   Eyes: Negative for blurred vision, double vision, photophobia, pain, discharge and redness.  Respiratory: Negative for cough, hemoptysis, sputum production, shortness of breath and wheezing.   Cardiovascular: Negative for chest pain, palpitations, orthopnea, claudication and leg swelling.  Gastrointestinal: Negative for abdominal pain, blood in stool, constipation, diarrhea, heartburn, melena, nausea and vomiting.  Genitourinary: Negative for dysuria, flank pain, frequency, hematuria and urgency.  Musculoskeletal: Negative for back pain, myalgias and neck pain.  Skin: Negative for itching and rash.  Neurological: Negative for dizziness, tingling, tremors, sensory change, speech change, focal weakness, weakness and headaches.  Endo/Heme/Allergies: Negative for environmental allergies. Bruises/bleeds easily.  Psychiatric/Behavioral: Negative for depression, hallucinations and substance abuse. The patient is not nervous/anxious.     MEDICAL HISTORY:  Past Medical History:  Diagnosis Date  . Anxiety and depression   . Atherosclerosis   . B12 deficiency   . Degenerative joint disease (DJD) of hip   . Fibromyalgia syndrome   . HH (hiatus hernia)   . Hypertension   . IBS (irritable bowel syndrome)   . Osteopenia   . Paroxysmal A-fib (HCC)   . Sinoatrial node dysfunction (HCC)    S/p pacemaker implant 04/17/08. Atrial lead revision 06/08/08    . 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 APPENDECTOMY  1952  . CARDIAC VALVE REPLACEMENT    . LUMBAR LAMINECTOMY  1989  . PARTIAL HYSTERECTOMY  1972  . PLACEMENT OF BREAST IMPLANTS  1976  . REPLACEMENT TOTAL HIP W/  RESURFACING IMPLANTS  2015    SOCIAL HISTORY: Social History   Socioeconomic History  . Marital status: Married    Spouse name: Not on file  . Number of children: Not on file  . Years of education: Not on file  . Highest education level: Not on file  Occupational History  . Not on file  Social Needs  . Financial resource strain: Not on file  . Food insecurity:    Worry: Not on file    Inability: Not on file  . Transportation needs:    Medical: Not on file    Non-medical: Not on file  Tobacco Use  . Smoking status: Never Smoker  . Smokeless tobacco: Never Used  Substance and Sexual Activity  . Alcohol use: Not on file  . Drug use: Not on file  . Sexual activity: Not on file  Lifestyle  . Physical activity:    Days per week: Not on file    Minutes per session: Not on file  . Stress: Not on file  Relationships  . Social connections:    Talks on phone: Not on file    Gets together: Not on file    Attends religious service: Not on file    Active member of club or organization: Not on file    Attends meetings of clubs or organizations: Not on file    Relationship status: Not on file  . Intimate partner violence:    Fear of current or ex partner: Not on file    Emotionally abused: Not on file    Physically abused: Not on file    Forced sexual activity: Not on file  Other Topics Concern  . Not on file  Social History Narrative  . Not on file    FAMILY HISTORY: Family History  Problem Relation Age of Onset  . Lung cancer Father   . Lung cancer Brother   . Breast cancer Daughter     ALLERGIES:  is allergic to amlodipine; diltiazem hcl; duloxetine; other; penicillin g; shellfish allergy; doxycycline; hydrocortisone; and prednisone.  MEDICATIONS:  Current Outpatient  Medications  Medication Sig Dispense Refill  . acetaminophen (TYLENOL) 500 MG tablet Take 500 mg by mouth every 6 (six) hours as needed.    Marland Kitchen losartan (COZAAR) 100 MG tablet Take by mouth.     . metoprolol succinate (TOPROL-XL) 25 MG 24 hr tablet     . MULTAQ 400 MG tablet     . Multiple Vitamins-Minerals (MULTIVITAMIN PO) Take by mouth.    . warfarin (COUMADIN) 2.5 MG tablet Take by mouth.     No current facility-administered medications for this visit.      PHYSICAL EXAMINATION: ECOG PERFORMANCE STATUS: 1 - Symptomatic but completely ambulatory Vitals:   11/24/17 1423  BP: 134/73  Pulse: 66  Resp: 18   Filed Weights   11/24/17 1423  Weight: 134 lb (60.8 kg)    Physical Exam  Constitutional: She is oriented to person, place, and time and well-developed, well-nourished, and in no distress. No distress.  Pallor  HENT:  Head: Normocephalic and atraumatic.  Nose: Nose normal.  Mouth/Throat: Oropharynx is clear and moist. No oropharyngeal  exudate.  Eyes: Pupils are equal, round, and reactive to light. EOM are normal. Left eye exhibits no discharge. No scleral icterus.  Neck: Normal range of motion. Neck supple. No JVD present.  Cardiovascular: Normal rate and regular rhythm. Exam reveals no friction rub.  No murmur heard. Irregular rhythm.   Pulmonary/Chest: Effort normal and breath sounds normal. No respiratory distress. She has no wheezes. She has no rales. She exhibits no tenderness.  Abdominal: Soft. Bowel sounds are normal. She exhibits no distension and no mass. There is no tenderness. There is no rebound.  Musculoskeletal: Normal range of motion. She exhibits no edema.  Lymphadenopathy:    She has no cervical adenopathy.  Neurological: She is alert and oriented to person, place, and time. No cranial nerve deficit. She exhibits normal muscle tone. Coordination normal.  Skin: Skin is warm and dry. No rash noted. She is not diaphoretic. No erythema.  Psychiatric: Affect  and judgment normal.     LABORATORY DATA:  I have reviewed the data as listed [facility] 03/26/2017, ferritin 9, TIBC 514.2, transferrin 367.3, percentage saturation 6, CBC showed hemoglobin 8.1, hematocrit 26.8, MCV 92.7, WBC 4.1. TSH 3.49. Chemistry revealed normal bilirubin 0.6, creatinine 1, BUN 21. 03/18/2017 UA negative for hematuria.  ASSESSMENT & PLAN:  1. Other iron deficiency anemia   2. Chronic anticoagulation    # Iron deficiency Anemia:  Labs reviewed and discussed with patient. Iron level has been stable and hemoglobin level stable.  Recommend patient to take oral iron supplementation.  Patient is not interested in GI work up.   # Chronic anticoagulation, INR is supretheraputic. Faxed results to patient's cardiologist office and patient knows to follow up with that office for titration.    All questions were answered. The patient knows to call the clinic with any problems questions or concerns.  Return of visit: 3 months with repeat CBC, iron, TIBC ferritin. Total face to face encounter time for this patient visit was 15 min. >50% of the time was  spent in counseling and coordination of care.  Rickard Patience, MD, PhD Hematology Oncology [facility] at [facility] Pager- [phone] 11/24/2017

## 2017-11-24 NOTE — Progress Notes (Signed)
Patient here for follow up. Complains feeling tired and weak.

## 2018-02-18 ENCOUNTER — Other Ambulatory Visit: Payer: Self-pay | Admitting: *Deleted

## 2018-02-18 DIAGNOSIS — D509 Iron deficiency anemia, unspecified: Secondary | ICD-10-CM

## 2018-02-21 ENCOUNTER — Other Ambulatory Visit: Payer: Self-pay | Admitting: Oncology

## 2018-02-21 ENCOUNTER — Inpatient Hospital Stay: Payer: Medicare Other | Attending: Oncology

## 2018-02-22 ENCOUNTER — Telehealth: Payer: Self-pay

## 2018-02-22 NOTE — Telephone Encounter (Signed)
Called and spoke to Mrs. Tina Barron regarding her missed lab appointment. She states she wanted to cancel appointment with Dr. Cathie HoopsYu, stating that she will talk to PCP first and then will call back cancer center if they decide it is necessary to reschedule.

## 2018-02-23 ENCOUNTER — Inpatient Hospital Stay: Payer: Medicare Other

## 2018-02-23 ENCOUNTER — Inpatient Hospital Stay: Payer: Medicare Other | Admitting: Oncology

## 2018-03-03 DIAGNOSIS — E782 Mixed hyperlipidemia: Secondary | ICD-10-CM | POA: Insufficient documentation

## 2018-09-02 ENCOUNTER — Ambulatory Visit
Admission: RE | Admit: 2018-09-02 | Discharge: 2018-09-02 | Disposition: A | Discharge status: Home / Self Care | Payer: Medicare Other | Source: Ambulatory Visit | Attending: Physician Assistant | Admitting: Physician Assistant

## 2018-09-02 ENCOUNTER — Other Ambulatory Visit: Payer: Self-pay

## 2018-09-02 ENCOUNTER — Other Ambulatory Visit: Payer: Self-pay | Admitting: Physician Assistant

## 2018-09-02 DIAGNOSIS — R509 Fever, unspecified: Secondary | ICD-10-CM | POA: Diagnosis present

## 2018-09-02 DIAGNOSIS — R1084 Generalized abdominal pain: Secondary | ICD-10-CM

## 2018-09-02 LAB — POCT I-STAT CREATININE: Creatinine, Ser: 1.1 mg/dL — ABNORMAL HIGH (ref 0.44–1.00)

## 2018-09-02 MED ORDER — IOHEXOL 300 MG/ML  SOLN
85.0000 mL | Freq: Once | INTRAMUSCULAR | Status: AC | PRN
  Administered 2018-09-02: 85 mL via INTRAVENOUS

## 2018-09-06 ENCOUNTER — Other Ambulatory Visit: Payer: Self-pay

## 2018-09-06 ENCOUNTER — Inpatient Hospital Stay: Payer: Medicare Other

## 2018-09-06 ENCOUNTER — Inpatient Hospital Stay
Admission: EM | Admit: 2018-09-06 | Discharge: 2018-09-16 | DRG: 378 | Disposition: A | Discharge status: Home / Self Care | Payer: Medicare Other | Attending: Internal Medicine | Admitting: Internal Medicine

## 2018-09-06 ENCOUNTER — Encounter: Payer: Self-pay | Admitting: Emergency Medicine

## 2018-09-06 DIAGNOSIS — Z7901 Long term (current) use of anticoagulants: Secondary | ICD-10-CM

## 2018-09-06 DIAGNOSIS — R1011 Right upper quadrant pain: Secondary | ICD-10-CM

## 2018-09-06 DIAGNOSIS — Z801 Family history of malignant neoplasm of trachea, bronchus and lung: Secondary | ICD-10-CM

## 2018-09-06 DIAGNOSIS — R1084 Generalized abdominal pain: Secondary | ICD-10-CM | POA: Diagnosis not present

## 2018-09-06 DIAGNOSIS — K5521 Angiodysplasia of colon with hemorrhage: Principal | ICD-10-CM | POA: Diagnosis present

## 2018-09-06 DIAGNOSIS — Y92009 Unspecified place in unspecified non-institutional (private) residence as the place of occurrence of the external cause: Secondary | ICD-10-CM

## 2018-09-06 DIAGNOSIS — Z79899 Other long term (current) drug therapy: Secondary | ICD-10-CM

## 2018-09-06 DIAGNOSIS — Z20828 Contact with and (suspected) exposure to other viral communicable diseases: Secondary | ICD-10-CM | POA: Diagnosis present

## 2018-09-06 DIAGNOSIS — M797 Fibromyalgia: Secondary | ICD-10-CM | POA: Diagnosis present

## 2018-09-06 DIAGNOSIS — I480 Paroxysmal atrial fibrillation: Secondary | ICD-10-CM | POA: Diagnosis present

## 2018-09-06 DIAGNOSIS — K589 Irritable bowel syndrome without diarrhea: Secondary | ICD-10-CM | POA: Diagnosis present

## 2018-09-06 DIAGNOSIS — K449 Diaphragmatic hernia without obstruction or gangrene: Secondary | ICD-10-CM | POA: Diagnosis present

## 2018-09-06 DIAGNOSIS — R791 Abnormal coagulation profile: Secondary | ICD-10-CM | POA: Diagnosis present

## 2018-09-06 DIAGNOSIS — N179 Acute kidney failure, unspecified: Secondary | ICD-10-CM | POA: Diagnosis not present

## 2018-09-06 DIAGNOSIS — Z803 Family history of malignant neoplasm of breast: Secondary | ICD-10-CM

## 2018-09-06 DIAGNOSIS — Z90711 Acquired absence of uterus with remaining cervical stump: Secondary | ICD-10-CM

## 2018-09-06 DIAGNOSIS — R109 Unspecified abdominal pain: Secondary | ICD-10-CM

## 2018-09-06 DIAGNOSIS — K5732 Diverticulitis of large intestine without perforation or abscess without bleeding: Secondary | ICD-10-CM | POA: Diagnosis present

## 2018-09-06 DIAGNOSIS — M8580 Other specified disorders of bone density and structure, unspecified site: Secondary | ICD-10-CM | POA: Diagnosis present

## 2018-09-06 DIAGNOSIS — Z91013 Allergy to seafood: Secondary | ICD-10-CM

## 2018-09-06 DIAGNOSIS — I10 Essential (primary) hypertension: Secondary | ICD-10-CM | POA: Diagnosis present

## 2018-09-06 DIAGNOSIS — T45515A Adverse effect of anticoagulants, initial encounter: Secondary | ICD-10-CM | POA: Diagnosis present

## 2018-09-06 DIAGNOSIS — Z96649 Presence of unspecified artificial hip joint: Secondary | ICD-10-CM | POA: Diagnosis present

## 2018-09-06 DIAGNOSIS — K922 Gastrointestinal hemorrhage, unspecified: Secondary | ICD-10-CM | POA: Diagnosis not present

## 2018-09-06 DIAGNOSIS — Z950 Presence of cardiac pacemaker: Secondary | ICD-10-CM

## 2018-09-06 DIAGNOSIS — Z881 Allergy status to other antibiotic agents status: Secondary | ICD-10-CM

## 2018-09-06 DIAGNOSIS — Z9882 Breast implant status: Secondary | ICD-10-CM

## 2018-09-06 DIAGNOSIS — Z952 Presence of prosthetic heart valve: Secondary | ICD-10-CM

## 2018-09-06 DIAGNOSIS — I495 Sick sinus syndrome: Secondary | ICD-10-CM | POA: Diagnosis present

## 2018-09-06 DIAGNOSIS — Z880 Allergy status to penicillin: Secondary | ICD-10-CM | POA: Diagnosis not present

## 2018-09-06 DIAGNOSIS — D62 Acute posthemorrhagic anemia: Secondary | ICD-10-CM | POA: Diagnosis present

## 2018-09-06 DIAGNOSIS — Z888 Allergy status to other drugs, medicaments and biological substances status: Secondary | ICD-10-CM | POA: Diagnosis not present

## 2018-09-06 DIAGNOSIS — K8020 Calculus of gallbladder without cholecystitis without obstruction: Secondary | ICD-10-CM | POA: Diagnosis present

## 2018-09-06 LAB — COMPREHENSIVE METABOLIC PANEL
ALT: 11 U/L (ref 0–44)
AST: 19 U/L (ref 15–41)
Albumin: 3.8 g/dL (ref 3.5–5.0)
Alkaline Phosphatase: 50 U/L (ref 38–126)
Anion gap: 8 (ref 5–15)
BUN: 29 mg/dL — ABNORMAL HIGH (ref 8–23)
CO2: 22 mmol/L (ref 22–32)
Calcium: 9.2 mg/dL (ref 8.9–10.3)
Chloride: 109 mmol/L (ref 98–111)
Creatinine, Ser: 1.42 mg/dL — ABNORMAL HIGH (ref 0.44–1.00)
GFR calc Af Amer: 39 mL/min — ABNORMAL LOW (ref >60)
GFR calc non Af Amer: 34 mL/min — ABNORMAL LOW (ref >60)
Glucose, Bld: 93 mg/dL (ref 70–99)
Potassium: 4.2 mmol/L (ref 3.5–5.1)
Sodium: 139 mmol/L (ref 135–145)
Total Bilirubin: 0.7 mg/dL (ref 0.3–1.2)
Total Protein: 7.2 g/dL (ref 6.5–8.1)

## 2018-09-06 LAB — CBC WITH DIFFERENTIAL/PLATELET
Abs Immature Granulocytes: 0.11 10*3/uL — ABNORMAL HIGH (ref 0.00–0.07)
Basophils Absolute: 0 10*3/uL (ref 0.0–0.1)
Basophils Relative: 0 %
Eosinophils Absolute: 0.1 10*3/uL (ref 0.0–0.5)
Eosinophils Relative: 2 %
HCT: 29.2 % — ABNORMAL LOW (ref 36.0–46.0)
Hemoglobin: 9.3 g/dL — ABNORMAL LOW (ref 12.0–15.0)
Immature Granulocytes: 2 %
Lymphocytes Relative: 13 %
Lymphs Abs: 0.7 10*3/uL (ref 0.7–4.0)
MCH: 32.1 pg (ref 26.0–34.0)
MCHC: 31.8 g/dL (ref 30.0–36.0)
MCV: 100.7 fL — ABNORMAL HIGH (ref 80.0–100.0)
Monocytes Absolute: 0.3 10*3/uL (ref 0.1–1.0)
Monocytes Relative: 7 %
Neutro Abs: 3.9 10*3/uL (ref 1.7–7.7)
Neutrophils Relative %: 76 %
Platelets: 221 10*3/uL (ref 150–400)
RBC: 2.9 MIL/uL — ABNORMAL LOW (ref 3.87–5.11)
RDW: 14.8 % (ref 11.5–15.5)
WBC: 5.2 10*3/uL (ref 4.0–10.5)
nRBC: 0 % (ref 0.0–0.2)

## 2018-09-06 LAB — PROTIME-INR
INR: 6.4 (ref 0.8–1.2)
Prothrombin Time: 55.5 seconds — ABNORMAL HIGH (ref 11.4–15.2)

## 2018-09-06 LAB — APTT: aPTT: 58 seconds — ABNORMAL HIGH (ref 24–36)

## 2018-09-06 LAB — TYPE AND SCREEN
ABO/RH(D): O POS
Antibody Screen: NEGATIVE

## 2018-09-06 LAB — HEMOGLOBIN: Hemoglobin: 9.5 g/dL — ABNORMAL LOW (ref 12.0–15.0)

## 2018-09-06 LAB — SARS CORONAVIRUS 2 BY RT PCR (HOSPITAL ORDER, PERFORMED IN ~~LOC~~ HOSPITAL LAB): SARS Coronavirus 2: NEGATIVE

## 2018-09-06 MED ORDER — ADULT MULTIVITAMIN W/MINERALS CH
1.0000 | ORAL_TABLET | Freq: Every day | ORAL | Status: DC
  Administered 2018-09-07 – 2018-09-16 (×7): 1 via ORAL
  Filled 2018-09-06 (×8): qty 1

## 2018-09-06 MED ORDER — ONDANSETRON HCL 4 MG PO TABS
4.0000 mg | ORAL_TABLET | Freq: Four times a day (QID) | ORAL | Status: DC | PRN
  Filled 2018-09-06: qty 1

## 2018-09-06 MED ORDER — METOPROLOL SUCCINATE ER 25 MG PO TB24
12.5000 mg | ORAL_TABLET | Freq: Every day | ORAL | Status: DC
  Administered 2018-09-06 – 2018-09-08 (×3): 12.5 mg via ORAL
  Filled 2018-09-06 (×3): qty 1
  Filled 2018-09-06: qty 0.5

## 2018-09-06 MED ORDER — OXYCODONE HCL 5 MG PO TABS
5.0000 mg | ORAL_TABLET | ORAL | Status: DC | PRN
  Administered 2018-09-15: 5 mg via ORAL
  Filled 2018-09-06: qty 1

## 2018-09-06 MED ORDER — METOPROLOL SUCCINATE ER 25 MG PO TB24
25.0000 mg | ORAL_TABLET | Freq: Every morning | ORAL | Status: DC
  Administered 2018-09-07 – 2018-09-08 (×2): 25 mg via ORAL
  Filled 2018-09-06 (×2): qty 1

## 2018-09-06 MED ORDER — ACETAMINOPHEN 325 MG PO TABS: 650.0000 mg | ORAL_TABLET | Freq: Four times a day (QID) | ORAL | Status: DC | PRN

## 2018-09-06 MED ORDER — LOSARTAN POTASSIUM 50 MG PO TABS
50.0000 mg | ORAL_TABLET | Freq: Every day | ORAL | Status: DC
  Administered 2018-09-07: 50 mg via ORAL
  Filled 2018-09-06 (×2): qty 1

## 2018-09-06 MED ORDER — METOPROLOL SUCCINATE ER 50 MG PO TB24: 25.0000 mg | ORAL_TABLET | Freq: Every day | ORAL | Status: DC

## 2018-09-06 MED ORDER — PHYTONADIONE 5 MG PO TABS
10.0000 mg | ORAL_TABLET | Freq: Once | ORAL | Status: AC
  Administered 2018-09-06: 10 mg via ORAL
  Filled 2018-09-06: qty 2

## 2018-09-06 MED ORDER — PANTOPRAZOLE SODIUM 40 MG IV SOLR
40.0000 mg | Freq: Two times a day (BID) | INTRAVENOUS | Status: AC
  Administered 2018-09-06 – 2018-09-15 (×20): 40 mg via INTRAVENOUS
  Filled 2018-09-06 (×21): qty 40

## 2018-09-06 MED ORDER — MULTIVITAMIN PO LIQD: 1.0000 | Freq: Every morning | ORAL | Status: DC

## 2018-09-06 MED ORDER — ACETAMINOPHEN 650 MG RE SUPP
650.0000 mg | Freq: Four times a day (QID) | RECTAL | Status: DC | PRN
  Filled 2018-09-06: qty 1

## 2018-09-06 MED ORDER — ONDANSETRON HCL 4 MG/2ML IJ SOLN: 4.0000 mg | Freq: Four times a day (QID) | INTRAMUSCULAR | Status: DC | PRN

## 2018-09-06 MED ORDER — DRONEDARONE HCL 400 MG PO TABS
400.0000 mg | ORAL_TABLET | Freq: Two times a day (BID) | ORAL | Status: DC
  Administered 2018-09-06 – 2018-09-16 (×18): 400 mg via ORAL
  Filled 2018-09-06 (×21): qty 1

## 2018-09-06 MED ORDER — SODIUM CHLORIDE 0.9 % IV BOLUS
500.0000 mL | Freq: Once | INTRAVENOUS | Status: AC
  Administered 2018-09-06: 500 mL via INTRAVENOUS

## 2018-09-06 NOTE — ED Notes (Signed)
Pt to xray

## 2018-09-06 NOTE — ED Provider Notes (Signed)
[facility] Emergency Department Provider Note   ____________________________________________    I have reviewed the triage vital signs and the nursing notes.   HISTORY  Chief Complaint Rectal Bleeding, Weakness, and Abdominal Pain     HPI Tina Barron is a 85 y.o. female who presents with reports of weakness, dark stools for nearly 1 week.  The patient is apparently on Coumadin, she has had a valve replacement and has atrial fibrillation.  She denies abdominal pain.  No chest pain or shortness of breath.  No cough fevers or chills.  No nausea or vomiting.  Told by her PCP to come to the emergency department for evaluation of possible GI bleed  Past Medical History:  Diagnosis Date  . Anxiety and depression   . Atherosclerosis   . B12 deficiency   . Degenerative joint disease (DJD) of hip   . Fibromyalgia syndrome   . HH (hiatus hernia)   . Hypertension   . IBS (irritable bowel syndrome)   . Osteopenia   . Paroxysmal A-fib (HCC)   . Sinoatrial node dysfunction (HCC)    S/p pacemaker implant 04/17/08. Atrial lead revision 06/08/08    . 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 GI bleed 09/06/2018  . Iron deficiency anemia 04/01/2017  . Essential hypertension, benign 08/21/2016  . Leg pain 08/21/2016  . Aortic atherosclerosis (HCC) 08/21/2016  . Anemia 01/22/2016  . Atrial fibrillation (HCC) 01/22/2016  . Paroxysmal A-fib (HCC) 11/06/2014    Past Surgical History:  Procedure Laterality Date  . APPENDECTOMY  1952  . CARDIAC VALVE REPLACEMENT    . LUMBAR LAMINECTOMY  1989  . PARTIAL HYSTERECTOMY  1972  . PLACEMENT OF BREAST IMPLANTS  1976  . REPLACEMENT TOTAL HIP W/  RESURFACING IMPLANTS  2015    Prior to Admission medications   Medication  Sig Start Date End Date Taking? Authorizing Provider  acetaminophen (TYLENOL) 500 MG tablet Take 500 mg by mouth every 6 (six) hours as needed.   Yes [provider]  losartan (COZAAR) 50 MG tablet Take 50 mg by mouth daily.    Yes [provider]  metoprolol succinate (TOPROL-XL) 25 MG 24 hr tablet Take 12.5-25 mg by mouth 2 (two) times a day. Take 25 mg in the morning and 12.5 mg at bedtime 12/13/15  Yes [provider]  MULTAQ 400 MG tablet Take 400 mg by mouth 2 (two) times daily with a meal.  12/17/15  Yes [provider]  Multiple Vitamins-Minerals (MULTIVITAMIN PO) Take by mouth.   Yes [provider]  warfarin (COUMADIN) 2.5 MG tablet Take 2.5 mg by mouth one time only at 6 PM.  07/09/15  Yes [provider]     Allergies Amlodipine, Diltiazem hcl, Duloxetine, Other, Penicillin g, Shellfish allergy, Doxycycline, Hydrocortisone, and Prednisone  Family History  Problem Relation Age of Onset  . Lung cancer Father   . Lung cancer Brother   . Breast cancer Daughter   . CVA Mother     Social History Social History   Tobacco Use  . Smoking status: Never Smoker  . Smokeless tobacco: Never Used  Substance Use Topics  . Alcohol use: Not on file  . Drug use: Not on file    Review  of Systems  Constitutional: No fever/chills Eyes: No visual changes.  ENT: No sore throat. Cardiovascular: Denies chest pain. Respiratory: Denies shortness of breath. Gastrointestinal: As above Genitourinary: Negative for dysuria. Musculoskeletal: Negative for back pain. Skin: Negative for rash. Neurological: Negative for headaches or weakness   ____________________________________________   PHYSICAL EXAM:  VITAL SIGNS: ED Triage Vitals  Enc Vitals Group     BP 09/06/18 1034 (!) 145/57     Pulse Rate 09/06/18 1034 72     Resp 09/06/18 1034 18     Temp 09/06/18 1034 98.6 F (37 C)     Temp Source 09/06/18 1034 Oral     SpO2 09/06/18  1034 100 %     Weight 09/06/18 1035 56.7 kg (125 lb)     Height 09/06/18 1035 1.702 m (5\' 7" )     Head Circumference --      Peak Flow --      Pain Score 09/06/18 1035 6     Pain Loc --      Pain Edu? --      Excl. in GC? --     Constitutional: Alert and oriented.  Eyes: Conjunctivae are normal.   Nose: No congestion/rhinnorhea. Mouth/Throat: Mucous membranes are moist.    Cardiovascular: Normal rate, regular rhythm.  Good peripheral circulation. Respiratory: Normal respiratory effort.  No retractions. Lungs CTAB. Gastrointestinal: Mild epigastric tenderness, no distention.  No CVA tenderness.  Black stool guaiac positive  Musculoskeletal: Warm and well perfused Neurologic:  Normal speech and language. No gross focal neurologic deficits are appreciated.  Skin:  Skin is warm, dry and intact. No rash noted. Psychiatric: Mood and affect are normal. Speech and behavior are normal.  ____________________________________________   LABS (all labs ordered are listed, but only abnormal results are displayed)  Labs Reviewed  PROTIME-INR - Abnormal; Notable for the following components:      Result Value   Prothrombin Time 55.5 (*)    INR 6.4 (*)    All other components within normal limits  CBC WITH DIFFERENTIAL/PLATELET - Abnormal; Notable for the following components:   RBC 2.90 (*)    Hemoglobin 9.3 (*)    HCT 29.2 (*)    MCV 100.7 (*)    Abs Immature Granulocytes 0.11 (*)    All other components within normal limits  COMPREHENSIVE METABOLIC PANEL - Abnormal; Notable for the following components:   BUN 29 (*)    Creatinine, Ser 1.42 (*)    GFR calc non Af Amer 34 (*)    GFR calc Af Amer 39 (*)    All other components within normal limits  APTT - Abnormal; Notable for the following components:   aPTT 58 (*)    All other components within normal limits  SARS CORONAVIRUS 2 (HOSPITAL ORDER, PERFORMED IN [facility])  HEMOGLOBIN  TYPE AND SCREEN    ____________________________________________  EKG  ED ECG REPORT I, Jene Everyobert Yumi Insalaco, the attending physician, personally viewed and interpreted this ECG.  Date: 09/06/2018  Rhythm: normal sinus rhythm QRS Axis: normal Intervals: normal ST/T Wave abnormalities: normal  ____________________________________________  RADIOLOGY  None____________________________________________   PROCEDURES  Procedure(s) performed: No  Procedures   Critical Care performed: No ____________________________________________   INITIAL IMPRESSION / ASSESSMENT AND PLAN / ED COURSE  Pertinent labs & imaging results that were available during my care of the patient were reviewed by me and considered in my medical decision making (see chart for details).  Patient on Coumadin with reports of dark  stool, concerning for GI bleed, we will send type and screen, labs including CBC start the patient on IV fluids and carefully monitor.  Patient with hemoglobin of 9.3, this is about 1.5 g less than her baseline, INR 6.4 would not give vitamin K but will hold Coumadin as not life-threatening bleed.    ____________________________________________   FINAL CLINICAL IMPRESSION(S) / ED DIAGNOSES  Final diagnoses:  Gastrointestinal hemorrhage, unspecified gastrointestinal hemorrhage type  Elevated INR        Note:  This document was prepared using Dragon voice recognition software and may include unintentional dictation errors.   Lavonia Drafts, MD 09/06/18 [phone]

## 2018-09-06 NOTE — ED Notes (Signed)
ED TO INPATIENT HANDOFF REPORT  ED Nurse Name and Phone #: Deloros Beretta [phone]  S Name/Age/Gender Tina Barron 85 y.o. female Room/Bed: ED34A/ED34A  Code Status   Code Status: Full Code  Home/SNF/Other Home Patient oriented to: self, place, time and situation Is this baseline? Yes   Triage Complete: Triage complete  Chief Complaint sent by dr;internal bleeding  Triage Note Pt reports black tarry stools for the past week and was advised to come to the ED for evaluation by her PMD. Pt states she feels weak as well.    Allergies Allergies  Allergen Reactions  . Amlodipine Itching  . Diltiazem Hcl Hives  . Duloxetine Other (See Comments)    Hyperactivity   . Other Other (See Comments) and Hives    Uncoded Allergy. Allergen: Shellfish Uncoded Allergy. Allergen: Msg  . Penicillin G Itching  . Shellfish Allergy   . Doxycycline     Blurred vision  . Hydrocortisone Rash  . Prednisone Rash    Level of Care/Admitting Diagnosis ED Disposition    ED Disposition Condition Comment   Admit  [facility]ospital [facility] [[id number]]  Level of Care: Med-Surg [16]  Covid Evaluation: Person Under Investigation (PUI)  Isolation Risk Level: Low Risk/Droplet (Less than 4L [place] supplementation)  Diagnosis: GI bleed [[id number]][[id number]]  Admitting Physician: Alford HighlandWIETING, RICHARD [[id number]]  Attending Physician: Alford HighlandWIETING, RICHARD [phone][[id number]]  Estimated length of stay: past midnight tomorrow  Certification:: I certify this patient will need inpatient services for at least 2 midnights  PT Class (Do Not Modify): Inpatient [101]  PT Acc Code (Do Not Modify): Private [1]       B Medical/Surgery History Past Medical History:  Diagnosis Date  . Anxiety and depression   . Atherosclerosis   . B12 deficiency   . Degenerative joint disease (DJD) of hip   . Fibromyalgia syndrome   . HH (hiatus hernia)   . Hypertension   . IBS (irritable bowel syndrome)   . Osteopenia   . Paroxysmal A-fib (HCC)    . Sinoatrial node dysfunction (HCC)    S/p pacemaker implant 04/17/08. Atrial lead revision 06/08/08    . 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 APPENDECTOMY  1952  . CARDIAC VALVE REPLACEMENT    . LUMBAR LAMINECTOMY  1989  . PARTIAL HYSTERECTOMY  1972  . PLACEMENT OF BREAST IMPLANTS  1976  . REPLACEMENT TOTAL HIP W/  RESURFACING IMPLANTS  2015     A IV Location/Drains/Wounds Patient Lines/Drains/Airways Status   Active Line/Drains/Airways    Name:   Placement date:   Placement time:   Site:   Days:   Peripheral IV 09/06/18 Left Antecubital   09/06/18    1114    Antecubital   less than 1          Intake/Output Last 24 hours  Intake/Output Summary (Last 24 hours) at 09/06/2018 1659 Last data filed at 09/06/2018 1200 Gross per 24 hour  Intake 500 ml  Output -  Net 500 ml    Labs/Imaging Results for orders placed or performed during the hospital encounter of 09/06/18 (from the past 48 hour(s))  Protime-INR     Status: Abnormal   Collection Time: 09/06/18 10:55 AM  Result Value Ref Range   Prothrombin Time 55.5 (H) 11.4 - 15.2  seconds   INR 6.4 (HH) 0.8 - 1.2    Comment: RESULT REPEATED AND VERIFIED CRITICAL RESULT CALLED TO, READ BACK BY AND VERIFIED WITH: ALICIA GRAINGER AT 1217 ON 09/06/2018 [facility]. (NOTE) INR goal varies based on device and disease states. Performed at [facility], [address] [place], [place] [address]   CBC with Differential     Status: Abnormal   Collection Time: 09/06/18 10:55 AM  Result Value Ref Range   WBC 5.2 4.0 - 10.5 K/uL   RBC 2.90 (L) 3.87 - 5.11 MIL/uL   Hemoglobin 9.3 (L) 12.0 - 15.0 g/dL   HCT 56.229.2 (L) 13.036.0 - 86.546.0 %   MCV 100.7 (H) 80.0 - 100.0 fL   MCH 32.1 26.0 - 34.0 pg   MCHC 31.8 30.0 - 36.0 g/dL   RDW 78.414.8 69.611.5 - 29.515.5  %   Platelets 221 150 - 400 K/uL   nRBC 0.0 0.0 - 0.2 %   Neutrophils Relative % 76 %   Neutro Abs 3.9 1.7 - 7.7 K/uL   Lymphocytes Relative 13 %   Lymphs Abs 0.7 0.7 - 4.0 K/uL   Monocytes Relative 7 %   Monocytes Absolute 0.3 0.1 - 1.0 K/uL   Eosinophils Relative 2 %   Eosinophils Absolute 0.1 0.0 - 0.5 K/uL   Basophils Relative 0 %   Basophils Absolute 0.0 0.0 - 0.1 K/uL   Immature Granulocytes 2 %   Abs Immature Granulocytes 0.11 (H) 0.00 - 0.07 K/uL    Comment: Performed at [facility], [address] [place], [place] [address]  Comprehensive metabolic panel     Status: Abnormal   Collection Time: 09/06/18 10:55 AM  Result Value Ref Range   Sodium 139 135 - 145 mmol/L   Potassium 4.2 3.5 - 5.1 mmol/L   Chloride 109 98 - 111 mmol/L   CO2 22 22 - 32 mmol/L   Glucose, Bld 93 70 - 99 mg/dL   BUN 29 (H) 8 - 23 mg/dL   Creatinine, Ser 2.441.42 (H) 0.44 - 1.00 mg/dL   Calcium 9.2 8.9 - 01.010.3 mg/dL   Total Protein 7.2 6.5 - 8.1 g/dL   Albumin 3.8 3.5 - 5.0 g/dL   AST 19 15 - 41 U/L   ALT 11 0 - 44 U/L   Alkaline Phosphatase 50 38 - 126 U/L   Total Bilirubin 0.7 0.3 - 1.2 mg/dL   GFR calc non Af Amer 34 (L) >60 mL/min   GFR calc Af Amer 39 (L) >60 mL/min   Anion gap 8 5 - 15    Comment: Performed at [facility], [address] [place], [place] [address]  APTT     Status: Abnormal   Collection Time: 09/06/18 10:55 AM  Result Value Ref Range   aPTT 58 (H) 24 - 36 seconds    Comment:        IF BASELINE aPTT IS ELEVATED, SUGGEST PATIENT RISK ASSESSMENT BE USED TO DETERMINE APPROPRIATE ANTICOAGULANT THERAPY. Performed at [facility], [address] [place], [place] [address]   Type and screen [facility]     Status: None   Collection Time: 09/06/18 11:23 AM  Result Value Ref Range   ABO/RH(D) O POS    Antibody Screen NEG    Sample Expiration      09/09/2018,2359 Performed at [facility], [address]  [place], [place] [address]   SARS Coronavirus 2 (CEPHEID - Performed in [facility]  hospital lab), Hosp Order     Status: None   Collection Time: 09/06/18 12:32 PM   Specimen: Nasopharyngeal Swab  Result Value Ref Range   SARS Coronavirus 2 NEGATIVE NEGATIVE    Comment: (NOTE) If result is NEGATIVE SARS-CoV-2 target nucleic acids are NOT DETECTED. The SARS-CoV-2 RNA is generally detectable in upper and lower  respiratory specimens during the acute phase of infection. The lowest  concentration of SARS-CoV-2 viral copies this assay can detect is 250  copies / mL. A negative result does not preclude SARS-CoV-2 infection  and should not be used as the sole basis for treatment or other  patient management decisions.  A negative result may occur with  improper specimen collection / handling, submission of specimen other  than nasopharyngeal swab, presence of viral mutation(s) within the  areas targeted by this assay, and inadequate number of viral copies  (<250 copies / mL). A negative result must be combined with clinical  observations, patient history, and epidemiological information. If result is POSITIVE SARS-CoV-2 target nucleic acids are DETECTED. The SARS-CoV-2 RNA is generally detectable in upper and lower  respiratory specimens dur ing the acute phase of infection.  Positive  results are indicative of active infection with SARS-CoV-2.  Clinical  correlation with patient history and other diagnostic information is  necessary to determine patient infection status.  Positive results do  not rule out bacterial infection or co-infection with other viruses. If result is PRESUMPTIVE POSTIVE SARS-CoV-2 nucleic acids MAY BE PRESENT.   A presumptive positive result was obtained on the submitted specimen  and confirmed on repeat testing.  While 2019 novel coronavirus  (SARS-CoV-2) nucleic acids may be present in the submitted sample  additional confirmatory testing may be necessary for  epidemiological  and / or clinical management purposes  to differentiate between  SARS-CoV-2 and other Sarbecovirus currently known to infect humans.  If clinically indicated additional testing with an alternate test  methodology [phone]([id number]) is advised. The SARS-CoV-2 RNA is generally  detectable in upper and lower respiratory sp ecimens during the acute  phase of infection. The expected result is Negative. Fact Sheet for Patients:  BoilerBrush.com.cyhttps://www.fda.gov/media/136312/download Fact Sheet for Healthcare Providers: https://pope.com/https://www.fda.gov/media/136313/download This test is not yet approved or cleared by the [place] FDA and has been authorized for detection and/or diagnosis of SARS-CoV-2 by FDA under an Emergency Use Authorization (EUA).  This EUA will remain in effect (meaning this test can be used) for the duration of the COVID-19 declaration under Section 564(b)(1) of the Act, 21 U.S.C. section 360bbb-3(b)(1), unless the authorization is terminated or revoked sooner. Performed at [facility], [address] [place], [place] [address]    Dg Abd 2 Views  Result Date: 09/06/2018 CLINICAL DATA:  Abdominal pain. Black tarry stools for 1 week. Weakness. EXAM: ABDOMEN - 2 VIEW COMPARISON:  CT abdomen and pelvis 09/02/2018 FINDINGS: No intraperitoneal free air is identified. Scattered gas is present in the colon and rectum. No dilated loops of bowel are seen to suggest obstruction. Pacemaker leads, breast implants, and proximal left femoral screws are noted. The lung bases are clear. Thoracolumbar spondylosis and levoscoliosis are noted. IMPRESSION: Nonobstructed bowel gas pattern. Electronically Signed   By: Sebastian AcheAllen  Grady M.D.   On: 09/06/2018 15:29   [place] Abdomen Limited Ruq  Result Date: 09/06/2018 CLINICAL DATA:  Abdominal pain EXAM: ULTRASOUND ABDOMEN LIMITED RIGHT UPPER QUADRANT COMPARISON:  CT abdomen 09/02/2018 FINDINGS: Gallbladder: Multiple gallstones measuring up to 3 mm. No  gallbladder wall thickening.  Negative sonographic Murphy sign. Common bile duct: Diameter: 3.0 Liver: Multiple liver cysts as noted on CT. Calcified granuloma in the left lobe. Portal vein is patent on color Doppler imaging with normal direction of blood flow towards the liver. IMPRESSION: Cholelithiasis without cholecystitis or biliary dilatation Multiple hepatic cysts. Electronically Signed   By: Franchot Gallo M.D.   On: 09/06/2018 14:12    Pending Labs Unresulted Labs (From admission, onward)    Start     Ordered   09/07/18 6712  Basic metabolic panel  Tomorrow morning,   STAT     09/06/18 1321   09/07/18 0500  CBC  Tomorrow morning,   STAT     09/06/18 1321   09/06/18 1900  Hemoglobin  Once,   STAT     09/06/18 1318          Vitals/Pain Today's Vitals   09/06/18 1315 09/06/18 1330 09/06/18 1400 09/06/18 1430  BP: (!) 134/56 139/77 (!) 143/61 (!) 126/50  Pulse: 63 62 65 63  Resp: 18 12 20 16   Temp:      TempSrc:      SpO2: 100% 100% 99% 100%  Weight:      Height:      PainSc:        Isolation Precautions No active isolations  Medications Medications  pantoprazole (PROTONIX) injection 40 mg (40 mg Intravenous Given 09/06/18 1401)  losartan (COZAAR) tablet 50 mg (has no administration in time range)  dronedarone (MULTAQ) tablet 400 mg (has no administration in time range)  acetaminophen (TYLENOL) tablet 650 mg (has no administration in time range)    Or  acetaminophen (TYLENOL) suppository 650 mg (has no administration in time range)  oxyCODONE (Oxy IR/ROXICODONE) immediate release tablet 5 mg (has no administration in time range)  ondansetron (ZOFRAN) tablet 4 mg (has no administration in time range)    Or  ondansetron (ZOFRAN) injection 4 mg (has no administration in time range)  metoprolol succinate (TOPROL-XL) 24 hr tablet 25 mg (has no administration in time range)    And  metoprolol succinate (TOPROL-XL) 24 hr tablet 12.5 mg (has no administration in time range)   multivitamin with minerals tablet 1 tablet (has no administration in time range)  sodium chloride 0.9 % bolus 500 mL (0 mLs Intravenous Stopped 09/06/18 1200)  phytonadione (VITAMIN K) tablet 10 mg (10 mg Oral Given 09/06/18 1401)    Mobility walks with device Low fall risk   Focused Assessments    R Recommendations: See Admitting Provider Note  Report given to:   Additional Notes:

## 2018-09-06 NOTE — ED Triage Notes (Signed)
Pt reports black tarry stools for the past week and was advised to come to the ED for evaluation by her PMD. Pt states she feels weak as well.

## 2018-09-06 NOTE — H&P (Signed)
[facility] - [place] at [facility]   PATIENT NAME: Tina Barron    MR#:  [id number]  DATE OF BIRTH:  10/26/1932  DATE OF ADMISSION:  09/06/2018  PRIMARY CARE PHYSICIAN: Patrice ParadiseMcLaughlin, Miriam K, MD   REQUESTING/REFERRING PHYSICIAN: Dr Jene Everyobert Kinner  CHIEF COMPLAINT:   Chief Complaint  Patient presents with  . Rectal Bleeding  . Weakness  . Abdominal Pain    HISTORY OF PRESENT ILLNESS:  Tina Barron  is a 85 y.o. female with a known history of came in with black stools going on for a week.  Started as diarrhea.  She saw her PMD and they put her on sulfa and Flagyl for possible diverticulitis.  She had 3-4 black episodes of bowel movements yesterday and 1 today.  She is feeling very weak and very poor appetite.  She is lost 8 to 10 pounds.  In the ER she was found to be anemic with a hemoglobin of 9 3 and her INR was up at 6.4 and hospitalist services were contacted for further evaluation.  PAST MEDICAL HISTORY:   Past Medical History:  Diagnosis Date  . Anxiety and depression   . Atherosclerosis   . B12 deficiency   . Degenerative joint disease (DJD) of hip   . Fibromyalgia syndrome   . HH (hiatus hernia)   . Hypertension   . IBS (irritable bowel syndrome)   . Osteopenia   . Paroxysmal A-fib (HCC)   . Sinoatrial node dysfunction (HCC)    S/p pacemaker implant 04/17/08. Atrial lead revision 06/08/08    . Valvular heart disease    S/p tricuspid valve replacement with a 29mm bioprosthesis and mitral valve repair with St Jude SARP26 and epicardial modified MAZE procedure on 10/28/06      PAST SURGICAL HISTORY:   Past Surgical History:  Procedure Laterality Date  . APPENDECTOMY  1952  . CARDIAC VALVE REPLACEMENT    . LUMBAR LAMINECTOMY  1989  . PARTIAL HYSTERECTOMY  1972  . PLACEMENT OF BREAST IMPLANTS  1976  . REPLACEMENT TOTAL HIP W/  RESURFACING IMPLANTS  2015    SOCIAL HISTORY:   Social History   Tobacco Use  . Smoking status: Never Smoker   . Smokeless tobacco: Never Used  Substance Use Topics  . Alcohol use: Not on file    FAMILY HISTORY:   Family History  Problem Relation Age of Onset  . Lung cancer Father   . Lung cancer Brother   . Breast cancer Daughter   . CVA Mother     DRUG ALLERGIES:   Allergies  Allergen Reactions  . Amlodipine Itching  . Diltiazem Hcl Hives  . Duloxetine Other (See Comments)    Hyperactivity   . Other Other (See Comments) and Hives    Uncoded Allergy. Allergen: Shellfish Uncoded Allergy. Allergen: Msg  . Penicillin G Itching  . Shellfish Allergy   . Doxycycline     Blurred vision  . Hydrocortisone Rash  . Prednisone Rash    REVIEW OF SYSTEMS:  CONSTITUTIONAL: No fever, chills or sweats.  Positive for weight loss 8 to 10 pounds.  Positive for fatigue.  EYES: No blurred or double vision.  EARS, NOSE, AND THROAT: No tinnitus or ear pain. No sore throat.  Decreased hearing.  Positive for dysphasia to pills RESPIRATORY: No cough, shortness of breath, wheezing or hemoptysis.  CARDIOVASCULAR: No chest pain, orthopnea, edema.  GASTROINTESTINAL: No nausea, vomiting, diarrhea or abdominal pain. No blood in bowel movements GENITOURINARY: No dysuria,  hematuria.  ENDOCRINE: No polyuria, nocturia,  HEMATOLOGY: No anemia, easy bruising or bleeding SKIN: No rash or lesion. MUSCULOSKELETAL: No joint pain or arthritis.   NEUROLOGIC: No tingling, numbness, weakness.  PSYCHIATRY: Positive for depression.   MEDICATIONS AT HOME:   Prior to Admission medications   Medication Sig Start Date End Date Taking? Authorizing Provider  acetaminophen (TYLENOL) 500 MG tablet Take 500 mg by mouth every 6 (six) hours as needed.   Yes [provider]  losartan (COZAAR) 50 MG tablet Take 50 mg by mouth daily.    Yes [provider]  metoprolol succinate (TOPROL-XL) 25 MG 24 hr tablet Take 12.5-25 mg by mouth 2 (two) times a day. Take 25 mg in the morning and 12.5 mg at bedtime 12/13/15   Yes [provider]  MULTAQ 400 MG tablet Take 400 mg by mouth 2 (two) times daily with a meal.  12/17/15  Yes [provider]  Multiple Vitamins-Minerals (MULTIVITAMIN PO) Take by mouth.   Yes [provider]  warfarin (COUMADIN) 2.5 MG tablet Take 2.5 mg by mouth one time only at 6 PM.  07/09/15  Yes [provider]      VITAL SIGNS:  Blood pressure (!) 134/56, pulse 63, temperature 98.6 F (37 C), temperature source Oral, resp. rate 18, height 5\' 7"  (1.702 m), weight 56.7 kg, SpO2 100 %.  PHYSICAL EXAMINATION:  GENERAL:  85 y.o.-year-old patient lying in the bed with no acute distress.  EYES: Pupils equal, round, reactive to light and accommodation. No scleral icterus. Extraocular muscles intact.  HEENT: Head atraumatic, normocephalic. Oropharynx and nasopharynx clear.  NECK:  Supple, no jugular venous distention. No thyroid enlargement, no tenderness.  LUNGS: Normal breath sounds bilaterally, no wheezing, rales,rhonchi or crepitation. No use of accessory muscles of respiration.  CARDIOVASCULAR: S1, S2 normal. No murmurs, rubs, or gallops.  ABDOMEN: Soft, tender epigastric and right upper quadrant., nondistended. Bowel sounds present. No organomegaly or mass.  EXTREMITIES: No pedal edema, cyanosis, or clubbing.  NEUROLOGIC: Cranial nerves II through XII are intact. Muscle strength 5/5 in all extremities. Sensation intact. Gait not checked.  PSYCHIATRIC: The patient is alert and oriented x 3.  SKIN: No rash, lesion, or ulcer.   LABORATORY PANEL:   CBC Recent Labs  Lab 09/06/18 1055  WBC 5.2  HGB 9.3*  HCT 29.2*  PLT 221   ------------------------------------------------------------------------------------------------------------------  Chemistries  Recent Labs  Lab 09/06/18 1055  NA 139  K 4.2  CL 109  CO2 22  GLUCOSE 93  BUN 29*  CREATININE 1.42*  CALCIUM 9.2  AST 19  ALT 11  ALKPHOS 50  BILITOT 0.7    ------------------------------------------------------------------------------------------------------------------   EKG:   Ordered by me  IMPRESSION AND PLAN:   1.  Upper GI bleed with epigastric abdominal pain.  Started on Protonix.  Serial hemoglobins.  Reverse Coumadin with vitamin K.  GI consultation.  May end up needing a transfusion during the hospital course. 2.  Coagulopathy with high INR.  Reverse Coumadin with vitamin K.  Hold Coumadin. 3.  Atrial fibrillation continue metoprolol and antiarrhythmic.  Hold Coumadin at this time secondary to coagulopathy 4.  Right upper quadrant pain get an ultrasound of the gallbladder PRN nausea medication 5.  Gentle IV fluid 6.  Hypertension on metoprolol 7.  Rapid COVID sent off but still pending    All the records are reviewed and case discussed with ED provider. Management plans discussed with the patient, family and they are in agreement.  CODE STATUS: Full code  TOTAL TIME TAKING CARE OF THIS PATIENT: 45 minutes.    Alford Highlandichard Dominyk Law M.D on 09/06/2018 at 1:22 PM  Between 7am to 6pm - Pager - [phone]  After 6pm call admission pager [phone]  [facility] Office  [phone]  CC: Primary care physician; Patrice ParadiseMcLaughlin, Miriam K, MD

## 2018-09-07 LAB — CBC
HCT: 28.1 % — ABNORMAL LOW (ref 36.0–46.0)
Hemoglobin: 9 g/dL — ABNORMAL LOW (ref 12.0–15.0)
MCH: 31.8 pg (ref 26.0–34.0)
MCHC: 32 g/dL (ref 30.0–36.0)
MCV: 99.3 fL (ref 80.0–100.0)
Platelets: 188 10*3/uL (ref 150–400)
RBC: 2.83 MIL/uL — ABNORMAL LOW (ref 3.87–5.11)
RDW: 14.6 % (ref 11.5–15.5)
WBC: 3.6 10*3/uL — ABNORMAL LOW (ref 4.0–10.5)
nRBC: 0 % (ref 0.0–0.2)

## 2018-09-07 LAB — BASIC METABOLIC PANEL
Anion gap: 6 (ref 5–15)
BUN: 22 mg/dL (ref 8–23)
CO2: 23 mmol/L (ref 22–32)
Calcium: 8.7 mg/dL — ABNORMAL LOW (ref 8.9–10.3)
Chloride: 108 mmol/L (ref 98–111)
Creatinine, Ser: 1.12 mg/dL — ABNORMAL HIGH (ref 0.44–1.00)
GFR calc Af Amer: 52 mL/min — ABNORMAL LOW (ref >60)
GFR calc non Af Amer: 45 mL/min — ABNORMAL LOW (ref >60)
Glucose, Bld: 82 mg/dL (ref 70–99)
Potassium: 4.5 mmol/L (ref 3.5–5.1)
Sodium: 137 mmol/L (ref 135–145)

## 2018-09-07 LAB — PROTIME-INR
INR: 5.2 (ref 0.8–1.2)
Prothrombin Time: 47.2 seconds — ABNORMAL HIGH (ref 11.4–15.2)

## 2018-09-07 MED ORDER — CIPROFLOXACIN IN D5W 200 MG/100ML IV SOLN
200.0000 mg | Freq: Two times a day (BID) | INTRAVENOUS | Status: DC
  Filled 2018-09-07 (×2): qty 100

## 2018-09-07 MED ORDER — SODIUM CHLORIDE 0.9 % IV BOLUS
500.0000 mL | Freq: Once | INTRAVENOUS | Status: AC
  Administered 2018-09-07: 500 mL via INTRAVENOUS

## 2018-09-07 MED ORDER — PHYTONADIONE 5 MG PO TABS
10.0000 mg | ORAL_TABLET | Freq: Once | ORAL | Status: AC
  Administered 2018-09-07: 16:00:00 10 mg via ORAL
  Filled 2018-09-07: qty 2

## 2018-09-07 MED ORDER — CIPROFLOXACIN IN D5W 400 MG/200ML IV SOLN
400.0000 mg | Freq: Two times a day (BID) | INTRAVENOUS | Status: DC
  Administered 2018-09-07 – 2018-09-08 (×2): 400 mg via INTRAVENOUS
  Filled 2018-09-07 (×3): qty 200

## 2018-09-07 MED ORDER — SODIUM CHLORIDE 0.9 % IV SOLN
INTRAVENOUS | Status: DC | PRN
  Administered 2018-09-07: 250 mL via INTRAVENOUS
  Administered 2018-09-09: 10:00:00 via INTRAVENOUS
  Administered 2018-09-10 – 2018-09-15 (×4): 1000 mL via INTRAVENOUS

## 2018-09-07 MED ORDER — METRONIDAZOLE IN NACL 5-0.79 MG/ML-% IV SOLN
500.0000 mg | Freq: Three times a day (TID) | INTRAVENOUS | Status: DC
  Administered 2018-09-07 – 2018-09-08 (×2): 500 mg via INTRAVENOUS
  Filled 2018-09-07 (×4): qty 100

## 2018-09-07 NOTE — Progress Notes (Signed)
Pt INR critical at 5.2. MD notified, orders to hold coumadin if scheduled. Will  continue to monitor for any signs of bleeding.

## 2018-09-07 NOTE — Plan of Care (Signed)
Patient doing well.  Tolerating clear liquid.  Still having loose stools but they are more brown today.  INR is still high,  I gave her another dose of Vitamin K this afternoon.  No significant changes.  I talked to the patient's daughter on the phone and gave her an update.

## 2018-09-07 NOTE — Progress Notes (Addendum)
[facility] - [place] at [facility]   PATIENT NAME: Tina CroftsGolda Flores    MR#:  [id number]  DATE OF BIRTH:  06/11/1932  SUBJECTIVE:   Patient states she is feeling okay this morning.  She did have one episode of melena overnight.  She denies any chest pain, shortness of breath, abdominal pain, nausea, or vomiting.  REVIEW OF SYSTEMS:  Review of Systems  Constitutional: Negative for chills and fever.  HENT: Negative for congestion and sore throat.   Eyes: Negative for blurred vision and double vision.  Respiratory: Negative for cough and shortness of breath.   Cardiovascular: Negative for chest pain and palpitations.  Gastrointestinal: Positive for melena. Negative for abdominal pain, diarrhea, nausea and vomiting.  Genitourinary: Negative for dysuria and urgency.  Musculoskeletal: Negative for back pain and neck pain.  Neurological: Negative for dizziness and headaches.  Psychiatric/Behavioral: Negative for depression. The patient is not nervous/anxious.     DRUG ALLERGIES:   Allergies  Allergen Reactions   Amlodipine Itching   Diltiazem Hcl Hives   Duloxetine Other (See Comments)    Hyperactivity    Other Other (See Comments) and Hives    Uncoded Allergy. Allergen: Shellfish Uncoded Allergy. Allergen: Msg   Penicillin G Itching   Shellfish Allergy    Doxycycline     Blurred vision   Hydrocortisone Rash   Prednisone Rash   VITALS:  Blood pressure 129/74, pulse (!) 127, temperature 98.6 F (37 C), temperature source Oral, resp. rate 16, height 5\' 7"  (1.702 m), weight 58.7 kg, SpO2 99 %. PHYSICAL EXAMINATION:  Physical Exam  GENERAL:  Laying in the bed with no acute distress.  Thin appearing. HEENT: Head atraumatic, normocephalic. Pupils equal, round, reactive to light and accommodation. No scleral icterus. Extraocular muscles intact. Oropharynx and nasopharynx clear.  NECK:  Supple, no jugular venous distention. No thyroid enlargement. LUNGS: Lungs are  clear to auscultation bilaterally. No wheezes, crackles, rhonchi. No use of accessory muscles of respiration.  CARDIOVASCULAR: RRR, S1, S2 normal. No murmurs, rubs, or gallops.  ABDOMEN: Soft, nontender, nondistended. Bowel sounds present.  EXTREMITIES: No pedal edema, cyanosis, or clubbing.  NEUROLOGIC: CN 2-12 intact, no focal deficits. 5/5 muscle strength throughout all extremities. Sensation intact throughout. Gait not checked.  PSYCHIATRIC: The patient is alert and oriented x 3.  SKIN: No obvious rash, lesion, or ulcer.  LABORATORY PANEL:  Female CBC Recent Labs  Lab 09/07/18 0514  WBC 3.6*  HGB 9.0*  HCT 28.1*  PLT 188   ------------------------------------------------------------------------------------------------------------------ Chemistries  Recent Labs  Lab 09/06/18 1055 09/07/18 0514  NA 139 137  K 4.2 4.5  CL 109 108  CO2 22 23  GLUCOSE 93 82  BUN 29* 22  CREATININE 1.42* 1.12*  CALCIUM 9.2 8.7*  AST 19  --   ALT 11  --   ALKPHOS 50  --   BILITOT 0.7  --    RADIOLOGY:  Dg Abd 2 Views  Result Date: 09/06/2018 CLINICAL DATA:  Abdominal pain. Black tarry stools for 1 week. Weakness. EXAM: ABDOMEN - 2 VIEW COMPARISON:  CT abdomen and pelvis 09/02/2018 FINDINGS: No intraperitoneal free air is identified. Scattered gas is present in the colon and rectum. No dilated loops of bowel are seen to suggest obstruction. Pacemaker leads, breast implants, and proximal left femoral screws are noted. The lung bases are clear. Thoracolumbar spondylosis and levoscoliosis are noted. IMPRESSION: Nonobstructed bowel gas pattern. Electronically Signed   By: Sebastian AcheAllen  Grady M.D.   On: 09/06/2018  15:29   ASSESSMENT AND PLAN:   1.  Upper GI bleed with epigastric abdominal pain.    Continue Protonix.  Serial hemoglobins.  s/p Vitamin K x 1.  GI consult pending.    Liquid diet for now. 2. Acute blood loss anemia- due to above. Serial H/H. 3.  Supratherapeutic INR. S/P vitamin K x1. Holding  Coumadin. Recheck INR in the morning. 4.    Paroxysmal atrial fibrillation. Continue metoprolol and dronedarone.  Holding Coumadin. 5.  Right upper quadrant pain. This has improved. RUQ [place] with cholelithiasis but no cholecystitis.  6.  Hypertension on metoprolol 7.  Rapid COVID sent off but still pending  All the records are reviewed and case discussed with Care Management/Social Worker. Management plans discussed with the patient, family and they are in agreement.  CODE STATUS: Full Code  TOTAL TIME TAKING CARE OF THIS PATIENT: 35 minutes.   More than 50% of the time was spent in counseling/coordination of care: YES  POSSIBLE D/C IN 1-2 DAYS, DEPENDING ON CLINICAL CONDITION.   Berna Spare Demaya Hardge M.D on 09/07/2018 at 2:26 PM  Between 7am to 6pm - Pager [phone]  After 6pm go to [email or web address] - Proofreader  [facility] [place] [facility]  Office  [phone]  CC: Primary care physician; Marinda Elk, MD  Note: This dictation was prepared with Dragon dictation along with smaller phrase technology. Any transcriptional errors that result from this process are unintentional.

## 2018-09-07 NOTE — Progress Notes (Signed)
Notified dr.willis of pt v/s and having to give metoprolol po. Acknowledged and new orders given to give and do 500 bolus. Cont to monitor,.

## 2018-09-07 NOTE — Consult Note (Addendum)
GI Inpatient Consult Note  Reason for Consult: GI Bleed   Attending Requesting Consult: Dr. Hyman Barron  History of Present Illness: Tina Barron is a 85 y.o. female seen for evaluation of melena and epigastric pain at the request of Dr. Brett Barron. Pt has a PMH of paroxsymal atrial fibrillation on Warfarin, valvular heart disease s/p tricuspid valve replacement 2008, IBS, HTN, fibromyalgia, anxiety, and depression. Pt presented to the ED yesterday for melena x 10 days. She was seen as an outpatient by her PCP - Tina Barron where she was advised to go to the ED but patient refused. She was found to have fever up to 101.2 and complained of generalized weakness, melena, and abdominal pain. CT scan 06/19 showing moderate diverticulosis of the colon with findings which may be due to mild acute sigmoid diveticulitis w/o perforation or abscess. She was advised to start Bactrim and Flagyl. She reports she took antibiotics for 3 days over the weekend but stopped b/c "I read their side effects online." She reports she had 4 episodes of black, tarry stools on Monday which prompted her to come to the ED. She reports the black stools started out as diarrhea but have progressively turned into black, tarry appearing stools. She had 4 episodes of melena on Monday. She reports one episode last night and none so far today. In the ED, she was found to have hemoglobin 9.3 and INR 6.4. Baseline hemoglobin appears to be in the high 10s. Patient is on chronic anticoagulation with warfarin. Pt has received Vitamin K x2, last dose this afternoon. RUQ US showed cholelithiasis but no evidence of cholecystitis. CBC this morning showed hemoglobin 9.0 and INR 5.2. She reports abd pain mainly in the RUQ that she describes as constant and aching in nature. Pain can radiate across her bilateral upper abdomen. Associated symptoms include increased gas pains, abd bloating, and mild nausea. She denies dysphagia, odynophagia, early satiety, or  unintentional weight loss. She denies any frequent NSAID use. She denies any chest pain, SOB, or dizziness. She follows with Dr. Tasia Barron for IDA with history of iron infusions.    Last Colonoscopy:  Last Endoscopy: >10 years ago    Past Medical History:  Past Medical History:  Diagnosis Date  . Anxiety and depression   . Atherosclerosis   . B12 deficiency   . Degenerative joint disease (DJD) of hip   . Fibromyalgia syndrome   . HH (hiatus hernia)   . Hypertension   . IBS (irritable bowel syndrome)   . Osteopenia   . Paroxysmal A-fib ([place])   . Sinoatrial node dysfunction (HCC)    S/p pacemaker implant 04/17/08. Atrial lead revision 06/08/08    . Valvular heart disease    S/p tricuspid valve replacement with a 45mm bioprosthesis and mitral valve repair with St Jude SARP26 and epicardial modified MAZE procedure on 10/28/06      Problem List: Patient Active Problem List   Diagnosis Date Noted  . GI bleed 09/06/2018  . Iron deficiency anemia 04/01/2017  . Essential hypertension, benign 08/21/2016  . Leg pain 08/21/2016  . Aortic atherosclerosis ([place]) 08/21/2016  . Anemia 01/22/2016  . Atrial fibrillation ([place]) 01/22/2016  . Paroxysmal A-fib ([place]) 11/06/2014    Past Surgical History: Past Surgical History:  Procedure Laterality Date  . APPENDECTOMY  1952  . CARDIAC VALVE REPLACEMENT    . LUMBAR LAMINECTOMY  1989  . PARTIAL HYSTERECTOMY  1972  . PLACEMENT OF BREAST IMPLANTS  1976  . REPLACEMENT TOTAL  HIP W/  RESURFACING IMPLANTS  2015    Allergies: Allergies  Allergen Reactions  . Amlodipine Itching  . Diltiazem Hcl Hives  . Duloxetine Other (See Comments)    Hyperactivity   . Other Other (See Comments) and Hives    Uncoded Allergy. Allergen: Shellfish Uncoded Allergy. Allergen: Msg  . Penicillin G Itching  . Shellfish Allergy   . Doxycycline     Blurred vision  . Hydrocortisone Rash  . Prednisone Rash    Home Medications: Medications Prior to Admission  Medication  Sig Dispense Refill Last Dose  . acetaminophen (TYLENOL) 500 MG tablet Take 500 mg by mouth every 6 (six) hours as needed.   prn at prn  . losartan (COZAAR) 50 MG tablet Take 50 mg by mouth daily.    09/05/2018 at Unknown time  . metoprolol succinate (TOPROL-XL) 25 MG 24 hr tablet Take 12.5-25 mg by mouth 2 (two) times a day. Take 25 mg in the morning and 12.5 mg at bedtime   09/05/2018 at Unknown time  . MULTAQ 400 MG tablet Take 400 mg by mouth 2 (two) times daily with a meal.    09/05/2018 at Unknown time  . Multiple Vitamins-Minerals (MULTIVITAMIN PO) Take by mouth.   Past Week at Unknown time  . warfarin (COUMADIN) 2.5 MG tablet Take 2.5 mg by mouth one time only at 6 PM.    09/05/2018 at Unknown time   Home medication reconciliation was completed with the patient.   Scheduled Inpatient Medications:   . dronedarone  400 mg Oral BID WC  . losartan  50 mg Oral Daily  . metoprolol succinate  25 mg Oral q morning - 10a   And  . metoprolol succinate  12.5 mg Oral QHS  . multivitamin with minerals  1 tablet Oral Daily  . pantoprazole (PROTONIX) IV  40 mg Intravenous Q12H    Continuous Inpatient Infusions:    PRN Inpatient Medications:  acetaminophen **OR** acetaminophen, ondansetron **OR** ondansetron (ZOFRAN) IV, oxyCODONE  Family History: family history includes Breast cancer in her daughter; CVA in her mother; Lung cancer in her brother and father.  The patient's family history is negative for inflammatory bowel disorders, GI malignancy, or solid organ transplantation.  Social History:   reports that she has never smoked. She has never used smokeless tobacco. The patient denies ETOH, tobacco, or drug use.   Review of Systems: Constitutional: Weight is stable.  Eyes: No changes in vision. ENT: No oral lesions, sore throat.  GI: see HPI.  Heme/Lymph: No easy bruising.  CV: No chest pain.  GU: No hematuria.  Integumentary: No rashes.  Neuro: No headaches.  Psych: No  depression/anxiety.  Endocrine: No heat/cold intolerance.  Allergic/Immunologic: No urticaria.  Resp: No cough, SOB.  Musculoskeletal: No joint swelling.    Physical Examination: BP 100/62 (BP Location: Right Arm)   Pulse (!) 110   Temp 98.6 F (37 C) (Oral)   Resp 16   Ht 5\' 7"  (1.702 m)   Wt 58.7 kg   SpO2 100%   BMI 20.28 kg/m  Gen: NAD, elderly female sitting up in bed, answers all questions  HEENT: PEERLA, EOMI, Neck: supple, no JVD or thyromegaly Chest: CTA bilaterally, no wheezes, crackles, or other adventitious sounds CV: RRR, no m/g/c/r Abd: soft, nondistended, mildly tender to light and deep palpation in RUQ and epigastric region, no LLQ tenderness, +BS in all four quadrants; no HSM, guarding, ridigity, or rebound tenderness Ext: no edema, well perfused with 2+  pulses, Skin: no rash or lesions noted Lymph: no LAD  Data: Lab Results  Component Value Date   WBC 3.6 (L) 09/07/2018   HGB 9.0 (L) 09/07/2018   HCT 28.1 (L) 09/07/2018   MCV 99.3 09/07/2018   PLT 188 09/07/2018   Recent Labs  Lab 09/06/18 1055 09/06/18 1859 09/07/18 0514  HGB 9.3* 9.5* 9.0*   Lab Results  Component Value Date   NA 137 09/07/2018   K 4.5 09/07/2018   CL 108 09/07/2018   CO2 23 09/07/2018   BUN 22 09/07/2018   CREATININE 1.12 (H) 09/07/2018   Lab Results  Component Value Date   ALT 11 09/06/2018   AST 19 09/06/2018   ALKPHOS 50 09/06/2018   BILITOT 0.7 09/06/2018   Recent Labs  Lab 09/06/18 1055 09/07/18 0514  APTT 58*  --   INR 6.4* 5.2*   Assessment/Plan:  85 y/o Caucasian female with a PMH of paroxsymal atrial fibrillation on chronic anticoagulation with Warfarin, valvular heart disease s/p tricuspid valve replacement 2008, IBS, HTN, fibromyalgia, anxiety, and depression admitted for melena  1. Melena - consistent with upper GI bleed. Differential includes peptic ulcer disease, gastritis +/- H pylori, duodenitis, esophagitis, Dieulafoy's lesion, Mallory-Weiss  tear, AVMs, or right colon source, possible diverticular  2. Supratherapeutic INR - s/p Vit K x 2 - Continue to hold Warfarin - Recheck INR in the morning - Ideally would want INR to be <2 in order to consider endoscopic procedures  3. RUQ abd pain  4. Abnormal CT scan - possible acute sigmoid diverticulitis    Recommendations:  1. Patient is currently hemodynamically stable. Last episode of melena was apparently last night. 2. Continue to monitor H&H. Transfuse for Hgb <7.0. 3. Recheck INR in the AM. Needs to be <2 to consider endoscopic procedures 4. Full liquid diet. NPO after midnight. 5. Start Cipro and Flagyl for coverage of diverticulitis  6. Advise luminal evaluation when clinically feasible 7. Agree with PPI  Thank you for the consult. Please call with questions or concerns.  Mickle MalloryGranville M Cloud Graham, PA-C [facility] [phone] (Cell)

## 2018-09-08 LAB — BASIC METABOLIC PANEL
Anion gap: 9 (ref 5–15)
BUN: 16 mg/dL (ref 8–23)
CO2: 20 mmol/L — ABNORMAL LOW (ref 22–32)
Calcium: 8.7 mg/dL — ABNORMAL LOW (ref 8.9–10.3)
Chloride: 110 mmol/L (ref 98–111)
Creatinine, Ser: 1.44 mg/dL — ABNORMAL HIGH (ref 0.44–1.00)
GFR calc Af Amer: 39 mL/min — ABNORMAL LOW (ref >60)
GFR calc non Af Amer: 33 mL/min — ABNORMAL LOW (ref >60)
Glucose, Bld: 92 mg/dL (ref 70–99)
Potassium: 4.1 mmol/L (ref 3.5–5.1)
Sodium: 139 mmol/L (ref 135–145)

## 2018-09-08 LAB — CBC
HCT: 28.6 % — ABNORMAL LOW (ref 36.0–46.0)
Hemoglobin: 9.1 g/dL — ABNORMAL LOW (ref 12.0–15.0)
MCH: 31.5 pg (ref 26.0–34.0)
MCHC: 31.8 g/dL (ref 30.0–36.0)
MCV: 99 fL (ref 80.0–100.0)
Platelets: 225 10*3/uL (ref 150–400)
RBC: 2.89 MIL/uL — ABNORMAL LOW (ref 3.87–5.11)
RDW: 14.7 % (ref 11.5–15.5)
WBC: 4.2 10*3/uL (ref 4.0–10.5)
nRBC: 0 % (ref 0.0–0.2)

## 2018-09-08 LAB — PROTIME-INR
INR: 2.7 — ABNORMAL HIGH (ref 0.8–1.2)
Prothrombin Time: 28.1 seconds — ABNORMAL HIGH (ref 11.4–15.2)

## 2018-09-08 MED ORDER — PIPERACILLIN-TAZOBACTAM 3.375 G IVPB
3.3750 g | Freq: Three times a day (TID) | INTRAVENOUS | Status: DC
  Administered 2018-09-08 – 2018-09-14 (×17): 3.375 g via INTRAVENOUS
  Filled 2018-09-08 (×17): qty 50

## 2018-09-08 MED ORDER — CIPROFLOXACIN IN D5W 400 MG/200ML IV SOLN
400.0000 mg | INTRAVENOUS | Status: DC
  Filled 2018-09-08: qty 200

## 2018-09-08 MED ORDER — DIPHENHYDRAMINE HCL 50 MG/ML IJ SOLN
25.0000 mg | Freq: Four times a day (QID) | INTRAMUSCULAR | Status: DC | PRN
  Administered 2018-09-08: 13:00:00 25 mg via INTRAVENOUS
  Filled 2018-09-08: qty 1

## 2018-09-08 MED ORDER — DIPHENHYDRAMINE HCL 25 MG PO CAPS: 25.0000 mg | ORAL_CAPSULE | Freq: Four times a day (QID) | ORAL | Status: DC | PRN

## 2018-09-08 MED ORDER — SODIUM CHLORIDE 0.9 % IV SOLN
INTRAVENOUS | Status: DC
  Administered 2018-09-08 – 2018-09-09 (×2): via INTRAVENOUS

## 2018-09-08 NOTE — Progress Notes (Signed)
Pt concerned about taking flagyl and it being the cause of her INR to not be right. Notified dr.diamond and he verified with pharmacy and said ok to take. Pt very concerned and did not want to cont taking it. She received 1st bag and half of 2nd bag.

## 2018-09-08 NOTE — H&P (View-Only) (Signed)
GI Inpatient Follow-up Note  Subjective:  Patient seen in f/u for progressive melena. No acute events overnight. Pt was started on full liquid diet last night. She reports she has not had a BM overnight or this morning. She does endorse more pain in LLQ today she describes as sharp in nature, currently 7/10. She denies any nausea, vomiting. She does endorse some intermittent esophaegal dysphagia symptoms to some solids food where she feels like solid food gets stuck at level of sternal notch. No food regurgitation. She was started on heart healthy diet and excited to eat some mashed potatoes for lunch. She denies any recurrent overt bleeding. INR this morning was 2.7.   Scheduled Inpatient Medications:  . dronedarone  400 mg Oral BID WC  . metoprolol succinate  25 mg Oral q morning - 10a   And  . metoprolol succinate  12.5 mg Oral QHS  . multivitamin with minerals  1 tablet Oral Daily  . pantoprazole (PROTONIX) IV  40 mg Intravenous Q12H    Continuous Inpatient Infusions:   . sodium chloride Stopped (09/08/18 0328)  . sodium chloride 50 mL/hr at 09/08/18 1244  . piperacillin-tazobactam (ZOSYN)  IV 3.375 g (09/08/18 1317)    PRN Inpatient Medications:  sodium chloride, acetaminophen **OR** acetaminophen, diphenhydrAMINE **OR** diphenhydrAMINE, ondansetron **OR** ondansetron (ZOFRAN) IV, oxyCODONE  Review of Systems: Constitutional: Weight is stable.  Eyes: No changes in vision. ENT: No oral lesions, sore throat.  GI: see HPI.  Heme/Lymph: No easy bruising.  CV: No chest pain.  GU: No hematuria.  Integumentary: No rashes.  Neuro: No headaches.  Psych: No depression/anxiety.  Endocrine: No heat/cold intolerance.  Allergic/Immunologic: No urticaria.  Resp: No cough, SOB.  Musculoskeletal: No joint swelling.    Physical Examination: BP (!) 103/58 (BP Location: Right Arm)   Pulse 73   Temp 98 F (36.7 C) (Oral)   Resp 18   Ht 5\' 7"  (1.702 m)   Wt 58.7 kg   SpO2 100%   BMI  20.28 kg/m  Gen: NAD, alert and oriented x 4 HEENT: PEERLA, EOMI, Neck: supple, no JVD or thyromegaly Chest: CTA bilaterally, no wheezes, crackles, or other adventitious sounds CV: RRR, no m/g/c/r Abd: soft, tenderness to palpation in RUQ and LLQ, ND, +BS in all four quadrants; no HSM, guarding, ridigity, or rebound tenderness Ext: no edema, well perfused with 2+ pulses, Skin: no rash or lesions noted Lymph: no LAD  Data: Lab Results  Component Value Date   WBC 4.2 09/08/2018   HGB 9.1 (L) 09/08/2018   HCT 28.6 (L) 09/08/2018   MCV 99.0 09/08/2018   PLT 225 09/08/2018   Recent Labs  Lab 09/06/18 1859 09/07/18 0514 09/08/18 0416  HGB 9.5* 9.0* 9.1*   Lab Results  Component Value Date   NA 139 09/08/2018   K 4.1 09/08/2018   CL 110 09/08/2018   CO2 20 (L) 09/08/2018   BUN 16 09/08/2018   CREATININE 1.44 (H) 09/08/2018   Lab Results  Component Value Date   ALT 11 09/06/2018   AST 19 09/06/2018   ALKPHOS 50 09/06/2018   BILITOT 0.7 09/06/2018   Recent Labs  Lab 09/06/18 1055  09/08/18 0416  APTT 58*  --   --   INR 6.4*   < > 2.7*   < > = values in this interval not displayed.   Assessment/Plan:  85 y/o female with hx of PAF on warfarin, Tricuspid valve replacement presents for several days of melena   1.  Acute blood loss anemia - likely upper GI bleed 2. Supratherapeutic INR - improving s/o Vit K x 2. Currently 2.7. 3. Diverticulitis - continue antibiotics 4. PAF - continue to hold warfarin  - Continue acid suppression - Recheck INR in the morning, goal for INR <2 - Okay to continue heart healthy diet today. NPO after midnight - She is hemodynamically stable with stable hgb at 9.0 - no evidence of overt GI bleeding at this time - Plan for EGD tomorrow with Dr. Alice Reichert - Further recommendations after procedure  I reviewed the risks (including bleeding, perforation, infection, anesthesia complications, cardiac/respiratory complications), benefits and  alternatives of EGD. Patient consents to proceed.    Please call with questions or concerns.    Octavia Bruckner, PA-C [facility] [phone] (Cell)

## 2018-09-08 NOTE — Progress Notes (Signed)
Patient ID: Tina Barron, female   DOB: Nov 22, 1932, 85 y.o.   MRN: [id number]  [employer] PROGRESS NOTE  KAHLIYAH DICK HKV:[id number] DOB: 11/22/1932 DOA: 09/06/2018 PCP: Marinda Elk, MD  HPI/Subjective: Patient still having some abdominal discomfort but it has moved from the upper abdomen down to the lower abdomen.  Still not feeling well.  Concerned about the antibiotics that the gastroenterologist started.  Objective: Vitals:   09/08/18 0018 09/08/18 0409  BP: (!) 90/50 (!) 103/58  Pulse: 68 73  Resp:  18  Temp:  98 F (36.7 C)  SpO2: 98% 100%    Intake/Output Summary (Last 24 hours) at 09/08/2018 1427 Last data filed at 09/08/2018 0459 Gross per 24 hour  Intake 577.33 ml  Output -  Net 577.33 ml   Filed Weights   09/06/18 1035 09/06/18 2030  Weight: 56.7 kg 58.7 kg    ROS: Review of Systems  Constitutional: Negative for chills and fever.  Eyes: Negative for blurred vision.  Respiratory: Negative for cough and shortness of breath.   Cardiovascular: Negative for chest pain.  Gastrointestinal: Positive for abdominal pain. Negative for constipation, diarrhea, nausea and vomiting.  Genitourinary: Negative for dysuria.  Musculoskeletal: Negative for joint pain.  Neurological: Negative for dizziness and headaches.   Exam: Physical Exam  Constitutional: She is oriented to person, place, and time.  HENT:  Nose: No mucosal edema.  Mouth/Throat: No oropharyngeal exudate or posterior oropharyngeal edema.  Eyes: Pupils are equal, round, and reactive to light. Conjunctivae, EOM and lids are normal.  Neck: No JVD present. Carotid bruit is not present. No edema present. No thyroid mass and no thyromegaly present.  Cardiovascular: S1 normal and S2 normal. Exam reveals no gallop.  No murmur heard. Pulses:      Dorsalis pedis pulses are 2+ on the right side and 2+ on the left side.  Respiratory: No respiratory distress. She has no wheezes. She has no rhonchi. She  has no rales.  GI: Soft. Bowel sounds are normal. There is abdominal tenderness.  Musculoskeletal:     Right ankle: She exhibits no swelling.     Left ankle: She exhibits no swelling.  Lymphadenopathy:    She has no cervical adenopathy.  Neurological: She is alert and oriented to person, place, and time. No cranial nerve deficit.  Skin: Skin is warm. No rash noted. Nails show no clubbing.  Psychiatric: She has a normal mood and affect.      Data Reviewed: Basic Metabolic Panel: Recent Labs  Lab 09/02/18 1313 09/06/18 1055 09/07/18 0514 09/08/18 0416  NA  --  139 137 139  K  --  4.2 4.5 4.1  CL  --  109 108 110  CO2  --  22 23 20*  GLUCOSE  --  93 82 92  BUN  --  29* 22 16  CREATININE 1.10* 1.42* 1.12* 1.44*  CALCIUM  --  9.2 8.7* 8.7*   Liver Function Tests: Recent Labs  Lab 09/06/18 1055  AST 19  ALT 11  ALKPHOS 50  BILITOT 0.7  PROT 7.2  ALBUMIN 3.8   CBC: Recent Labs  Lab 09/06/18 1055 09/06/18 1859 09/07/18 0514 09/08/18 0416  WBC 5.2  --  3.6* 4.2  NEUTROABS 3.9  --   --   --   HGB 9.3* 9.5* 9.0* 9.1*  HCT 29.2*  --  28.1* 28.6*  MCV 100.7*  --  99.3 99.0  PLT 221  --  188 225  Recent Results (from the past 240 hour(s))  SARS Coronavirus 2 (CEPHEID - Performed in [facility]), Hosp Order     Status: None   Collection Time: 09/06/18 12:32 PM   Specimen: Nasopharyngeal Swab  Result Value Ref Range Status   SARS Coronavirus 2 NEGATIVE NEGATIVE Final    Comment: (NOTE) If result is NEGATIVE SARS-CoV-2 target nucleic acids are NOT DETECTED. The SARS-CoV-2 RNA is generally detectable in upper and lower  respiratory specimens during the acute phase of infection. The lowest  concentration of SARS-CoV-2 viral copies this assay can detect is 250  copies / mL. A negative result does not preclude SARS-CoV-2 infection  and should not be used as the sole basis for treatment or other  patient management decisions.  A negative result may  occur with  improper specimen collection / handling, submission of specimen other  than nasopharyngeal swab, presence of viral mutation(s) within the  areas targeted by this assay, and inadequate number of viral copies  (<250 copies / mL). A negative result must be combined with clinical  observations, patient history, and epidemiological information. If result is POSITIVE SARS-CoV-2 target nucleic acids are DETECTED. The SARS-CoV-2 RNA is generally detectable in upper and lower  respiratory specimens dur ing the acute phase of infection.  Positive  results are indicative of active infection with SARS-CoV-2.  Clinical  correlation with patient history and other diagnostic information is  necessary to determine patient infection status.  Positive results do  not rule out bacterial infection or co-infection with other viruses. If result is PRESUMPTIVE POSTIVE SARS-CoV-2 nucleic acids MAY BE PRESENT.   A presumptive positive result was obtained on the submitted specimen  and confirmed on repeat testing.  While 2019 novel coronavirus  (SARS-CoV-2) nucleic acids may be present in the submitted sample  additional confirmatory testing may be necessary for epidemiological  and / or clinical management purposes  to differentiate between  SARS-CoV-2 and other Sarbecovirus currently known to infect humans.  If clinically indicated additional testing with an alternate test  methodology [phone]([id number]) is advised. The SARS-CoV-2 RNA is generally  detectable in upper and lower respiratory sp ecimens during the acute  phase of infection. The expected result is Negative. Fact Sheet for Patients:  BoilerBrush.com.cyhttps://www.fda.gov/media/136312/download Fact Sheet for Healthcare Providers: https://pope.com/https://www.fda.gov/media/136313/download This test is not yet approved or cleared by the [place] FDA and has been authorized for detection and/or diagnosis of SARS-CoV-2 by FDA under an Emergency Use Authorization (EUA).  This  EUA will remain in effect (meaning this test can be used) for the duration of the COVID-19 declaration under Section 564(b)(1) of the Act, 21 U.S.C. section 360bbb-3(b)(1), unless the authorization is terminated or revoked sooner. Performed at [facility], [address] [place], [place] [address]      Studies: Dg Abd 2 Views  Result Date: 09/06/2018 CLINICAL DATA:  Abdominal pain. Black tarry stools for 1 week. Weakness. EXAM: ABDOMEN - 2 VIEW COMPARISON:  CT abdomen and pelvis 09/02/2018 FINDINGS: No intraperitoneal free air is identified. Scattered gas is present in the colon and rectum. No dilated loops of bowel are seen to suggest obstruction. Pacemaker leads, breast implants, and proximal left femoral screws are noted. The lung bases are clear. Thoracolumbar spondylosis and levoscoliosis are noted. IMPRESSION: Nonobstructed bowel gas pattern. Electronically Signed   By: Sebastian AcheAllen  Grady M.D.   On: 09/06/2018 15:29    Scheduled Meds: . dronedarone  400 mg Oral BID WC  . metoprolol succinate  25 mg Oral q morning - 10a   And  . metoprolol succinate  12.5 mg Oral QHS  . multivitamin with minerals  1 tablet Oral Daily  . pantoprazole (PROTONIX) IV  40 mg Intravenous Q12H   Continuous Infusions: . sodium chloride Stopped (09/08/18 0328)  . sodium chloride 50 mL/hr at 09/08/18 1244  . piperacillin-tazobactam (ZOSYN)  IV 3.375 g (09/08/18 1317)    Assessment/Plan:  1. Upper GI bleed with epigastric abdominal pain.  Patient received 1 dose of vitamin K upon presentation.  Patient on Protonix.  Hemoglobin has drifted but no need for transfusion at this point.  Awaiting INR to come down prior to endoscopy. 2. Coagulopathy with high INR.  Vitamin K given on presentation.  INR coming down. 3. Atrial fibrillation on metoprolol and antiarrhythmic.  Holding Coumadin at this time secondary to coagulopathy and GI bleed.  Risk of stroke higher off anticoagulation. 4. Suspected  diverticulitis.  Switch antibiotics to Zosyn.  Cipro can cause arrhythmia with her antiarrhythmic.  Patient does not tolerate Flagyl well. 5. Acute kidney injury.  Restart IV fluids.  Hold losartan.  Code Status:     Code Status Orders  (From admission, onward)         Start     Ordered   09/06/18 1321  Full code  Continuous     09/06/18 1321        Code Status History    This patient has a current code status but no historical code status.   Advance Care Planning Activity     Family Communication: Left message for daughter Disposition Plan: To be determined  Consultants:  Gastroenterology  Antibiotics:  Zosyn  Time spent: 28 minutes  Ivis Nicolson [employer]  [employer]

## 2018-09-08 NOTE — Progress Notes (Signed)
GI Inpatient Follow-up Note  Subjective:  Patient seen in f/u for progressive melena. No acute events overnight. Pt was started on full liquid diet last night. She reports she has not had a BM overnight or this morning. She does endorse more pain in LLQ today she describes as sharp in nature, currently 7/10. She denies any nausea, vomiting. She does endorse some intermittent esophaegal dysphagia symptoms to some solids food where she feels like solid food gets stuck at level of sternal notch. No food regurgitation. She was started on heart healthy diet and excited to eat some mashed potatoes for lunch. She denies any recurrent overt bleeding. INR this morning was 2.7.   Scheduled Inpatient Medications:  . dronedarone  400 mg Oral BID WC  . metoprolol succinate  25 mg Oral q morning - 10a   And  . metoprolol succinate  12.5 mg Oral QHS  . multivitamin with minerals  1 tablet Oral Daily  . pantoprazole (PROTONIX) IV  40 mg Intravenous Q12H    Continuous Inpatient Infusions:   . sodium chloride Stopped (09/08/18 0328)  . sodium chloride 50 mL/hr at 09/08/18 1244  . piperacillin-tazobactam (ZOSYN)  IV 3.375 g (09/08/18 1317)    PRN Inpatient Medications:  sodium chloride, acetaminophen **OR** acetaminophen, diphenhydrAMINE **OR** diphenhydrAMINE, ondansetron **OR** ondansetron (ZOFRAN) IV, oxyCODONE  Review of Systems: Constitutional: Weight is stable.  Eyes: No changes in vision. ENT: No oral lesions, sore throat.  GI: see HPI.  Heme/Lymph: No easy bruising.  CV: No chest pain.  GU: No hematuria.  Integumentary: No rashes.  Neuro: No headaches.  Psych: No depression/anxiety.  Endocrine: No heat/cold intolerance.  Allergic/Immunologic: No urticaria.  Resp: No cough, SOB.  Musculoskeletal: No joint swelling.    Physical Examination: BP (!) 103/58 (BP Location: Right Arm)   Pulse 73   Temp 98 F (36.7 C) (Oral)   Resp 18   Ht 5' 7" (1.702 m)   Wt 58.7 kg   SpO2 100%   BMI  20.28 kg/m  Gen: NAD, alert and oriented x 4 HEENT: PEERLA, EOMI, Neck: supple, no JVD or thyromegaly Chest: CTA bilaterally, no wheezes, crackles, or other adventitious sounds CV: RRR, no m/g/c/r Abd: soft, tenderness to palpation in RUQ and LLQ, ND, +BS in all four quadrants; no HSM, guarding, ridigity, or rebound tenderness Ext: no edema, well perfused with 2+ pulses, Skin: no rash or lesions noted Lymph: no LAD  Data: Lab Results  Component Value Date   WBC 4.2 09/08/2018   HGB 9.1 (L) 09/08/2018   HCT 28.6 (L) 09/08/2018   MCV 99.0 09/08/2018   PLT 225 09/08/2018   Recent Labs  Lab 09/06/18 1859 09/07/18 0514 09/08/18 0416  HGB 9.5* 9.0* 9.1*   Lab Results  Component Value Date   NA 139 09/08/2018   K 4.1 09/08/2018   CL 110 09/08/2018   CO2 20 (L) 09/08/2018   BUN 16 09/08/2018   CREATININE 1.44 (H) 09/08/2018   Lab Results  Component Value Date   ALT 11 09/06/2018   AST 19 09/06/2018   ALKPHOS 50 09/06/2018   BILITOT 0.7 09/06/2018   Recent Labs  Lab 09/06/18 1055  09/08/18 0416  APTT 58*  --   --   INR 6.4*   < > 2.7*   < > = values in this interval not displayed.   Assessment/Plan:  85 y/o female with hx of PAF on warfarin, Tricuspid valve replacement presents for several days of melena   1.   Acute blood loss anemia - likely upper GI bleed 2. Supratherapeutic INR - improving s/o Vit K x 2. Currently 2.7. 3. Diverticulitis - continue antibiotics 4. PAF - continue to hold warfarin  - Continue acid suppression - Recheck INR in the morning, goal for INR <2 - Okay to continue heart healthy diet today. NPO after midnight - She is hemodynamically stable with stable hgb at 9.0 - no evidence of overt GI bleeding at this time - Plan for EGD tomorrow with Dr. Alice Reichert - Further recommendations after procedure  I reviewed the risks (including bleeding, perforation, infection, anesthesia complications, cardiac/respiratory complications), benefits and  alternatives of EGD. Patient consents to proceed.    Please call with questions or concerns.    Octavia Bruckner, PA-C [facility] [phone] (Cell)

## 2018-09-08 NOTE — Consult Note (Signed)
Pharmacy Antibiotic Note  Tina Barron is a 85 y.o. female admitted on 09/06/2018 with intra-abdominal infection.  Pharmacy has been consulted for Zosyn dosing.  Plan: Zosyn 3.375g IV q8h (4 hour infusion).  Pt has reported itching with PCN - per Dr Leslye Peer ok to continue - added order for Benadryl prn itching  Height: 5\' 7"  (170.2 cm) Weight: 129 lb 8 oz (58.7 kg) IBW/kg (Calculated) : 61.6  Temp (24hrs), Avg:98 F (36.7 C), Min:97.9 F (36.6 C), Max:98 F (36.7 C)  Recent Labs  Lab 09/02/18 1313 09/06/18 1055 09/07/18 0514 09/08/18 0416  WBC  --  5.2 3.6* 4.2  CREATININE 1.10* 1.42* 1.12* 1.44*    Estimated Creatinine Clearance: 26.9 mL/min (A) (by C-G formula based on SCr of 1.44 mg/dL (H)).    Allergies  Allergen Reactions  . Amlodipine Itching  . Diltiazem Hcl Hives  . Duloxetine Other (See Comments)    Hyperactivity   . Other Other (See Comments) and Hives    Uncoded Allergy. Allergen: Shellfish Uncoded Allergy. Allergen: Msg  . Penicillin G Itching  . Shellfish Allergy   . Doxycycline     Blurred vision  . Hydrocortisone Rash  . Prednisone Rash    Antimicrobials this admission: 6/24 Cipro >> 6/25 6/24 Flagyl >> 6/25 6/25 Zosyn >>  Dose adjustments this admission: None  Microbiology results: COVID-NEG  Thank you for allowing pharmacy to be a part of this patient's care.  Lu Duffel, PharmD, BCPS Clinical Pharmacist 09/08/2018 8:58 AM

## 2018-09-09 ENCOUNTER — Encounter: Payer: Self-pay | Admitting: Internal Medicine

## 2018-09-09 ENCOUNTER — Inpatient Hospital Stay: Payer: Medicare Other | Admitting: Anesthesiology

## 2018-09-09 ENCOUNTER — Encounter
Admission: EM | Disposition: A | Discharge status: Home / Self Care | Payer: Self-pay | Source: Home / Self Care | Attending: Internal Medicine

## 2018-09-09 HISTORY — PX: ESOPHAGOGASTRODUODENOSCOPY: SHX5428

## 2018-09-09 LAB — PROTIME-INR
INR: 1.8 — ABNORMAL HIGH (ref 0.8–1.2)
Prothrombin Time: 20.7 seconds — ABNORMAL HIGH (ref 11.4–15.2)

## 2018-09-09 LAB — CREATININE, SERUM
Creatinine, Ser: 1.35 mg/dL — ABNORMAL HIGH (ref 0.44–1.00)
GFR calc Af Amer: 42 mL/min — ABNORMAL LOW (ref >60)
GFR calc non Af Amer: 36 mL/min — ABNORMAL LOW (ref >60)

## 2018-09-09 LAB — HEMOGLOBIN: Hemoglobin: 8.3 g/dL — ABNORMAL LOW (ref 12.0–15.0)

## 2018-09-09 SURGERY — EGD (ESOPHAGOGASTRODUODENOSCOPY)
Anesthesia: General

## 2018-09-09 MED ORDER — POLYETHYLENE GLYCOL 3350 17 GM/SCOOP PO POWD
1.0000 | Freq: Once | ORAL | Status: AC
  Administered 2018-09-09: 255 g via ORAL
  Filled 2018-09-09: qty 255

## 2018-09-09 MED ORDER — LIDOCAINE HCL (PF) 2 % IJ SOLN
INTRAMUSCULAR | Status: DC | PRN
  Administered 2018-09-09: 60 mg

## 2018-09-09 MED ORDER — SODIUM CHLORIDE 0.9 % IV SOLN: INTRAVENOUS | Status: DC

## 2018-09-09 MED ORDER — NYSTATIN 100000 UNIT/ML MT SUSP
5.0000 mL | Freq: Four times a day (QID) | OROMUCOSAL | Status: DC
  Administered 2018-09-09 – 2018-09-16 (×25): 500000 [IU] via ORAL
  Filled 2018-09-09 (×25): qty 5

## 2018-09-09 MED ORDER — PROPOFOL 10 MG/ML IV BOLUS
INTRAVENOUS | Status: DC | PRN
  Administered 2018-09-09 (×3): 10 mg via INTRAVENOUS
  Administered 2018-09-09: 20 mg via INTRAVENOUS

## 2018-09-09 MED ORDER — METOPROLOL SUCCINATE ER 25 MG PO TB24
12.5000 mg | ORAL_TABLET | Freq: Every day | ORAL | Status: DC
  Administered 2018-09-09 – 2018-09-13 (×5): 12.5 mg via ORAL
  Filled 2018-09-09 (×5): qty 1

## 2018-09-09 MED ORDER — PROPOFOL 500 MG/50ML IV EMUL
INTRAVENOUS | Status: DC | PRN
  Administered 2018-09-09: 25 ug/kg/min via INTRAVENOUS

## 2018-09-09 MED ORDER — METOPROLOL SUCCINATE ER 25 MG PO TB24
25.0000 mg | ORAL_TABLET | Freq: Every day | ORAL | Status: DC
  Administered 2018-09-10 – 2018-09-13 (×2): 25 mg via ORAL
  Filled 2018-09-09 (×3): qty 1

## 2018-09-09 NOTE — Op Note (Signed)
[facility]enter[facility] Patient Name: Tina CroftsGolda Barron Procedure Date: 09/09/2018 10:25 AM MRN: [id number] Account #: [id number] Date of Birth: 06/02/1932 Admit Type: Outpatient Age: 85 [facility] ROOM 4 Gender: Female Note Status: Finalized Procedure:            Upper GI endoscopy Indications:          Acute post hemorrhagic anemia, Melena Providers:            Boykin Nearingeodoro K. Norma Fredricksonoledo MD, MD Referring MD:         Marilynne HalstedMiriam J. Mclaughlin, MD (Referring MD) Medicines:            Propofol per Anesthesia Complications:        No immediate complications. Procedure:            Pre-Anesthesia Assessment:                       - The risks and benefits of the procedure and the                        sedation options and risks were discussed with the                        patient. All questions were answered and informed                        consent was obtained.                       - Patient identification and proposed procedure were                        verified prior to the procedure by the nurse. The                        procedure was verified in the procedure room.                       - ASA Grade Assessment: III - A patient with severe                        systemic disease.                       - Prior Aspirin/ NSAID therapy: The patient has taken                        no previous aspirin or NSAID medications.                       After obtaining informed consent, the endoscope was                        passed under direct vision. Throughout the procedure,                        the patient's blood pressure, pulse, and oxygen                        saturations were monitored continuously. The Endoscope  was introduced through the mouth, and advanced to the                        third part of duodenum. The upper GI endoscopy was                        accomplished without difficulty. The patient tolerated                        the  procedure well. Findings:      The examined esophagus was normal.      A 2 cm hiatal hernia was present.      The examined duodenum was normal. Impression:           - Normal esophagus.                       - 2 cm hiatal hernia.                       - Normal examined duodenum.                       - No specimens collected. Recommendation:       - Return patient to hospital ward for ongoing care.                       - Perform a colonoscopy tomorrow.                       - The findings and recommendations were discussed with                        the patient. Procedure Code(s):    --- Professional ---                       [phone], Esophagogastroduodenoscopy, flexible, transoral;                        diagnostic, including collection of specimen(s) by                        brushing or washing, when performed (separate procedure) Diagnosis Code(s):    --- Professional ---                       K92.1, Melena (includes Hematochezia)                       D62, Acute posthemorrhagic anemia                       K44.9, Diaphragmatic hernia without obstruction or                        gangrene CPT copyright 2019 American Medical Association. All rights reserved. The codes documented in this report are preliminary and upon coder review may  be revised to meet current compliance requirements. Efrain Sella MD, MD 09/09/2018 11:07:54 AM This report has been signed electronically. Number of Addenda: 0 Note Initiated On: 09/09/2018 10:25 AM Estimated Blood Loss: Estimated blood loss: none.      [facility]

## 2018-09-09 NOTE — Interval H&P Note (Signed)
History and Physical Interval Note:  09/09/2018 10:53 AM  Tina Barron  has presented today for surgery, with the diagnosis of melena, abdominal pain, anemia.  The various methods of treatment have been discussed with the patient and family. After consideration of risks, benefits and other options for treatment, the patient has consented to  Procedure(s): ESOPHAGOGASTRODUODENOSCOPY (EGD) (N/A) as a surgical intervention.  The patient's history has been reviewed, patient examined, no change in status, stable for surgery.  I have reviewed the patient's chart and labs.  Questions were answered to the patient's satisfaction.     [place], [place]

## 2018-09-09 NOTE — Consult Note (Signed)
Pharmacy Antibiotic Note  Tina Barron is a 85 y.o. female admitted on 09/06/2018 with intra-abdominal infection.  Pharmacy has been consulted for Zosyn dosing.  Plan: Continue Zosyn 3.375g IV q8h (4 hour infusion).  Pt has reported itching with PCN - per Dr Leslye Peer ok to continue - added order for Benadryl prn itching  Height: 5\' 7"  (170.2 cm) Weight: 129 lb 8 oz (58.7 kg) IBW/kg (Calculated) : 61.6  Temp (24hrs), Avg:98.3 F (36.8 C), Min:98.2 F (36.8 C), Max:98.4 F (36.9 C)  Recent Labs  Lab 09/02/18 1313 09/06/18 1055 09/07/18 0514 09/08/18 0416 09/09/18 0442  WBC  --  5.2 3.6* 4.2  --   CREATININE 1.10* 1.42* 1.12* 1.44* 1.35*    Estimated Creatinine Clearance: 28.7 mL/min (A) (by C-G formula based on SCr of 1.35 mg/dL (H)).    Allergies  Allergen Reactions  . Amlodipine Itching  . Diltiazem Hcl Hives  . Duloxetine Other (See Comments)    Hyperactivity   . Other Other (See Comments) and Hives    Uncoded Allergy. Allergen: Shellfish Uncoded Allergy. Allergen: Msg  . Penicillin G Itching  . Shellfish Allergy   . Doxycycline     Blurred vision  . Hydrocortisone Rash  . Prednisone Rash    Antimicrobials this admission: 6/24 Cipro >> 6/25 6/24 Flagyl >> 6/25 (Switched antibiotics to Zosyn despite itching reaction in past.  Cipro can cause arrhythmia with her antiarrhythmic.  Patient does not tolerate Flagyl)  6/25 Zosyn >>  Dose adjustments this admission: None  Microbiology results: COVID-NEG  Thank you for allowing pharmacy to be a part of this patient's care.  Lu Duffel, PharmD, BCPS Clinical Pharmacist 09/09/2018 7:43 AM

## 2018-09-09 NOTE — Anesthesia Preprocedure Evaluation (Addendum)
Anesthesia Evaluation  Patient identified by MRN, date of birth, ID band Patient awake    Reviewed: Allergy & Precautions, H&P , NPO status , Patient's Chart, lab work & pertinent test results  History of Anesthesia Complications (+) history of anesthetic complications (reports hoarse voice since prolonged intubation for cardiac surgery)  Airway Mallampati: II  TM Distance: >3 FB     Dental  (+) Teeth Intact, Caps   Pulmonary neg pulmonary ROS,           Cardiovascular hypertension, (-) Past MI + dysrhythmias Atrial Fibrillation + pacemaker + Valvular Problems/Murmurs   S/p tricuspid valve replacement and mitral valve repair S/p pacemaker for SA node dysfunction   Neuro/Psych PSYCHIATRIC DISORDERS Anxiety Depression negative neurological ROS     GI/Hepatic Neg liver ROS, hiatal hernia,   Endo/Other  negative endocrine ROS  Renal/GU negative Renal ROS  negative genitourinary   Musculoskeletal   Abdominal   Peds  Hematology  (+) Blood dyscrasia, anemia ,   Anesthesia Other Findings Past Medical History: No date: Anxiety and depression No date: Atherosclerosis No date: B12 deficiency No date: Degenerative joint disease (DJD) of hip No date: Fibromyalgia syndrome No date: HH (hiatus hernia) No date: Hypertension No date: IBS (irritable bowel syndrome) No date: Osteopenia No date: Paroxysmal A-fib (HCC) No date: Sinoatrial node dysfunction (HCC)     Comment:  S/p pacemaker implant 04/17/08. Atrial lead revision               06/08/08   No date: Valvular heart disease     Comment:  S/p tricuspid valve replacement with a 33mm               bioprosthesis and mitral valve repair with St Jude [id number]              and epicardial modified MAZE procedure on 10/28/06    Past Surgical History: 1952: APPENDECTOMY No date: CARDIAC VALVE REPLACEMENT 1989: LUMBAR LAMINECTOMY 1972: PARTIAL HYSTERECTOMY 1976: PLACEMENT OF  BREAST IMPLANTS 2015: REPLACEMENT TOTAL HIP W/  RESURFACING IMPLANTS  BMI    Body Mass Index: 20.28 kg/m      Reproductive/Obstetrics negative OB ROS                            Anesthesia Physical Anesthesia Plan  ASA: II  Anesthesia Plan: General   Post-op Pain Management:    Induction:   PONV Risk Score and Plan: Propofol infusion and TIVA  Airway Management Planned: Natural Airway and Nasal Cannula  Additional Equipment:   Intra-op Plan:   Post-operative Plan:   Informed Consent: I have reviewed the patients History and Physical, chart, labs and discussed the procedure including the risks, benefits and alternatives for the proposed anesthesia with the patient or authorized representative who has indicated his/her understanding and acceptance.     Dental Advisory Given  Plan Discussed with: Anesthesiologist, CRNA and Surgeon  Anesthesia Plan Comments:         Anesthesia Quick Evaluation

## 2018-09-09 NOTE — Care Management Important Message (Signed)
Important Message  Patient Details  Name: Tina Barron MRN: [id number] Date of Birth: [birth year]   Medicare Important Message Given:  Yes     [name] 09/09/2018, 10:19 AM

## 2018-09-09 NOTE — Progress Notes (Signed)
Patient ID: Tina Barron, female   DOB: 08-05-1932, 85 y.o.   MRN: [id number]  [employer] [facility] PROGRESS NOTE  Tina Barron:[id number] DOB: September 16, 1932 DOA: 09/06/2018 PCP: Marinda Elk, MD  HPI/Subjective: Patient still having some abdominal pain.  States abdominal pain is a little bit better than when she came in.  Objective: Vitals:   09/09/18 1130 09/09/18 1140  BP: 115/76 122/77  Pulse: (!) 106 (!) 106  Resp: 18 (!) 28  Temp:    SpO2: 100% 100%    Intake/Output Summary (Last 24 hours) at 09/09/2018 1444 Last data filed at 09/09/2018 1258 Gross per 24 hour  Intake 1433.08 ml  Output 250 ml  Net 1183.08 ml   Filed Weights   09/06/18 1035 09/06/18 2030  Weight: 56.7 kg 58.7 kg    ROS: Review of Systems  Constitutional: Negative for chills and fever.  Eyes: Negative for blurred vision.  Respiratory: Negative for cough and shortness of breath.   Cardiovascular: Negative for chest pain.  Gastrointestinal: Positive for abdominal pain. Negative for constipation, diarrhea, nausea and vomiting.  Genitourinary: Negative for dysuria.  Musculoskeletal: Negative for joint pain.  Neurological: Negative for dizziness and headaches.   Exam: Physical Exam  Constitutional: She is oriented to person, place, and time.  HENT:  Nose: No mucosal edema.  Mouth/Throat: No oropharyngeal exudate or posterior oropharyngeal edema.  Eyes: Pupils are equal, round, and reactive to light. Conjunctivae, EOM and lids are normal.  Neck: No JVD present. Carotid bruit is not present. No edema present. No thyroid mass and no thyromegaly present.  Cardiovascular: S1 normal and S2 normal. Exam reveals no gallop.  No murmur heard. Pulses:      Dorsalis pedis pulses are 2+ on the right side and 2+ on the left side.  Respiratory: No respiratory distress. She has no wheezes. She has no rhonchi. She has no rales.  GI: Soft. Bowel sounds are normal. There is abdominal tenderness.   Musculoskeletal:     Right ankle: She exhibits no swelling.     Left ankle: She exhibits no swelling.  Lymphadenopathy:    She has no cervical adenopathy.  Neurological: She is alert and oriented to person, place, and time. No cranial nerve deficit.  Skin: Skin is warm. No rash noted. Nails show no clubbing.  Psychiatric: She has a normal mood and affect.      Data Reviewed: Basic Metabolic Panel: Recent Labs  Lab 09/06/18 1055 09/07/18 0514 09/08/18 0416 09/09/18 0442  NA 139 137 139  --   K 4.2 4.5 4.1  --   CL 109 108 110  --   CO2 22 23 20*  --   GLUCOSE 93 82 92  --   BUN 29* 22 16  --   CREATININE 1.42* 1.12* 1.44* 1.35*  CALCIUM 9.2 8.7* 8.7*  --    Liver Function Tests: Recent Labs  Lab 09/06/18 1055  AST 19  ALT 11  ALKPHOS 50  BILITOT 0.7  PROT 7.2  ALBUMIN 3.8   CBC: Recent Labs  Lab 09/06/18 1055 09/06/18 1859 09/07/18 0514 09/08/18 0416 09/09/18 0442  WBC 5.2  --  3.6* 4.2  --   NEUTROABS 3.9  --   --   --   --   HGB 9.3* 9.5* 9.0* 9.1* 8.3*  HCT 29.2*  --  28.1* 28.6*  --   MCV 100.7*  --  99.3 99.0  --   PLT 221  --  188 225  --  Recent Results (from the past 240 hour(s))  SARS Coronavirus 2 (CEPHEID - Performed [facility]), Hosp Order     Status: None   Collection Time: 09/06/18 12:32 PM   Specimen: Nasopharyngeal Swab  Result Value Ref Range Status   SARS Coronavirus 2 NEGATIVE NEGATIVE Final    Comment: (NOTE) If result is NEGATIVE SARS-CoV-2 target nucleic acids are NOT DETECTED. The SARS-CoV-2 RNA is generally detectable in upper and lower  respiratory specimens during the acute phase of infection. The lowest  concentration of SARS-CoV-2 viral copies this assay can detect is 250  copies / mL. A negative result does not preclude SARS-CoV-2 infection  and should not be used as the sole basis for treatment or other  patient management decisions.  A negative result may occur with  improper specimen collection  / handling, submission of specimen other  than nasopharyngeal swab, presence of viral mutation(s) within the  areas targeted by this assay, and inadequate number of viral copies  (<250 copies / mL). A negative result must be combined with clinical  observations, patient history, and epidemiological information. If result is POSITIVE SARS-CoV-2 target nucleic acids are DETECTED. The SARS-CoV-2 RNA is generally detectable in upper and lower  respiratory specimens dur ing the acute phase of infection.  Positive  results are indicative of active infection with SARS-CoV-2.  Clinical  correlation with patient history and other diagnostic information is  necessary to determine patient infection status.  Positive results do  not rule out bacterial infection or co-infection with other viruses. If result is PRESUMPTIVE POSTIVE SARS-CoV-2 nucleic acids MAY BE PRESENT.   A presumptive positive result was obtained on the submitted specimen  and confirmed on repeat testing.  While 2019 novel coronavirus  (SARS-CoV-2) nucleic acids may be present in the submitted sample  additional confirmatory testing may be necessary for epidemiological  and / or clinical management purposes  to differentiate between  SARS-CoV-2 and other Sarbecovirus currently known to infect humans.  If clinically indicated additional testing with an alternate test  methodology [phone] is advised. The SARS-CoV-2 RNA is generally  detectable in upper and lower respiratory sp ecimens during the acute  phase of infection. The expected result is Negative. Fact Sheet for Patients:  BoilerBrush.com.cyhttps://www.fda.gov/media/136312/download Fact Sheet for Healthcare Providers: https://pope.com/https://www.fda.gov/media/136313/download This test is not yet approved or cleared by the [place] FDA and has been authorized for detection and/or diagnosis of SARS-CoV-2 by FDA under an Emergency Use Authorization (EUA).  This EUA will remain in effect (meaning this  test can be used) for the duration of the COVID-19 declaration under Section 564(b)(1) of the Act, 21 U.S.C. section 360bbb-3(b)(1), unless the authorization is terminated or revoked sooner. Performed at [facility], [address] [place], [place] [address]      Studies: No results found.  Scheduled Meds: . dronedarone  400 mg Oral BID WC  . metoprolol succinate  12.5 mg Oral QHS  . [START ON 09/10/2018] metoprolol succinate  25 mg Oral Daily  . multivitamin with minerals  1 tablet Oral Daily  . nystatin  5 mL Oral QID  . pantoprazole (PROTONIX) IV  40 mg Intravenous Q12H  . polyethylene glycol powder  1 Container Oral Once   Continuous Infusions: . sodium chloride 0 mL/hr at 09/08/18 0620  . sodium chloride Stopped (09/09/18 1022)  . sodium chloride    . sodium chloride    . piperacillin-tazobactam (ZOSYN)  IV Stopped (09/09/18 [id number])    Assessment/Plan:  1. Upper GI bleed with epigastric abdominal pain.  Patient received 1 dose of vitamin K upon presentation.  Patient on Protonix.  Endoscopy done and reported as negative.  As per gastroenterology will need colonoscopy tomorrow.  Hemoglobin has come down to 8.3.  Continue to watch for need for transfusion. 2. Coagulopathy with high INR.  Vitamin K given on presentation.  INR down to 1.8.  Continue to hold Coumadin. 3. Atrial fibrillation on metoprolol and antiarrhythmic.  Holding Coumadin at this time secondary to coagulopathy and GI bleed.  Risk of stroke higher off anticoagulation. 4. Suspected diverticulitis.  Switched antibiotics to Zosyn.  Cipro can cause arrhythmia with her antiarrhythmic.  Patient does not tolerate Flagyl well. 5. Acute kidney injury.  Restart IV fluids.  Hold losartan.  Code Status:     Code Status Orders  (From admission, onward)         Start     Ordered   09/06/18 1321  Full code  Continuous     09/06/18 1321        Code Status History    This patient has a current code  status but no historical code status.   Advance Care Planning Activity     Family Communication: Tried to call the patient's daughter on the phone but she was not home. Disposition Plan: To be determined  Consultants:  Gastroenterology  Antibiotics:  Zosyn  Time spent: 26 minutes  [employer]  [employer]

## 2018-09-09 NOTE — Anesthesia Post-op Follow-up Note (Signed)
Anesthesia QCDR form completed.        

## 2018-09-09 NOTE — Anesthesia Postprocedure Evaluation (Signed)
Anesthesia Post Note  Patient: Tina Barron  Procedure(s) Performed: ESOPHAGOGASTRODUODENOSCOPY (EGD) (N/A )  Patient location during evaluation: PACU Anesthesia Type: General Level of consciousness: awake and alert Pain management: pain level controlled Vital Signs Assessment: post-procedure vital signs reviewed and stable Respiratory status: spontaneous breathing, nonlabored ventilation and respiratory function stable Cardiovascular status: blood pressure returned to baseline and stable Postop Assessment: no apparent nausea or vomiting Anesthetic complications: no     Last Vitals:  Vitals:   09/09/18 1130 09/09/18 1140  BP: 115/76 122/77  Pulse: (!) 106 (!) 106  Resp: 18 (!) 28  Temp:    SpO2: 100% 100%    Last Pain:  Vitals:   09/09/18 1110  TempSrc:   PainSc: 0-No pain                 Durenda Hurt

## 2018-09-09 NOTE — Transfer of Care (Signed)
Immediate Anesthesia Transfer of Care Note  Patient: Tina Barron  Procedure(s) Performed: ESOPHAGOGASTRODUODENOSCOPY (EGD) (N/A )  Patient Location: PACU  Anesthesia Type:General  Level of Consciousness: sedated  Airway & Oxygen Therapy: Patient Spontanous Breathing  Post-op Assessment: Report given to RN and Post -op Vital signs reviewed and stable  Post vital signs: Reviewed  Last Vitals:  Vitals Value Taken Time  BP 101/45 09/09/18 1109  Temp    Pulse 81 09/09/18 1110  Resp 18 09/09/18 1110  SpO2 100 % 09/09/18 1110  Vitals shown include unvalidated device data.  Last Pain:  Vitals:   09/09/18 1029  TempSrc: Tympanic  PainSc:          Complications: No apparent anesthesia complications

## 2018-09-09 NOTE — Evaluation (Signed)
Physical Therapy Evaluation Patient Details Name: MARQUISHA NIKOLOV MRN: [id number] DOB: August 30, 1932 Today's Date: 09/09/2018   History of Present Illness  85 y/o female here with GI bleed and abdominal pain.  Clinical Impression  Pt was able to do all mobility and transfers w/o assist and showed good confidence and safety with ~100 ft of ambulation in the room with and w/o SPC.  She had no LOBs, vitals remained very consistent and appropriate and despite pt stating she is weaker than her normal she did not show any issues that would require further PT work up or concerns with safety to return home.  No further PT needs, will complete orders.    Follow Up Recommendations No PT follow up    Equipment Recommendations  None recommended by PT    Recommendations for Other Services       Precautions / Restrictions Precautions Precautions: None Restrictions Weight Bearing Restrictions: No      Mobility  Bed Mobility Overal bed mobility: Independent             General bed mobility comments: Pt easily transitions to/from supine   Transfers Overall transfer level: Independent Equipment used: Straight cane             General transfer comment: Pt able to rise to standing w/o cuing or assist, showed good safety and confidence  Ambulation/Gait Ambulation/Gait assistance: Modified independent (Device/Increase time) Gait Distance (Feet): 100 Feet Assistive device: Straight cane       General Gait Details: Pt is able to ambulate with good confidence and as she did not wish to go into the hallway was able to display proficiency with direction and speed changes with and w/o AD w/o LOBs or other safety issues.    Stairs            Wheelchair Mobility    Modified Rankin (Stroke Patients Only)       Balance Overall balance assessment: Independent                                           Pertinent Vitals/Pain Pain Assessment: (sustained abdominal  pain that she states she's now "used to")    Home Living Family/patient expects to be discharged to:: Private residence Living Arrangements: Alone Available Help at Discharge: Family(niece lives 10 minutes away, is "on call")   Home Access: Level entry     Home Layout: One level [place] - single point;Walker - 2 wheels      Prior Function Level of Independence: Independent         Comments: Pt reports she does not need AD in the home, occasionally uses cane while in community.  Drives, runs errands, does yard work, no falls.     Hand Dominance        Extremity/Trunk Assessment   Upper Extremity Assessment Upper Extremity Assessment: Overall WFL for tasks assessed    Lower Extremity Assessment Lower Extremity Assessment: Overall WFL for tasks assessed       Communication   Communication: No difficulties  Cognition Arousal/Alertness: Awake/alert Behavior During Therapy: WFL for tasks assessed/performed Overall Cognitive Status: Within Functional Limits for tasks assessed  General Comments      Exercises     Assessment/Plan    PT Assessment Patent does not need any further PT services  PT Problem List         PT Treatment Interventions      PT Goals (Current goals can be found in the Care Plan section)  Acute Rehab PT Goals Patient Stated Goal: Eat something, go home PT Goal Formulation: All assessment and education complete, DC therapy    Frequency     Barriers to discharge        Co-evaluation               AM-PAC PT "6 Clicks" Mobility  Outcome Measure Help needed turning from your back to your side while in a flat bed without using bedrails?: None Help needed moving from lying on your back to sitting on the side of a flat bed without using bedrails?: None Help needed moving to and from a bed to a chair (including a wheelchair)?: None Help needed standing up from a  chair using your arms (e.g., wheelchair or bedside chair)?: None Help needed to walk in hospital room?: None Help needed climbing 3-5 steps with a railing? : None 6 Click Score: 24    End of Session Equipment Utilized During Treatment: Gait belt Activity Tolerance: Patient tolerated treatment well Patient left: in bed   PT Visit Diagnosis: Muscle weakness (generalized) (M62.81);Difficulty in walking, not elsewhere classified (R26.2)    Time: [id number] PT Time Calculation (min) (ACUTE ONLY): 23 min   Charges:   PT Evaluation $PT Eval Low Complexity: 1 Low          Malachi ProGalen R Blair Mesina, DPT 09/09/2018, 10:19 AM

## 2018-09-10 ENCOUNTER — Encounter
Admission: EM | Disposition: A | Discharge status: Home / Self Care | Payer: Self-pay | Source: Home / Self Care | Attending: Internal Medicine

## 2018-09-10 ENCOUNTER — Inpatient Hospital Stay: Payer: Medicare Other | Admitting: Anesthesiology

## 2018-09-10 DIAGNOSIS — K922 Gastrointestinal hemorrhage, unspecified: Secondary | ICD-10-CM

## 2018-09-10 HISTORY — PX: COLONOSCOPY: SHX5424

## 2018-09-10 LAB — CBC
HCT: 26.6 % — ABNORMAL LOW (ref 36.0–46.0)
Hemoglobin: 8.5 g/dL — ABNORMAL LOW (ref 12.0–15.0)
MCH: 32.2 pg (ref 26.0–34.0)
MCHC: 32 g/dL (ref 30.0–36.0)
MCV: 100.8 fL — ABNORMAL HIGH (ref 80.0–100.0)
Platelets: 175 10*3/uL (ref 150–400)
RBC: 2.64 MIL/uL — ABNORMAL LOW (ref 3.87–5.11)
RDW: 14.9 % (ref 11.5–15.5)
WBC: 3.1 10*3/uL — ABNORMAL LOW (ref 4.0–10.5)
nRBC: 0 % (ref 0.0–0.2)

## 2018-09-10 LAB — PROTIME-INR
INR: 1.8 — ABNORMAL HIGH (ref 0.8–1.2)
Prothrombin Time: 20.8 seconds — ABNORMAL HIGH (ref 11.4–15.2)

## 2018-09-10 LAB — TYPE AND SCREEN
ABO/RH(D): O POS
Antibody Screen: NEGATIVE

## 2018-09-10 LAB — HEMOGLOBIN: Hemoglobin: 8 g/dL — ABNORMAL LOW (ref 12.0–15.0)

## 2018-09-10 LAB — CREATININE, SERUM
Creatinine, Ser: 1.19 mg/dL — ABNORMAL HIGH (ref 0.44–1.00)
GFR calc Af Amer: 49 mL/min — ABNORMAL LOW (ref >60)
GFR calc non Af Amer: 42 mL/min — ABNORMAL LOW (ref >60)

## 2018-09-10 SURGERY — COLONOSCOPY
Anesthesia: General

## 2018-09-10 MED ORDER — LIDOCAINE HCL (CARDIAC) PF 100 MG/5ML IV SOSY
PREFILLED_SYRINGE | INTRAVENOUS | Status: DC | PRN
  Administered 2018-09-10: 80 mg via INTRAVENOUS

## 2018-09-10 MED ORDER — PROPOFOL 500 MG/50ML IV EMUL
INTRAVENOUS | Status: DC | PRN
  Administered 2018-09-10: 200 ug/kg/min via INTRAVENOUS

## 2018-09-10 MED ORDER — PROPOFOL 500 MG/50ML IV EMUL
INTRAVENOUS | Status: AC
  Filled 2018-09-10: qty 50

## 2018-09-10 MED ORDER — PHENYLEPHRINE HCL (PRESSORS) 10 MG/ML IV SOLN
INTRAVENOUS | Status: DC | PRN
  Administered 2018-09-10 (×2): 100 ug via INTRAVENOUS

## 2018-09-10 MED ORDER — WARFARIN SODIUM 2.5 MG PO TABS
2.5000 mg | ORAL_TABLET | Freq: Once | ORAL | Status: AC
  Administered 2018-09-10: 2.5 mg via ORAL
  Filled 2018-09-10: qty 1

## 2018-09-10 MED ORDER — WARFARIN - PHARMACIST DOSING INPATIENT
Freq: Every day | Status: DC
  Administered 2018-09-10: 18:00:00

## 2018-09-10 MED ORDER — SODIUM CHLORIDE 0.9 % IV SOLN
INTRAVENOUS | Status: DC
  Administered 2018-09-10: 10:00:00 via INTRAVENOUS

## 2018-09-10 MED ORDER — PROPOFOL 10 MG/ML IV BOLUS
INTRAVENOUS | Status: DC | PRN
  Administered 2018-09-10: 30 mg via INTRAVENOUS

## 2018-09-10 MED ORDER — LIDOCAINE HCL (PF) 2 % IJ SOLN
INTRAMUSCULAR | Status: AC
  Filled 2018-09-10: qty 10

## 2018-09-10 NOTE — Anesthesia Preprocedure Evaluation (Addendum)
Anesthesia Evaluation  Patient identified by MRN, date of birth, ID band Patient awake    Reviewed: Allergy & Precautions, H&P , NPO status , Patient's Chart, lab work & pertinent test results  History of Anesthesia Complications (+) history of anesthetic complications (reports hoarse voice since prolonged intubation for cardiac surgery)  Airway Mallampati: II  TM Distance: >3 FB     Dental  (+) Teeth Intact, Caps   Pulmonary neg pulmonary ROS,           Cardiovascular hypertension, (-) Past MI + dysrhythmias Atrial Fibrillation + pacemaker + Valvular Problems/Murmurs   S/p tricuspid valve replacement and mitral valve repair S/p pacemaker for SA node dysfunction   Neuro/Psych PSYCHIATRIC DISORDERS Anxiety Depression negative neurological ROS     GI/Hepatic Neg liver ROS, hiatal hernia,   Endo/Other  neg diabetes  Renal/GU negative Renal ROS  negative genitourinary   Musculoskeletal   Abdominal   Peds  Hematology  (+) Blood dyscrasia, anemia ,   Anesthesia Other Findings   Reproductive/Obstetrics negative OB ROS                             Anesthesia Physical  Anesthesia Plan  ASA: III and emergent  Anesthesia Plan: General   Post-op Pain Management:    Induction:   PONV Risk Score and Plan: Propofol infusion and TIVA  Airway Management Planned: Natural Airway and Nasal Cannula  Additional Equipment:   Intra-op Plan:   Post-operative Plan:   Informed Consent: I have reviewed the patients History and Physical, chart, labs and discussed the procedure including the risks, benefits and alternatives for the proposed anesthesia with the patient or authorized representative who has indicated his/her understanding and acceptance.       Plan Discussed with:   Anesthesia Plan Comments:         Anesthesia Quick Evaluation

## 2018-09-10 NOTE — Transfer of Care (Signed)
Immediate Anesthesia Transfer of Care Note  Patient: Tina Barron  Procedure(s) Performed: COLONOSCOPY (N/A )  Patient Location: Endoscopy Unit  Anesthesia Type:MAC  Level of Consciousness: sedated  Airway & Oxygen Therapy: Patient Spontanous Breathing and Patient connected to face mask oxygen  Post-op Assessment: Report given to RN and Post -op Vital signs reviewed and stable  Post vital signs: Reviewed and stable  Last Vitals:  Vitals Value Taken Time  BP 111/35 09/10/18 1120  Temp    Pulse 55 09/10/18 1120  Resp 14 09/10/18 1120  SpO2 100 % 09/10/18 1120  Vitals shown include unvalidated device data.  Last Pain:  Vitals:   09/10/18 1120  TempSrc: (P) Tympanic  PainSc:          Complications: No apparent anesthesia complications

## 2018-09-10 NOTE — Consult Note (Addendum)
[place] for Warfarin  Indication: atrial fibrillation  Allergies  Allergen Reactions  . Amlodipine Itching  . Diltiazem Hcl Hives  . Duloxetine Other (See Comments)    Hyperactivity   . Other Other (See Comments) and Hives    Uncoded Allergy. Allergen: Shellfish Uncoded Allergy. Allergen: Msg  . Penicillin G Itching  . Shellfish Allergy   . Doxycycline     Blurred vision  . Hydrocortisone Rash  . Prednisone Rash    Patient Measurements: Height: 5\' 7"  (170.2 cm) Weight: 129 lb 8 oz (58.7 kg) IBW/kg (Calculated) : 61.6   Vital Signs: Temp: 97.9 F (36.6 C) (06/27 1213) Temp Source: Oral (06/27 1213) BP: 133/64 (06/27 1213) Pulse Rate: 56 (06/27 1213)  Labs: Recent Labs    09/08/18 0416 09/09/18 0442 09/10/18 0445  HGB 9.1* 8.3* 8.0*  HCT 28.6*  --   --   PLT 225  --   --   LABPROT 28.1* 20.7* 20.8*  INR 2.7* 1.8* 1.8*  CREATININE 1.44* 1.35* 1.19*    Estimated Creatinine Clearance: 32.6 mL/min (A) (by C-G formula based on SCr of 1.19 mg/dL (H)).   Medications:  -PTA warfarin 2.5 mg QD @ 1800     Vitamin K PO 10 mg x 2 doses  -No anticoagulants for this admission -Dronedarone (PTA med)  Hospital events: 6/26- EGD-  negative 6/27-  Colonoscopy- revealed diverticulosis    Assessment: Patient was admitted for GI bleed (melena) and anemia. Warfarin has been held since admitted. On admission, her INR was elevated. Of note, patient has been NPO for ~ 2 days for an EGD and colonoscopy - which could elevate the INR. Presently no active bleeding and INR is subtherapeutic at this time. Scr variable and Hgb has been stable. Will resume home warfarin dose today, but may need to decrease dose of warfarin during this admission given patient was admitted with INR being supratherapeutic on 2.5 mg.  Discussed with attending and it was decided to restart warfarin this evening.    Drug interactions:  -Dronedarone - may increase  INR/risk of bleeding  Date  INR  Warfarin (mg)  6/23 6.4 Held (vitamin K)  6/24 5.2  Held (vitamin K)  6/25 2.7 Held  6/26 1.8 Held   6/27 1.8             Goal of Therapy:  INR 2-3 Monitor platelets by anticoagulation protocol: Yes   Plan:  Will obtain baseline CBC and order INR for tomorrow.  Will resume home warfarin dose of 2.5 mg @ 1800 today.   Rowland Lathe 09/10/2018,3:01 PM

## 2018-09-10 NOTE — Op Note (Signed)
[facility] Gastroenterology Patient Name: Tina Barron Procedure Date: 09/10/2018 10:04 AM MRN: [id number] Account #: [id number] Date of Birth: [birth year] Admit Type: Inpatient Age: 85 Room: [facility] ENDO ROOM 4 Gender: Female Note Status: Finalized Procedure:            Colonoscopy Indications:          Gastrointestinal bleeding Providers:            Jonathon Bellows MD, MD Referring MD:         Precious Bard, MD (Referring MD) Medicines:            Monitored Anesthesia Care Complications:        No immediate complications. Procedure:            Pre-Anesthesia Assessment:                       - Prior to the procedure, a History and Physical was                        performed, and patient medications, allergies and                        sensitivities were reviewed. The patient's tolerance of                        previous anesthesia was reviewed.                       - The risks and benefits of the procedure and the                        sedation options and risks were discussed with the                        patient. All questions were answered and informed                        consent was obtained.                       - ASA Grade Assessment: III - A patient with severe                        systemic disease.                       After obtaining informed consent, the colonoscope was                        passed under direct vision. Throughout the procedure,                        the patient's blood pressure, pulse, and oxygen                        saturations were monitored continuously. The                        Colonoscope was introduced through the anus and                        advanced  to the the cecum, identified by the                        appendiceal orifice, IC valve and transillumination.                        The colonoscopy was performed with ease. The patient                        tolerated the procedure well. The quality of the  bowel                        preparation was good. Findings:      The perianal and digital rectal examinations were normal.      Multiple small-mouthed diverticula were found in the sigmoid colon.      The exam was otherwise without abnormality on direct and retroflexion       views.      yellow stool seen , no blood or old blood seen . Impression:           - Diverticulosis in the sigmoid colon.                       - The examination was otherwise normal on direct and                        retroflexion views.                       - No specimens collected. Recommendation:       - Discharge patient to home (with escort).                       - Resume previous diet.                       - Continue present medications.                       - F/u with Dr Norma Fredricksonoledo in a week and consider outpatient                        capsule study of the small bowel. Procedure Code(s):    --- Professional ---                       [phone], Colonoscopy, flexible; diagnostic, including                        collection of specimen(s) by brushing or washing, when                        performed (separate procedure) Diagnosis Code(s):    --- Professional ---                       K92.2, Gastrointestinal hemorrhage, unspecified                       K57.30, Diverticulosis of large intestine without                        perforation or abscess without  bleeding CPT copyright 2019 American Medical Association. All rights reserved. The codes documented in this report are preliminary and upon coder review may  be revised to meet current compliance requirements. Wyline MoodKiran Damyra Luscher, MD Wyline MoodKiran Yuma Blucher MD, MD 09/10/2018 11:20:10 AM This report has been signed electronically. Number of Addenda: 0 Note Initiated On: 09/10/2018 10:04 AM Scope Withdrawal Time: 0 hours 5 minutes 23 seconds  Total Procedure Duration: 0 hours 12 minutes 31 seconds  Estimated Blood Loss: Estimated blood loss: none.      [facility]

## 2018-09-10 NOTE — Progress Notes (Signed)
Patient ID: Tina Barron, female   DOB: 06-Jul-1932, 85 y.o.   MRN: [id number]  [employer] PROGRESS NOTE  Tina Barron QPY:[id number] DOB: 18-Feb-1933 DOA: 09/06/2018 PCP: Marinda Elk, MD  HPI/Subjective: Patient still having some abdominal pain.  For colonoscopy today Coumadin on hold, INR at 1.8 Objective: Vitals:   09/10/18 1150 09/10/18 1213  BP: (!) 119/53 133/64  Pulse: (!) 56 (!) 56  Resp: (!) 21 18  Temp:  97.9 F (36.6 C)  SpO2: 100% 100%    Intake/Output Summary (Last 24 hours) at 09/10/2018 1452 Last data filed at 09/10/2018 0754 Gross per 24 hour  Intake 529.03 ml  Output -  Net 529.03 ml   Filed Weights   09/06/18 1035 09/06/18 2030 09/10/18 1010  Weight: 56.7 kg 58.7 kg 58.7 kg    ROS: Review of Systems  Constitutional: Negative for chills and fever.  Eyes: Negative for blurred vision.  Respiratory: Negative for cough and shortness of breath.   Cardiovascular: Negative for chest pain.  Gastrointestinal: Positive for abdominal pain. Negative for constipation, diarrhea, nausea and vomiting.  Genitourinary: Negative for dysuria.  Musculoskeletal: Negative for joint pain.  Neurological: Negative for dizziness and headaches.   Exam: Physical Exam  Constitutional: She is oriented to person, place, and time.  HENT:  Nose: No mucosal edema.  Mouth/Throat: No oropharyngeal exudate or posterior oropharyngeal edema.  Eyes: Pupils are equal, round, and reactive to light. Conjunctivae, EOM and lids are normal.  Neck: No JVD present. Carotid bruit is not present. No edema present. No thyroid mass and no thyromegaly present.  Cardiovascular: S1 normal and S2 normal. Exam reveals no gallop.  No murmur heard. Pulses:      Dorsalis pedis pulses are 2+ on the right side and 2+ on the left side.  Respiratory: No respiratory distress. She has no wheezes. She has no rhonchi. She has no rales.  GI: Soft. Bowel sounds are normal. There is abdominal tenderness.   Musculoskeletal:     Right ankle: She exhibits no swelling.     Left ankle: She exhibits no swelling.  Lymphadenopathy:    She has no cervical adenopathy.  Neurological: She is alert and oriented to person, place, and time. No cranial nerve deficit.  Skin: Skin is warm. No rash noted. Nails show no clubbing.  Psychiatric: She has a normal mood and affect.      Data Reviewed: Basic Metabolic Panel: Recent Labs  Lab 09/06/18 1055 09/07/18 0514 09/08/18 0416 09/09/18 0442 09/10/18 0445  NA 139 137 139  --   --   K 4.2 4.5 4.1  --   --   CL 109 108 110  --   --   CO2 22 23 20*  --   --   GLUCOSE 93 82 92  --   --   BUN 29* 22 16  --   --   CREATININE 1.42* 1.12* 1.44* 1.35* 1.19*  CALCIUM 9.2 8.7* 8.7*  --   --    Liver Function Tests: Recent Labs  Lab 09/06/18 1055  AST 19  ALT 11  ALKPHOS 50  BILITOT 0.7  PROT 7.2  ALBUMIN 3.8   CBC: Recent Labs  Lab 09/06/18 1055 09/06/18 1859 09/07/18 0514 09/08/18 0416 09/09/18 0442 09/10/18 0445  WBC 5.2  --  3.6* 4.2  --   --   NEUTROABS 3.9  --   --   --   --   --   HGB 9.3* 9.5*  9.0* 9.1* 8.3* 8.0*  HCT 29.2*  --  28.1* 28.6*  --   --   MCV 100.7*  --  99.3 99.0  --   --   PLT 221  --  188 225  --   --     Recent Results (from the past 240 hour(s))  SARS Coronavirus 2 (CEPHEID - Performed in [facility]), Hosp Order     Status: None   Collection Time: 09/06/18 12:32 PM   Specimen: Nasopharyngeal Swab  Result Value Ref Range Status   SARS Coronavirus 2 NEGATIVE NEGATIVE Final    Comment: (NOTE) If result is NEGATIVE SARS-CoV-2 target nucleic acids are NOT DETECTED. The SARS-CoV-2 RNA is generally detectable in upper and lower  respiratory specimens during the acute phase of infection. The lowest  concentration of SARS-CoV-2 viral copies this assay can detect is 250  copies / mL. A negative result does not preclude SARS-CoV-2 infection  and should not be used as the sole basis for treatment or  other  patient management decisions.  A negative result may occur with  improper specimen collection / handling, submission of specimen other  than nasopharyngeal swab, presence of viral mutation(s) within the  areas targeted by this assay, and inadequate number of viral copies  (<250 copies / mL). A negative result must be combined with clinical  observations, patient history, and epidemiological information. If result is POSITIVE SARS-CoV-2 target nucleic acids are DETECTED. The SARS-CoV-2 RNA is generally detectable in upper and lower  respiratory specimens dur ing the acute phase of infection.  Positive  results are indicative of active infection with SARS-CoV-2.  Clinical  correlation with patient history and other diagnostic information is  necessary to determine patient infection status.  Positive results do  not rule out bacterial infection or co-infection with other viruses. If result is PRESUMPTIVE POSTIVE SARS-CoV-2 nucleic acids MAY BE PRESENT.   A presumptive positive result was obtained on the submitted specimen  and confirmed on repeat testing.  While 2019 novel coronavirus  (SARS-CoV-2) nucleic acids may be present in the submitted sample  additional confirmatory testing may be necessary for epidemiological  and / or clinical management purposes  to differentiate between  SARS-CoV-2 and other Sarbecovirus currently known to infect humans.  If clinically indicated additional testing with an alternate test  methodology [phone]) is advised. The SARS-CoV-2 RNA is generally  detectable in upper and lower respiratory sp ecimens during the acute  phase of infection. The expected result is Negative. Fact Sheet for Patients:  BoilerBrush.com.cyhttps://www.fda.gov/media/136312/download Fact Sheet for Healthcare Providers: https://pope.com/https://www.fda.gov/media/136313/download This test is not yet approved or cleared by the [place] FDA and has been authorized for detection and/or diagnosis of  SARS-CoV-2 by FDA under an Emergency Use Authorization (EUA).  This EUA will remain in effect (meaning this test can be used) for the duration of the COVID-19 declaration under Section 564(b)(1) of the Act, 21 U.S.C. section 360bbb-3(b)(1), unless the authorization is terminated or revoked sooner. Performed at [facility], [address] [place], [place] [address]      Studies: No results found.  Scheduled Meds: . dronedarone  400 mg Oral BID WC  . metoprolol succinate  12.5 mg Oral QHS  . metoprolol succinate  25 mg Oral Daily  . multivitamin with minerals  1 tablet Oral Daily  . nystatin  5 mL Oral QID  . pantoprazole (PROTONIX) IV  40 mg Intravenous Q12H   Continuous Infusions: . sodium chloride 0  mL/hr at 09/08/18 0620  . piperacillin-tazobactam (ZOSYN)  IV 3.375 g (09/10/18 1255)    Assessment/Plan:  1. Upper GI bleed with epigastric abdominal pain.  Patient received 1 dose of vitamin K upon presentation.  Patient on Protonix.  Endoscopy done and reported as negative.  colonoscopy today has revealed diverticulosis.  No active bleeding.Marland Kitchen.  He is recommending outpatient follow-up with Dr. Norma Fredricksonoledo for possible outpatient capsule study a week hemoglobin has come down to 8.3.--8.0 continue to watch for need for transfusion. 2. Coagulopathy with high INR.  Vitamin K given on presentation.  INR down to 1.8.  Resume Coumadin if okay with GI 3. Atrial fibrillation on metoprolol and antiarrhythmic.  Holding Coumadin at this time secondary to coagulopathy and GI bleed.  Risk of stroke higher off anticoagulation. 4. Suspected diverticulitis.  Switched antibiotics to Zosyn.  Cipro can cause arrhythmia with her antiarrhythmic.  Patient does not tolerate Flagyl well.  Colonoscopy with diverticulosis but no diverticulitis 5. Acute kidney injury.  Restart IV fluids.  Hold losartan.  Renal function is improving creatinine trended down to 1.19  Code Status:     Code Status Orders   (From admission, onward)         Start     Ordered   09/06/18 1321  Full code  Continuous     09/06/18 1321        Code Status History    This patient has a current code status but no historical code status.   Advance Care Planning Activity     Family Communication: Call placed to the daughter to call back  disposition Plan: To be determined  Consultants:  Gastroenterology  Antibiotics:  Zosyn  Time spent:33 minutes  Deanna ArtisAruna Chudney Scheffler  [employer]

## 2018-09-10 NOTE — Anesthesia Post-op Follow-up Note (Signed)
Anesthesia QCDR form completed.        

## 2018-09-10 NOTE — H&P (Signed)
Jonathon Bellows, MD [address], [place], [place], [place], [address] [address], [place], [place], [place], [address] Phone: [phone]  Fax: [phone]  Primary Care Physician:  Marinda Elk, MD   Pre-Procedure History & Physical: HPI:  Tina Barron is a 85 y.o. female is here for an colonoscopy.   Past Medical History:  Diagnosis Date  . Anxiety and depression   . Atherosclerosis   . B12 deficiency   . Degenerative joint disease (DJD) of hip   . Fibromyalgia syndrome   . HH (hiatus hernia)   . Hypertension   . IBS (irritable bowel syndrome)   . Osteopenia   . Paroxysmal A-fib ([place])   . Sinoatrial node dysfunction (HCC)    S/p pacemaker implant 04/17/08. Atrial lead revision 06/08/08    . 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 APPENDECTOMY  1952  . CARDIAC VALVE REPLACEMENT    . ESOPHAGOGASTRODUODENOSCOPY N/A 09/09/2018   Procedure: ESOPHAGOGASTRODUODENOSCOPY (EGD);  Surgeon: Toledo, Benay Pike, MD;  Location: [facility] ENDOSCOPY;  Service: Gastroenterology;  Laterality: N/A;  . [place]  . PARTIAL HYSTERECTOMY  1972  . PLACEMENT OF BREAST IMPLANTS  1976  . REPLACEMENT TOTAL HIP W/  RESURFACING IMPLANTS  2015    Prior to Admission medications   Medication Sig Start Date End Date Taking? Authorizing Provider  acetaminophen (TYLENOL) 500 MG tablet Take 500 mg by mouth every 6 (six) hours as needed.   Yes [provider]  losartan (COZAAR) 50 MG tablet Take 50 mg by mouth daily.    Yes [provider]  metoprolol succinate (TOPROL-XL) 25 MG 24 hr tablet Take 12.5-25 mg by mouth 2 (two) times a day. Take 25 mg in the morning and 12.5 mg at bedtime 12/13/15  Yes [provider]  MULTAQ 400 MG tablet Take 400 mg by mouth 2  (two) times daily with a meal.  12/17/15  Yes [provider]  Multiple Vitamins-Minerals (MULTIVITAMIN PO) Take by mouth.   Yes [provider]  warfarin (COUMADIN) 2.5 MG tablet Take 2.5 mg by mouth one time only at 6 PM.  07/09/15  Yes [provider]    Allergies as of 09/06/2018 - Review Complete 09/06/2018  Allergen Reaction Noted  . Amlodipine Itching 05/31/2013  . Diltiazem hcl Hives   . Duloxetine Other (See Comments) 05/31/2013  . Other Other (See Comments) and Hives   . Penicillin g Itching 08/14/2013  . Shellfish allergy    . Doxycycline  10/20/2016  . Hydrocortisone Rash 05/31/2013  . Prednisone Rash 12/20/2014    Family History  Problem Relation Age of Onset  . Lung cancer Father   . Lung cancer Brother   . Breast cancer Daughter   . CVA Mother     Social History   Socioeconomic History  . Marital status: Married    Spouse name: Not on file  . Number of children: Not on file  . Years of education: Not on file  . Highest education level: Not on file  Occupational History  . Not on file  Social Needs  . Financial resource strain: Not on file  . Food insecurity    Worry: Not on file    Inability: Not on file  .  Transportation needs    Medical: Not on file    Non-medical: Not on file  Tobacco Use  . Smoking status: Never Smoker  . Smokeless tobacco: Never Used  Substance and Sexual Activity  . Alcohol use: Not on file  . Drug use: Not on file  . Sexual activity: Not on file  Lifestyle  . Physical activity    Days per week: Not on file    Minutes per session: Not on file  . Stress: Not on file  Relationships  . Social Musicianconnections    Talks on phone: Not on file    Gets together: Not on file    Attends religious service: Not on file    Active member of club or organization: Not on file    Attends meetings of clubs or organizations: Not on file    Relationship status: Not on file  . Intimate partner violence    Fear of  current or ex partner: Not on file    Emotionally abused: Not on file    Physically abused: Not on file    Forced sexual activity: Not on file  Other Topics Concern  . Not on file  Social History Narrative  . Not on file    Review of Systems: See HPI, otherwise negative ROS  Physical Exam: BP (!) 111/53 (BP Location: Right Arm)   Pulse 64   Temp 98.3 F (36.8 C) (Oral)   Resp 16   Ht 5\' 7"  (1.702 m)   Wt 58.7 kg   SpO2 100%   BMI 20.28 kg/m  General:   Alert,  pleasant and cooperative in NAD Head:  Normocephalic and atraumatic. Neck:  Supple; no masses or thyromegaly. Lungs:  Clear throughout to auscultation, normal respiratory effort.    Heart:  +S1, +S2, Regular rate and rhythm, No edema. Abdomen:  Soft, nontender and nondistended. Normal bowel sounds, without guarding, and without rebound.   Neurologic:  Alert and  oriented x4;  grossly normal neurologically.  Impression/Plan: Tina Barron is here for an colonoscopy to be performed for GI bleeding  Risks, benefits, limitations, and alternatives regarding  colonoscopy have been reviewed with the patient.  Questions have been answered.  All parties agreeable.   Tina MoodKiran Juanette Urizar, MD  09/10/2018, 10:34 AM

## 2018-09-10 NOTE — Anesthesia Postprocedure Evaluation (Signed)
Anesthesia Post Note  Patient: Tina Barron  Procedure(s) Performed: COLONOSCOPY (N/A )  Patient location during evaluation: PACU Anesthesia Type: General Level of consciousness: awake and alert Pain management: pain level controlled Vital Signs Assessment: post-procedure vital signs reviewed and stable Respiratory status: spontaneous breathing and respiratory function stable Cardiovascular status: stable Anesthetic complications: no     Last Vitals:  Vitals:   09/10/18 1140 09/10/18 1150  BP: (!) 103/44 (!) 119/53  Pulse: (!) 56 (!) 56  Resp: 19 (!) 21  Temp:    SpO2: 100% 100%    Last Pain:  Vitals:   09/10/18 1150  TempSrc:   PainSc: 0-No pain                 [name] K

## 2018-09-11 DIAGNOSIS — R1011 Right upper quadrant pain: Secondary | ICD-10-CM

## 2018-09-11 LAB — CBC
HCT: 25.1 % — ABNORMAL LOW (ref 36.0–46.0)
Hemoglobin: 7.8 g/dL — ABNORMAL LOW (ref 12.0–15.0)
MCH: 31.5 pg (ref 26.0–34.0)
MCHC: 31.1 g/dL (ref 30.0–36.0)
MCV: 101.2 fL — ABNORMAL HIGH (ref 80.0–100.0)
Platelets: 181 10*3/uL (ref 150–400)
RBC: 2.48 MIL/uL — ABNORMAL LOW (ref 3.87–5.11)
RDW: 15.1 % (ref 11.5–15.5)
WBC: 3.3 10*3/uL — ABNORMAL LOW (ref 4.0–10.5)
nRBC: 0 % (ref 0.0–0.2)

## 2018-09-11 LAB — PROTIME-INR
INR: 2 — ABNORMAL HIGH (ref 0.8–1.2)
Prothrombin Time: 22.8 seconds — ABNORMAL HIGH (ref 11.4–15.2)

## 2018-09-11 MED ORDER — SODIUM CHLORIDE 0.9 % IV SOLN
INTRAVENOUS | Status: AC
  Administered 2018-09-11: 10:00:00 via INTRAVENOUS

## 2018-09-11 MED ORDER — WARFARIN SODIUM 2.5 MG PO TABS: 2.5000 mg | ORAL_TABLET | Freq: Once | ORAL | Status: DC

## 2018-09-11 MED ORDER — WARFARIN SODIUM 1 MG PO TABS
1.5000 mg | ORAL_TABLET | Freq: Once | ORAL | Status: DC
  Filled 2018-09-11: qty 1

## 2018-09-11 NOTE — Consult Note (Signed)
[place] for Warfarin  Indication: atrial fibrillation  Allergies  Allergen Reactions  . Amlodipine Itching  . Diltiazem Hcl Hives  . Duloxetine Other (See Comments)    Hyperactivity   . Other Other (See Comments) and Hives    Uncoded Allergy. Allergen: Shellfish Uncoded Allergy. Allergen: Msg  . Penicillin G Itching  . Shellfish Allergy   . Doxycycline     Blurred vision  . Hydrocortisone Rash  . Prednisone Rash    Patient Measurements: Height: 5\' 7"  (170.2 cm) Weight: 129 lb 8 oz (58.7 kg) IBW/kg (Calculated) : 61.6   Vital Signs: Temp: 98.8 F (37.1 C) (06/28 1220) Temp Source: Oral (06/28 1220) BP: 109/53 (06/28 1220) Pulse Rate: 66 (06/28 1220)  Labs: Recent Labs    09/09/18 0442 09/10/18 0445 09/10/18 1547 09/11/18 0545  HGB 8.3* 8.0* 8.5* 7.8*  HCT  --   --  26.6* 25.1*  PLT  --   --  175 181  LABPROT 20.7* 20.8*  --  22.8*  INR 1.8* 1.8*  --  2.0*  CREATININE 1.35* 1.19*  --   --     Estimated Creatinine Clearance: 32.6 mL/min (A) (by C-G formula based on SCr of 1.19 mg/dL (H)).   Medications:  -PTA warfarin 2.5 mg QD @ 1800     Vitamin K PO 10 mg x 2 doses  -No anticoagulants for this admission -Dronedarone (PTA med)  Hospital events: 6/26- EGD-  negative 6/27-  Colonoscopy- revealed diverticulosis  6/28- HIDA scan and surgery consult pending   Assessment: Patient was admitted for GI bleed (melena) and anemia. Warfarin has been held since admitted. On admission, her INR was elevated. Of note, patient has been NPO for ~ 2 days for an EGD and colonoscopy - which could elevate the INR. Presently no active bleeding and INR is therapeutic at this time. Patient had a drop in Hgb since yesterday.   Patient was scheduled to have 1.5 mg of warfarin tonight. Given patient will have a HIDA scan and awaiting surgery consult, will likely need to HOLD warfarin for today.    Drug interactions:  -Dronedarone - may  increase INR/risk of bleeding -Zosyn - may increase INR  Date  INR  Warfarin (mg)  6/23 6.4 Held (vitamin K)  6/24 5.2  Held (vitamin K)  6/25 2.7 Held  6/26 1.8 Held   6/27 1.8 2.5mg   6/28 2.0        Goal of Therapy:  INR 2-3 Monitor platelets by anticoagulation protocol: Yes   Plan:  6/28: Will HOLD warfarin tonight and follow up on INR with AM labs.   Will order CBC and INR with AM labs  INRs daily while on antibiotics per protocol.   Rowland Lathe, PharmD Clinical Pharmacist 09/11/2018 2:06 PM

## 2018-09-11 NOTE — Consult Note (Signed)
Date of Consultation:  09/11/2018  Requesting Physician:  Ramonita LabAruna Gouru, MD  Reason for Consultation:  Abdominal pain  History of Present Illness: Tina Barron is a 85 y.o. female admitted on 6/23 with GI bleed.  She had been recently diagnosed with possible diverticulitis and started on antibiotics.  During her hospital stay, she underwent EGD on 6/26 and colonoscopy on 6/27, both of which did not reveal any source of bleeding.  Her bleeding has stopped since.  Additionally, the patient reports a month or so history of diffuse abdominal pain or discomfort, mostly centered in the right upper quadrant.  She reports feeling pain after eating, which starts in the RUQ, and then spreads out to the rest of the abdomen.  She also feels bloatedness with this, and her symptoms do not improve until she has a bowel movement afterwards.  She reports her pain never fully goes away and has been constantly present throughout that time.  The pain does not radiate to the back.  There is no associated nausea or vomiting.  She does have some degree of diarrhea.  On admission, she underwent U/S of her abdomen which showed cholelithiasis but no evidence of cholecystitis.  KUB also did not reveal any pathology.  An outpatient CT scan on 6/19 showed possible diverticulitis of the left and sigmoid colon.  Past Medical History: Past Medical History:  Diagnosis Date  . Anxiety and depression   . Atherosclerosis   . B12 deficiency   . Degenerative joint disease (DJD) of hip   . Fibromyalgia syndrome   . HH (hiatus hernia)   . Hypertension   . IBS (irritable bowel syndrome)   . Osteopenia   . Paroxysmal A-fib (HCC)   . Sinoatrial node dysfunction (HCC)    S/p pacemaker implant 04/17/08. Atrial lead revision 06/08/08    . 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 APPENDECTOMY  1952  . CARDIAC VALVE REPLACEMENT    . ESOPHAGOGASTRODUODENOSCOPY N/A 09/09/2018   Procedure: ESOPHAGOGASTRODUODENOSCOPY (EGD);  Surgeon: Toledo, Boykin Nearingeodoro K, MD;  Location: [facility] ENDOSCOPY;  Service: Gastroenterology;  Laterality: N/A;  . LUMBAR LAMINECTOMY  1989  . PARTIAL HYSTERECTOMY  1972  . PLACEMENT OF BREAST IMPLANTS  1976  . REPLACEMENT TOTAL HIP W/  RESURFACING IMPLANTS  2015    Home Medications: Prior to Admission medications   Medication Sig Start Date End Date Taking? Authorizing Provider  acetaminophen (TYLENOL) 500 MG tablet Take 500 mg by mouth every 6 (six) hours as needed.   Yes [provider]  losartan (COZAAR) 50 MG tablet Take 50 mg by mouth daily.    Yes [provider]  metoprolol succinate (TOPROL-XL) 25 MG 24 hr tablet Take 12.5-25 mg by mouth 2 (two) times a day. Take 25 mg in the morning and 12.5 mg at bedtime 12/13/15  Yes [provider]  MULTAQ 400 MG tablet Take 400 mg by mouth 2 (two) times daily with a meal.  12/17/15  Yes [provider]  Multiple Vitamins-Minerals (MULTIVITAMIN PO) Take by mouth.   Yes [provider]  warfarin (COUMADIN) 2.5 MG tablet Take 2.5 mg by mouth one time only at 6 PM.  07/09/15  Yes [provider]    Allergies: Allergies  Allergen Reactions  .  Amlodipine Itching  . Diltiazem Hcl Hives  . Duloxetine Other (See Comments)    Hyperactivity   . Other Other (See Comments) and Hives    Uncoded Allergy. Allergen: Shellfish Uncoded Allergy. Allergen: Msg  . Penicillin G Itching  . Shellfish Allergy   . Doxycycline     Blurred vision  . Hydrocortisone Rash  . Prednisone Rash    Social History:  reports that she has never smoked. She has never used smokeless tobacco. No history on file for alcohol and drug.   Family History: Family History  Problem Relation Age of Onset  . Lung cancer Father    . Lung cancer Brother   . Breast cancer Daughter   . CVA Mother     Review of Systems: Review of Systems  Constitutional: Negative for chills and fever.  HENT: Negative for hearing loss.   Respiratory: Negative for shortness of breath.   Cardiovascular: Negative for chest pain.  Gastrointestinal: Positive for abdominal pain, blood in stool, diarrhea and melena. Negative for constipation, nausea and vomiting.  Genitourinary: Negative for dysuria.  Musculoskeletal: Negative for myalgias.  Neurological: Negative for dizziness.  Psychiatric/Behavioral: Negative for depression.    Physical Exam BP 130/61 (BP Location: Right Arm)   Pulse 68   Temp 98.3 F (36.8 C) (Oral)   Resp 16   Ht 5\' 7"  (1.702 m)   Wt 58.7 kg   SpO2 100%   BMI 20.28 kg/m  CONSTITUTIONAL: No acute distress HEENT:  Normocephalic, atraumatic, extraocular motion intact. NECK: Trachea is midline, and there is no jugular venous distension. RESPIRATORY:  Lungs are clear, and breath sounds are equal bilaterally. Normal respiratory effort without pathologic use of accessory muscles. CARDIOVASCULAR: Heart is regular without murmurs, gallops, or rubs. GI: The abdomen is soft, non-distended, with mild diffuse soreness, but more tenderness in the right upper quadrant and epigastric areas.  Negative Murphy's sign.  MUSCULOSKELETAL:  Normal muscle strength and tone in all four extremities.  No peripheral edema or cyanosis. SKIN: Skin turgor is normal. There are no pathologic skin lesions.  NEUROLOGIC:  Motor and sensation is grossly normal.  Cranial nerves are grossly intact. PSYCH:  Alert and oriented to person, place and time. Affect is normal.  Laboratory Analysis: Results for orders placed or performed during the hospital encounter of 09/06/18 (from the past 24 hour(s))  Protime-INR     Status: Abnormal   Collection Time: 09/11/18  5:45 AM  Result Value Ref Range   Prothrombin Time 22.8 (H) 11.4 - 15.2 seconds    INR 2.0 (H) 0.8 - 1.2  CBC     Status: Abnormal   Collection Time: 09/11/18  5:45 AM  Result Value Ref Range   WBC 3.3 (L) 4.0 - 10.5 K/uL   RBC 2.48 (L) 3.87 - 5.11 MIL/uL   Hemoglobin 7.8 (L) 12.0 - 15.0 g/dL   HCT 25.1 (L) 36.0 - 46.0 %   MCV 101.2 (H) 80.0 - 100.0 fL   MCH 31.5 26.0 - 34.0 pg   MCHC 31.1 30.0 - 36.0 g/dL   RDW 15.1 11.5 - 15.5 %   Platelets 181 150 - 400 K/uL   nRBC 0.0 0.0 - 0.2 %    Imaging: No results found.  Assessment and Plan: This is a 85 y.o. female admitted with GI bleed that has resolved, though no source identified, and also with abdominal pain worse in the RUQ for the past month.  Discussed with the patient that her symptoms could be  related to her gallbladder, though they do not follow straightforward pathway.  A HIDA scan is reasonable to check if she may have biliary dyskinesia, and I suspect low risk for cholecystitis lasting this long.  Her EGD and colonoscopy were negative, but Dr. Tobi BastosAnna has recommended a capsule study as outpatient.  There could potentially be other GI reasons for her abdominal discomfort, but discussed with the patient that a HIDA scan is a good first step.  If negative, would rather further rule out other GI issues prior to fully committing to gallbladder surgery.  Further recs pending HIDA scan tomorrow.  Face-to-face time spent with the patient and care providers was 55 minutes, with more than 50% of the time spent counseling, educating, and coordinating care of the patient.     Howie IllJose Luis Abrianna Sidman, MD [facility] Pg:  [phone]

## 2018-09-11 NOTE — Consult Note (Signed)
[place] for Warfarin  Indication: atrial fibrillation  Allergies  Allergen Reactions  . Amlodipine Itching  . Diltiazem Hcl Hives  . Duloxetine Other (See Comments)    Hyperactivity   . Other Other (See Comments) and Hives    Uncoded Allergy. Allergen: Shellfish Uncoded Allergy. Allergen: Msg  . Penicillin G Itching  . Shellfish Allergy   . Doxycycline     Blurred vision  . Hydrocortisone Rash  . Prednisone Rash    Patient Measurements: Height: 5\' 7"  (170.2 cm) Weight: 129 lb 8 oz (58.7 kg) IBW/kg (Calculated) : 61.6   Vital Signs: Temp: 98.4 F (36.9 C) (06/28 0445) Temp Source: Oral (06/28 0445) BP: 97/51 (06/28 0445) Pulse Rate: 61 (06/28 0445)  Labs: Recent Labs    09/09/18 0442 09/10/18 0445 09/10/18 1547 09/11/18 0545  HGB 8.3* 8.0* 8.5* 7.8*  HCT  --   --  26.6* 25.1*  PLT  --   --  175 181  LABPROT 20.7* 20.8*  --  22.8*  INR 1.8* 1.8*  --  2.0*  CREATININE 1.35* 1.19*  --   --     Estimated Creatinine Clearance: 32.6 mL/min (A) (by C-G formula based on SCr of 1.19 mg/dL (H)).   Medications:  -PTA warfarin 2.5 mg QD @ 1800     Vitamin K PO 10 mg x 2 doses  -No anticoagulants for this admission -Dronedarone (PTA med)  Hospital events: 6/26- EGD-  negative 6/27-  Colonoscopy- revealed diverticulosis    Assessment: Patient was admitted for GI bleed (melena) and anemia. Warfarin has been held since admitted. On admission, her INR was elevated. Of note, patient has been NPO for ~ 2 days for an EGD and colonoscopy - which could elevate the INR. Presently no active bleeding and INR is subtherapeutic at this time. Scr variable and Hgb has been stable. Will resume home warfarin dose today, but may need to decrease dose of warfarin during this admission given patient was admitted with INR being supratherapeutic on 2.5 mg.  Discussed with attending and it was decided to restart warfarin this evening.    Drug  interactions:  -Dronedarone - may increase INR/risk of bleeding  Date  INR  Warfarin (mg)  6/23 6.4 Held (vitamin K)  6/24 5.2  Held (vitamin K)  6/25 2.7 Held  6/26 1.8 Held   6/27 1.8 2.5mg   6/28 2.0        Goal of Therapy:  INR 2-3 Monitor platelets by anticoagulation protocol: Yes   Plan:  6/28 INR: 2.0.  Level is therapeutic. Patient is receiving Abx which can increase INR.  Will order reduced dose of warfarin 1.5mg  tonight x 1 dose.   Will order CBC and INR with AM labs  INRs daily while on antibiotics per protocol.   Pernell Dupre, PharmD, BCPS Clinical Pharmacist 09/11/2018 7:02 AM

## 2018-09-11 NOTE — Progress Notes (Signed)
Patient ID: Tina Barron, female   DOB: [birth year], 85 y.o.   MRN: [id number]  [facility] PROGRESS NOTE  Tina Barron MGQ:[id number] DOB: [birth year] DOA: 09/06/2018 PCP: Marinda Elk, MD  HPI/Subjective: Patient is reporting right upper quadrant abdominal pain radiating to the left lower quadrant and becoming diffuse pain.  Had colonoscopy 09/10/2018 which is normal for colonoscopy.  Coumadin resumed 09/10/2018 and INR at 2.0 Objective: Vitals:   09/11/18 0445 09/11/18 1220  BP: (!) 97/51 (!) 109/53  Pulse: 61 66  Resp: 16 16  Temp: 98.4 F (36.9 C) 98.8 F (37.1 C)  SpO2: 100% 100%    Intake/Output Summary (Last 24 hours) at 09/11/2018 1317 Last data filed at 09/11/2018 2671 Gross per 24 hour  Intake 128.75 ml  Output -  Net 128.75 ml   Filed Weights   09/06/18 1035 09/06/18 2030 09/10/18 1010  Weight: 56.7 kg 58.7 kg 58.7 kg    ROS: Review of Systems  Constitutional: Negative for chills and fever.  Eyes: Negative for blurred vision.  Respiratory: Negative for cough and shortness of breath.   Cardiovascular: Negative for chest pain.  Gastrointestinal: Positive for abdominal pain. Negative for constipation, diarrhea, nausea and vomiting.  Genitourinary: Negative for dysuria.  Musculoskeletal: Negative for joint pain.  Neurological: Negative for dizziness and headaches.   Exam: Physical Exam  Constitutional: She is oriented to person, place, and time.  HENT:  Nose: No mucosal edema.  Mouth/Throat: No oropharyngeal exudate or posterior oropharyngeal edema.  Eyes: Pupils are equal, round, and reactive to light. Conjunctivae, EOM and lids are normal.  Neck: No JVD present. Carotid bruit is not present. No edema present. No thyroid mass and no thyromegaly present.  Cardiovascular: S1 normal and S2 normal. Exam reveals no gallop.  No murmur heard. Pulses:      Dorsalis pedis pulses are 2+ on the right side and 2+ on the left side.  Respiratory: No respiratory  distress. She has no wheezes. She has no rhonchi. She has no rales.  GI: Soft. Bowel sounds are normal. There is abdominal tenderness.  Musculoskeletal:     Right ankle: She exhibits no swelling.     Left ankle: She exhibits no swelling.  Lymphadenopathy:    She has no cervical adenopathy.  Neurological: She is alert and oriented to person, place, and time. No cranial nerve deficit.  Skin: Skin is warm. No rash noted. Nails show no clubbing.  Psychiatric: She has a normal mood and affect.      Data Reviewed: Basic Metabolic Panel: Recent Labs  Lab 09/06/18 1055 09/07/18 0514 09/08/18 0416 09/09/18 0442 09/10/18 0445  NA 139 137 139  --   --   K 4.2 4.5 4.1  --   --   CL 109 108 110  --   --   CO2 22 23 20*  --   --   GLUCOSE 93 82 92  --   --   BUN 29* 22 16  --   --   CREATININE 1.42* 1.12* 1.44* 1.35* 1.19*  CALCIUM 9.2 8.7* 8.7*  --   --    Liver Function Tests: Recent Labs  Lab 09/06/18 1055  AST 19  ALT 11  ALKPHOS 50  BILITOT 0.7  PROT 7.2  ALBUMIN 3.8   CBC: Recent Labs  Lab 09/06/18 1055  09/07/18 0514 09/08/18 0416 09/09/18 0442 09/10/18 0445 09/10/18 1547 09/11/18 0545  WBC 5.2  --  3.6* 4.2  --   --  3.1* 3.3*  NEUTROABS 3.9  --   --   --   --   --   --   --   HGB 9.3*   < > 9.0* 9.1* 8.3* 8.0* 8.5* 7.8*  HCT 29.2*  --  28.1* 28.6*  --   --  26.6* 25.1*  MCV 100.7*  --  99.3 99.0  --   --  100.8* 101.2*  PLT 221  --  188 225  --   --  175 181   < > = values in this interval not displayed.    Recent Results (from the past 240 hour(s))  SARS Coronavirus 2 (CEPHEID - Performed in [facility] lab), Hosp Order     Status: None   Collection Time: 09/06/18 12:32 PM   Specimen: Nasopharyngeal Swab  Result Value Ref Range Status   SARS Coronavirus 2 NEGATIVE NEGATIVE Final    Comment: (NOTE) If result is NEGATIVE SARS-CoV-2 target nucleic acids are NOT DETECTED. The SARS-CoV-2 RNA is generally detectable in upper and lower   respiratory specimens during the acute phase of infection. The lowest  concentration of SARS-CoV-2 viral copies this assay can detect is 250  copies / mL. A negative result does not preclude SARS-CoV-2 infection  and should not be used as the sole basis for treatment or other  patient management decisions.  A negative result may occur with  improper specimen collection / handling, submission of specimen other  than nasopharyngeal swab, presence of viral mutation(s) within the  areas targeted by this assay, and inadequate number of viral copies  (<250 copies / mL). A negative result must be combined with clinical  observations, patient history, and epidemiological information. If result is POSITIVE SARS-CoV-2 target nucleic acids are DETECTED. The SARS-CoV-2 RNA is generally detectable in upper and lower  respiratory specimens dur ing the acute phase of infection.  Positive  results are indicative of active infection with SARS-CoV-2.  Clinical  correlation with patient history and other diagnostic information is  necessary to determine patient infection status.  Positive results do  not rule out bacterial infection or co-infection with other viruses. If result is PRESUMPTIVE POSTIVE SARS-CoV-2 nucleic acids MAY BE PRESENT.   A presumptive positive result was obtained on the submitted specimen  and confirmed on repeat testing.  While 2019 novel coronavirus  (SARS-CoV-2) nucleic acids may be present in the submitted sample  additional confirmatory testing may be necessary for epidemiological  and / or clinical management purposes  to differentiate between  SARS-CoV-2 and other Sarbecovirus currently known to infect humans.  If clinically indicated additional testing with an alternate test  methodology [phone]([id number]) is advised. The SARS-CoV-2 RNA is generally  detectable in upper and lower respiratory sp ecimens during the acute  phase of infection. The expected result is Negative. Fact  Sheet for Patients:  BoilerBrush.com.cyhttps://www.fda.gov/media/136312/download Fact Sheet for Healthcare Providers: https://pope.com/https://www.fda.gov/media/136313/download This test is not yet approved or cleared by the [place] FDA and has been authorized for detection and/or diagnosis of SARS-CoV-2 by FDA under an Emergency Use Authorization (EUA).  This EUA will remain in effect (meaning this test can be used) for the duration of the COVID-19 declaration under Section 564(b)(1) of the Act, 21 U.S.C. section 360bbb-3(b)(1), unless the authorization is terminated or revoked sooner. Performed at [facility], [address] [place], [place] [address]      Studies: No results found.  Scheduled Meds: . dronedarone  400 mg Oral BID WC  .  metoprolol succinate  12.5 mg Oral QHS  . metoprolol succinate  25 mg Oral Daily  . multivitamin with minerals  1 tablet Oral Daily  . nystatin  5 mL Oral QID  . pantoprazole (PROTONIX) IV  40 mg Intravenous Q12H  . warfarin  1.5 mg Oral ONCE-1800  . Warfarin - Pharmacist Dosing Inpatient   Does not apply q1800   Continuous Infusions: . sodium chloride Stopped (09/11/18 0609)  . piperacillin-tazobactam (ZOSYN)  IV 3.375 g (09/11/18 [id number])    Assessment/Plan:  1. Upper GI bleed with epigastric abdominal pain.  Patient received 1 dose of vitamin K upon presentation.  Patient on Protonix.  Endoscopy done and reported as negative.  colonoscopy 09/10/2018 has revealed diverticulosis but no diverticulitis no active bleeding.Marland Kitchen.  He is recommending outpatient follow-up with Dr. Norma Fredricksonoledo for possible outpatient capsule study a week hemoglobin has come down to 8.3.--8.0 -7.8 continue to watch for need for transfusion. 2. Acute right upper quadrant pain-CT scan with cholelithiasis.  Will get HIDA scan and repeat LFTs.  Will consider surgical consult if needed 3. Coagulopathy with high INR.  Vitamin K given on presentation.  INR at 2.  Will hold Coumadin tonight 4.  atrial  fibrillation on metoprolol and antiarrhythmic.  Holding Coumadin at this time   Risk of stroke higher off anticoagulation. 5. Suspected diverticulitis.  Switched antibiotics to Zosyn.  Cipro can cause arrhythmia with her antiarrhythmic.  Patient does not tolerate Flagyl well.  Colonoscopy with diverticulosis but no diverticulitis 6. Acute kidney injury.  Restart IV fluids.  Hold losartan.  Renal function is improving creatinine trended down to 1.19  Code Status:     Code Status Orders  (From admission, onward)         Start     Ordered   09/06/18 1321  Full code  Continuous     09/06/18 1321        Code Status History    This patient has a current code status but no historical code status.   Advance Care Planning Activity     Family Communication: Call placed to the daughter to call back  disposition Plan: To be determined  Consultants:  Gastroenterology  Antibiotics:  Zosyn  Time spent:33 minutes  Deanna ArtisAruna Joda Braatz  [facility]

## 2018-09-11 NOTE — Progress Notes (Signed)
Spoke with [facility] re: HIDA scan ordered. She stated it would be scheduled to be done tomorrow as it is not STAT or emergent which is their protocol for the weekend. Placed order to be NPO after midnight per [place].

## 2018-09-11 NOTE — Progress Notes (Signed)
Jonathon Bellows , MD [address], [place], [place], [place], [address] [address], [place], [place], [place], [address] Phone: [phone]  Fax: [phone]   Tina Barron is being followed for gi bleed   Subjective: No further melena, main issue is RUQ pain , continious for 6 weeks, no aggrevating or relieving factors   Objective: Vital signs in last 24 hours: Vitals:   09/10/18 1150 09/10/18 1213 09/10/18 1936 09/11/18 0445  BP: (!) 119/53 133/64 110/66 (!) 97/51  Pulse: (!) 56 (!) 56 (!) 110 61  Resp: (!) 21 18 16 16   Temp:  97.9 F (36.6 C) 98.3 F (36.8 C) 98.4 F (36.9 C)  TempSrc:  Oral Oral Oral  SpO2: 100% 100% 100% 100%  Weight:      Height:       Weight change:   Intake/Output Summary (Last 24 hours) at 09/11/2018 1037 Last data filed at 09/11/2018 4081 Gross per 24 hour  Intake 128.75 ml  Output -  Net 128.75 ml     Exam: Heart:: Regular rate and rhythm, S1S2 present or without murmur or extra heart sounds Lungs: normal, clear to auscultation and clear to auscultation and percussion Abdomen: soft, nontender, normal bowel sounds   Lab Results: @LABTEST2 @ Micro Results: Recent Results (from the past 240 hour(s))  SARS Coronavirus 2 (CEPHEID - Performed in [facility] hospital lab), Hosp Order     Status: None   Collection Time: 09/06/18 12:32 PM   Specimen: Nasopharyngeal Swab  Result Value Ref Range Status   SARS Coronavirus 2 NEGATIVE NEGATIVE Final    Comment: (NOTE) If result is NEGATIVE SARS-CoV-2 target nucleic acids are NOT DETECTED. The SARS-CoV-2 RNA is generally detectable in upper and lower  respiratory specimens during the acute phase of infection. The lowest  concentration of SARS-CoV-2 viral copies this assay can detect is 250  copies / mL. A negative result does not preclude SARS-CoV-2 infection  and should not be used as the sole basis for treatment or other  patient management decisions.  A negative result may occur with   improper specimen collection / handling, submission of specimen other  than nasopharyngeal swab, presence of viral mutation(s) within the  areas targeted by this assay, and inadequate number of viral copies  (<250 copies / mL). A negative result must be combined with clinical  observations, patient history, and epidemiological information. If result is POSITIVE SARS-CoV-2 target nucleic acids are DETECTED. The SARS-CoV-2 RNA is generally detectable in upper and lower  respiratory specimens dur ing the acute phase of infection.  Positive  results are indicative of active infection with SARS-CoV-2.  Clinical  correlation with patient history and other diagnostic information is  necessary to determine patient infection status.  Positive results do  not rule out bacterial infection or co-infection with other viruses. If result is PRESUMPTIVE POSTIVE SARS-CoV-2 nucleic acids MAY BE PRESENT.   A presumptive positive result was obtained on the submitted specimen  and confirmed on repeat testing.  While 2019 novel coronavirus  (SARS-CoV-2) nucleic acids may be present in the submitted sample  additional confirmatory testing may be necessary for epidemiological  and / or clinical management purposes  to differentiate between  SARS-CoV-2 and other Sarbecovirus currently known to infect humans.  If clinically indicated additional testing with an alternate test  methodology [phone]) is advised. The SARS-CoV-2 RNA is generally  detectable in upper and lower respiratory sp ecimens during the acute  phase of infection. The expected  result is Negative. Fact Sheet for Patients:  BoilerBrush.com.cyhttps://www.fda.gov/media/136312/download Fact Sheet for Healthcare Providers: https://pope.com/https://www.fda.gov/media/136313/download This test is not yet approved or cleared by the [place] FDA and has been authorized for detection and/or diagnosis of SARS-CoV-2 by FDA under an Emergency Use Authorization (EUA).  This EUA will  remain in effect (meaning this test can be used) for the duration of the COVID-19 declaration under Section 564(b)(1) of the Act, 21 U.S.C. section 360bbb-3(b)(1), unless the authorization is terminated or revoked sooner. Performed at [facility], [address] [place], [place] [address]    Studies/Results: No results found. Medications: I have reviewed the patient's current medications. Scheduled Meds: . dronedarone  400 mg Oral BID WC  . metoprolol succinate  12.5 mg Oral QHS  . metoprolol succinate  25 mg Oral Daily  . multivitamin with minerals  1 tablet Oral Daily  . nystatin  5 mL Oral QID  . pantoprazole (PROTONIX) IV  40 mg Intravenous Q12H  . warfarin  1.5 mg Oral ONCE-1800  . Warfarin - Pharmacist Dosing Inpatient   Does not apply q1800   Continuous Infusions: . sodium chloride Stopped (09/11/18 0609)  . sodium chloride 125 mL/hr at 09/11/18 1021  . piperacillin-tazobactam (ZOSYN)  IV 3.375 g (09/11/18 0609)   PRN Meds:.sodium chloride, acetaminophen **OR** acetaminophen, diphenhydrAMINE **OR** diphenhydrAMINE, ondansetron **OR** ondansetron (ZOFRAN) IV, oxyCODONE   Assessment: Active Problems:   GI bleed   Tina Barron 85 y.o. female admitted with diverticulitis, GI bleed. EGD+colonoscopy was negative. Suggested outpatient capsule study with Dr Norma Fredricksonoledo. Was on INR which was supratherapeutic . I have been asked to see her back for RUQ pain. RUQ USG shows cholelithiasis . LFT;s are normal. Ashby DawesNature of pain is not typical of biliary colic.   Plan: 1. HIDA scan    LOS: 5 days   Wyline MoodKiran Javarri Segal, MD 09/11/2018, 10:37 AM

## 2018-09-11 NOTE — Consult Note (Deleted)
[place] for Warfarin  Indication: atrial fibrillation  Allergies  Allergen Reactions  . Amlodipine Itching  . Diltiazem Hcl Hives  . Duloxetine Other (See Comments)    Hyperactivity   . Other Other (See Comments) and Hives    Uncoded Allergy. Allergen: Shellfish Uncoded Allergy. Allergen: Msg  . Penicillin G Itching  . Shellfish Allergy   . Doxycycline     Blurred vision  . Hydrocortisone Rash  . Prednisone Rash    Patient Measurements: Height: 5\' 7"  (170.2 cm) Weight: 129 lb 8 oz (58.7 kg) IBW/kg (Calculated) : 61.6   Vital Signs: Temp: 98.4 F (36.9 C) (06/28 0445) Temp Source: Oral (06/28 0445) BP: 97/51 (06/28 0445) Pulse Rate: 61 (06/28 0445)  Labs: Recent Labs    09/09/18 0442 09/10/18 0445 09/10/18 1547 09/11/18 0545  HGB 8.3* 8.0* 8.5* 7.8*  HCT  --   --  26.6* 25.1*  PLT  --   --  175 181  LABPROT 20.7* 20.8*  --  22.8*  INR 1.8* 1.8*  --  2.0*  CREATININE 1.35* 1.19*  --   --     Estimated Creatinine Clearance: 32.6 mL/min (A) (by C-G formula based on SCr of 1.19 mg/dL (H)).   Medications:  -PTA warfarin 2.5 mg QD @ 1800     Vitamin K PO 10 mg x 2 doses  -No anticoagulants for this admission -Dronedarone (PTA med)  Hospital events: 6/26- EGD-  negative 6/27-  Colonoscopy- revealed diverticulosis    Assessment: Patient was admitted for GI bleed (melena) and anemia. Warfarin has been held since admitted. On admission, her INR was elevated. Of note, patient has been NPO for ~ 2 days for an EGD and colonoscopy - which could elevate the INR. Presently no active bleeding and INR is subtherapeutic at this time. Scr variable and Hgb has been stable. Will resume home warfarin dose today, but may need to decrease dose of warfarin during this admission given patient was admitted with INR being supratherapeutic on 2.5 mg.  Discussed with attending and it was decided to restart warfarin this evening.    Drug  interactions:  -Dronedarone - may increase INR/risk of bleeding  Date  INR  Warfarin (mg)  6/23 6.4 Held (vitamin K)  6/24 5.2  Held (vitamin K)  6/25 2.7 Held  6/26 1.8 Held   6/27 1.8 2.5 mg  6/28 2.0         Goal of Therapy:  INR 2-3 Monitor platelets by anticoagulation protocol: Yes   Plan:  Will continue home warfarin dose of 2.5 mg @ 1800.    Follow up INR with AM labs.   Olivia Canter, RPH 09/11/2018,7:04 AM

## 2018-09-12 ENCOUNTER — Inpatient Hospital Stay: Payer: Medicare Other

## 2018-09-12 ENCOUNTER — Encounter: Payer: Self-pay | Admitting: Radiology

## 2018-09-12 DIAGNOSIS — R1084 Generalized abdominal pain: Secondary | ICD-10-CM

## 2018-09-12 LAB — PROTIME-INR
INR: 2.4 — ABNORMAL HIGH (ref 0.8–1.2)
Prothrombin Time: 25.6 seconds — ABNORMAL HIGH (ref 11.4–15.2)

## 2018-09-12 LAB — HEMOGLOBIN AND HEMATOCRIT, BLOOD
HCT: 26.1 % — ABNORMAL LOW (ref 36.0–46.0)
HCT: 25.8 % — ABNORMAL LOW (ref 36.0–46.0)
Hemoglobin: 8.1 g/dL — ABNORMAL LOW (ref 12.0–15.0)
Hemoglobin: 8.3 g/dL — ABNORMAL LOW (ref 12.0–15.0)

## 2018-09-12 LAB — COMPREHENSIVE METABOLIC PANEL
ALT: 8 U/L (ref 0–44)
AST: 15 U/L (ref 15–41)
Albumin: 3 g/dL — ABNORMAL LOW (ref 3.5–5.0)
Alkaline Phosphatase: 41 U/L (ref 38–126)
Anion gap: 4 — ABNORMAL LOW (ref 5–15)
BUN: 12 mg/dL (ref 8–23)
CO2: 26 mmol/L (ref 22–32)
Calcium: 8.6 mg/dL — ABNORMAL LOW (ref 8.9–10.3)
Chloride: 113 mmol/L — ABNORMAL HIGH (ref 98–111)
Creatinine, Ser: 1.17 mg/dL — ABNORMAL HIGH (ref 0.44–1.00)
GFR calc Af Amer: 50 mL/min — ABNORMAL LOW (ref >60)
GFR calc non Af Amer: 43 mL/min — ABNORMAL LOW (ref >60)
Glucose, Bld: 92 mg/dL (ref 70–99)
Potassium: 4.2 mmol/L (ref 3.5–5.1)
Sodium: 143 mmol/L (ref 135–145)
Total Bilirubin: 0.6 mg/dL (ref 0.3–1.2)
Total Protein: 5.7 g/dL — ABNORMAL LOW (ref 6.5–8.1)

## 2018-09-12 LAB — CBC
HCT: 24 % — ABNORMAL LOW (ref 36.0–46.0)
Hemoglobin: 7.6 g/dL — ABNORMAL LOW (ref 12.0–15.0)
MCH: 31.9 pg (ref 26.0–34.0)
MCHC: 31.7 g/dL (ref 30.0–36.0)
MCV: 100.8 fL — ABNORMAL HIGH (ref 80.0–100.0)
Platelets: 176 10*3/uL (ref 150–400)
RBC: 2.38 MIL/uL — ABNORMAL LOW (ref 3.87–5.11)
RDW: 15.3 % (ref 11.5–15.5)
WBC: 3.6 10*3/uL — ABNORMAL LOW (ref 4.0–10.5)
nRBC: 0 % (ref 0.0–0.2)

## 2018-09-12 MED ORDER — TECHNETIUM TC 99M MEBROFENIN IV KIT
5.0000 | PACK | Freq: Once | INTRAVENOUS | Status: AC | PRN
  Administered 2018-09-12: 5.439 via INTRAVENOUS

## 2018-09-12 NOTE — Progress Notes (Signed)
[facility] SURGICAL PROGRESS NOTE (cpt [phone])  Hospital Day(s): 6.   Post op day(s): 2 Days Post-Op.   Interval History: Patient seen and examined, no acute events or new complaints overnight. Patient reports that "she is feeling miserable" today. She endorses lower abdominal discomfort and feeling bloated. No reports of fever, chills, nausea, or emesis. She is endorsing flatus and reported a bowel movement after her HIDA scan which she thought was bloody. Described this as being "balck." She has been NPO this morning pending HIDA scan.     Review of Systems:  Constitutional: denies fever, chills  HEENT: denies cough or congestion  Respiratory: denies any shortness of breath  Cardiovascular: denies chest pain or palpitations  Gastrointestinal: + abdominal pain (lower), denies N/V, or diarrhea/and bowel function as per interval history Genitourinary: denies burning with urination or urinary frequency   Vital signs in last 24 hours: [min-max] current  Temp:  [98.3 F (36.8 C)-98.6 F (37 C)] 98.6 F (37 C) (06/29 0436) Pulse Rate:  [65-68] 65 (06/29 0436) Resp:  [16] 16 (06/29 0436) BP: (100-130)/(51-61) 100/51 (06/29 0436) SpO2:  [96 %-100 %] 96 % (06/29 0436)     Height: 5\' 7"  (170.2 cm) Weight: 58.7 kg BMI (Calculated): 20.28   Intake/Output last 2 shifts:  06/28 0701 - 06/29 0700 In: 865.6 [I.V.:623.8; IV Piggyback:241.8] Out: 0    Physical Exam:  Constitutional: alert, cooperative and no distress  HENT: normocephalic without obvious abnormality  Eyes: PERRL, EOM's grossly intact and symmetric  Respiratory: breathing non-labored at rest  Cardiovascular: regular rate and sinus rhythm  Gastrointestinal: soft, lower abdominal discomfort with palpation, no RUQ tenderness reported, she reports feeling distended but abdomen is soft, no rebound/guarding   Labs:  CBC Latest Ref Rng & Units 09/12/2018 09/12/2018 09/11/2018  WBC 4.0 - 10.5 K/uL - 3.6(L) 3.3(L)   Hemoglobin 12.0 - 15.0 g/dL 8.1(L) 7.6(L) 7.8(L)  Hematocrit 36.0 - 46.0 % 26.1(L) 24.0(L) 25.1(L)  Platelets 150 - 400 K/uL - 176 181   CMP Latest Ref Rng & Units 09/12/2018 09/10/2018 09/09/2018  Glucose 70 - 99 mg/dL 92 - -  BUN 8 - 23 mg/dL 12 - -  Creatinine 0.44 - 1.00 mg/dL 1.17(H) 1.19(H) 1.35(H)  Sodium 135 - 145 mmol/L 143 - -  Potassium 3.5 - 5.1 mmol/L 4.2 - -  Chloride 98 - 111 mmol/L 113(H) - -  CO2 22 - 32 mmol/L 26 - -  Calcium 8.9 - 10.3 mg/dL 8.6(L) - -  Total Protein 6.5 - 8.1 g/dL 5.7(L) - -  Total Bilirubin 0.3 - 1.2 mg/dL 0.6 - -  Alkaline Phos 38 - 126 U/L 41 - -  AST 15 - 41 U/L 15 - -  ALT 0 - 44 U/L 8 - -     Imaging studies:   HIDA (09/12/2018) personally reviewed ad radiologist report reviewed:  IMPRESSION: Normal hepatobiliary scan.   Assessment/Plan: (ICD-10's: R10.84) 85 y.o. female with generalized abdominal pain and bloating who was admitted for GI bleeding that had resolved, who underwent HIDA after complaints of RUQ pain which was unremarkable.    - Okay for diet as tolerates   - pain control prn; antiemetics prn  - monitor abdominal examination; on-going bowel function, GI bleed    - May consider re-evaluation from GI if truly having more bloody bowel movements  - No indications for emergent surgical intervention at this time  - Monitor hemoglobin; INR  - Further management per primary team; coumadin being held  -  general surgery will sign off; please re-consult if new issues or questions arise  All of the above findings and recommendations were discussed with the patient, and the medical team, and all of patient's questions were answered to her expressed satisfaction.  -- Lynden OxfordZachary Ranell Finelli, PA-C [facility] 09/12/2018, 4:14 PM [phone] M-F: 7am - 4pm

## 2018-09-12 NOTE — Care Management Important Message (Signed)
Important Message  Patient Details  Name: Tina Barron MRN: [id number] Date of Birth: 1932-07-10   Medicare Important Message Given:  Yes     [name] 09/12/2018, 2:04 PM

## 2018-09-12 NOTE — Consult Note (Signed)
[place] for Warfarin  Indication: atrial fibrillation  Patient Measurements: Height: 5\' 7"  (170.2 cm) Weight: 129 lb 8 oz (58.7 kg) IBW/kg (Calculated) : 61.6  Vital Signs: Temp: 98.6 F (37 C) (06/29 0436) Temp Source: Oral (06/29 0436) BP: 100/51 (06/29 0436) Pulse Rate: 65 (06/29 0436)  Labs: Recent Labs    09/10/18 0445 09/10/18 1547 09/11/18 0545 09/12/18 0444  HGB 8.0* 8.5* 7.8* 7.6*  HCT  --  26.6* 25.1* 24.0*  PLT  --  175 181 176  LABPROT 20.8*  --  22.8* 25.6*  INR 1.8*  --  2.0* 2.4*  CREATININE 1.19*  --   --  1.17*    Estimated Creatinine Clearance: 33.2 mL/min (A) (by C-G formula based on SCr of 1.17 mg/dL (H)).   Medications:  -PTA warfarin 2.5 mg daily -Vitamin K PO 10 mg x 2 doses  -No anticoagulants for this admission -Dronedarone (PTA med) -no new DDIs since previous note  Assessment: Patient was admitted for GI bleed (melena) and anemia. Warfarin has been held since admitted. On admission, her INR was elevated. Of note, patient had been NPO for ~ 2 days for an EGD and colonoscopy - which could elevate the INR. Presently no active bleeding and INR is therapeutic at this time. Patient had a small drop in Hgb since yesterday.   Drug interactions:  -Dronedarone - may increase INR/risk of bleeding -Zosyn - may increase INR  Date  INR  Warfarin (mg)  6/23 6.4 Held (vitamin K)  6/24 5.2  Held (vitamin K)  6/25 2.7 Held  6/26 1.8 Held   6/27 1.8 2.5mg   6/28 2.0 Held  6/29 2.4    Goal of Therapy:  INR 2-3 Monitor platelets by anticoagulation protocol: Yes   Plan:   Continue to hold warfarin until surgical path is clear   CBC at least every 3 days per protocol   INRs daily while on antibiotics per protocol.   Dallie Piles, PharmD Clinical Pharmacist 09/12/2018 9:11 AM

## 2018-09-12 NOTE — Progress Notes (Signed)
Patient ID: Tina Barron, female   DOB: 08/01/1932, 85 y.o.   MRN: [id number]  [facility] PROGRESS NOTE  Tina PartGolda S Haskew [id number] DOB: 12/11/1932 DOA: 09/06/2018 PCP: Patrice ParadiseMcLaughlin, Miriam K, MD  HPI/Subjective: Patient is still reporting right upper quadrant abdominal pain radiating to the left lower quadrant and becoming diffuse pain.  For HIDA scan today had colonoscopy 09/10/2018 which is normal for colonoscopy.  Coumadn on hold  Objective: Vitals:   09/11/18 2123 09/12/18 0436  BP: 130/61 (!) 100/51  Pulse: 68 65  Resp: 16 16  Temp: 98.3 F (36.8 C) 98.6 F (37 C)  SpO2: 100% 96%    Intake/Output Summary (Last 24 hours) at 09/12/2018 1355 Last data filed at 09/12/2018 1353 Gross per 24 hour  Intake 957.87 ml  Output 0 ml  Net 957.87 ml   Filed Weights   09/06/18 1035 09/06/18 2030 09/10/18 1010  Weight: 56.7 kg 58.7 kg 58.7 kg    ROS: Review of Systems  Constitutional: Negative for chills and fever.  Eyes: Negative for blurred vision.  Respiratory: Negative for cough and shortness of breath.   Cardiovascular: Negative for chest pain.  Gastrointestinal: Positive for abdominal pain. Negative for constipation, diarrhea, nausea and vomiting.  Genitourinary: Negative for dysuria.  Musculoskeletal: Negative for joint pain.  Neurological: Negative for dizziness and headaches.   Exam: Physical Exam  Constitutional: She is oriented to person, place, and time.  HENT:  Nose: No mucosal edema.  Mouth/Throat: No oropharyngeal exudate or posterior oropharyngeal edema.  Eyes: Pupils are equal, round, and reactive to light. Conjunctivae, EOM and lids are normal.  Neck: No JVD present. Carotid bruit is not present. No edema present. No thyroid mass and no thyromegaly present.  Cardiovascular: S1 normal and S2 normal. Exam reveals no gallop.  No murmur heard. Pulses:      Dorsalis pedis pulses are 2+ on the right side and 2+ on the left side.  Respiratory: No respiratory  distress. She has no wheezes. She has no rhonchi. She has no rales.  GI: Soft. Bowel sounds are normal. There is abdominal tenderness.  Musculoskeletal:     Right ankle: She exhibits no swelling.     Left ankle: She exhibits no swelling.  Lymphadenopathy:    She has no cervical adenopathy.  Neurological: She is alert and oriented to person, place, and time. No cranial nerve deficit.  Skin: Skin is warm. No rash noted. Nails show no clubbing.  Psychiatric: She has a normal mood and affect.      Data Reviewed: Basic Metabolic Panel: Recent Labs  Lab 09/06/18 1055 09/07/18 0514 09/08/18 0416 09/09/18 0442 09/10/18 0445 09/12/18 0444  NA 139 137 139  --   --  143  K 4.2 4.5 4.1  --   --  4.2  CL 109 108 110  --   --  113*  CO2 22 23 20*  --   --  26  GLUCOSE 93 82 92  --   --  92  BUN 29* 22 16  --   --  12  CREATININE 1.42* 1.12* 1.44* 1.35* 1.19* 1.17*  CALCIUM 9.2 8.7* 8.7*  --   --  8.6*   Liver Function Tests: Recent Labs  Lab 09/06/18 1055 09/12/18 0444  AST 19 15  ALT 11 8  ALKPHOS 50 41  BILITOT 0.7 0.6  PROT 7.2 5.7*  ALBUMIN 3.8 3.0*   CBC: Recent Labs  Lab 09/06/18 1055  09/07/18 0514 09/08/18 0416 09/09/18  [id number] 09/10/18 0445 09/10/18 1547 09/11/18 0545 09/12/18 0444  WBC 5.2  --  3.6* 4.2  --   --  3.1* 3.3* 3.6*  NEUTROABS 3.9  --   --   --   --   --   --   --   --   HGB 9.3*   < > 9.0* 9.1* 8.3* 8.0* 8.5* 7.8* 7.6*  HCT 29.2*  --  28.1* 28.6*  --   --  26.6* 25.1* 24.0*  MCV 100.7*  --  99.3 99.0  --   --  100.8* 101.2* 100.8*  PLT 221  --  188 225  --   --  175 181 176   < > = values in this interval not displayed.    Recent Results (from the past 240 hour(s))  SARS Coronavirus 2 (CEPHEID - Performed in [facility] lab), Hosp Order     Status: None   Collection Time: 09/06/18 12:32 PM   Specimen: Nasopharyngeal Swab  Result Value Ref Range Status   SARS Coronavirus 2 NEGATIVE NEGATIVE Final    Comment: (NOTE) If result is  NEGATIVE SARS-CoV-2 target nucleic acids are NOT DETECTED. The SARS-CoV-2 RNA is generally detectable in upper and lower  respiratory specimens during the acute phase of infection. The lowest  concentration of SARS-CoV-2 viral copies this assay can detect is 250  copies / mL. A negative result does not preclude SARS-CoV-2 infection  and should not be used as the sole basis for treatment or other  patient management decisions.  A negative result may occur with  improper specimen collection / handling, submission of specimen other  than nasopharyngeal swab, presence of viral mutation(s) within the  areas targeted by this assay, and inadequate number of viral copies  (<250 copies / mL). A negative result must be combined with clinical  observations, patient history, and epidemiological information. If result is POSITIVE SARS-CoV-2 target nucleic acids are DETECTED. The SARS-CoV-2 RNA is generally detectable in upper and lower  respiratory specimens dur ing the acute phase of infection.  Positive  results are indicative of active infection with SARS-CoV-2.  Clinical  correlation with patient history and other diagnostic information is  necessary to determine patient infection status.  Positive results do  not rule out bacterial infection or co-infection with other viruses. If result is PRESUMPTIVE POSTIVE SARS-CoV-2 nucleic acids MAY BE PRESENT.   A presumptive positive result was obtained on the submitted specimen  and confirmed on repeat testing.  While 2019 novel coronavirus  (SARS-CoV-2) nucleic acids may be present in the submitted sample  additional confirmatory testing may be necessary for epidemiological  and / or clinical management purposes  to differentiate between  SARS-CoV-2 and other Sarbecovirus currently known to infect humans.  If clinically indicated additional testing with an alternate test  methodology [phone] is advised. The SARS-CoV-2 RNA is generally  detectable  in upper and lower respiratory sp ecimens during the acute  phase of infection. The expected result is Negative. Fact Sheet for Patients:  BoilerBrush.com.cyhttps://www.fda.gov/media/136312/download Fact Sheet for Healthcare Providers: https://pope.com/https://www.fda.gov/media/136313/download This test is not yet approved or cleared by the [place] FDA and has been authorized for detection and/or diagnosis of SARS-CoV-2 by FDA under an Emergency Use Authorization (EUA).  This EUA will remain in effect (meaning this test can be used) for the duration of the COVID-19 declaration under Section 564(b)(1) of the Act, 21 U.S.C. section 360bbb-3(b)(1), unless the authorization is terminated or revoked sooner. Performed at [facility], [address] [place], [place] [address]      Studies: Nm Hepato W/eject Fract  Result Date: 09/12/2018 CLINICAL DATA:  Right upper quadrant pain. EXAM: NUCLEAR MEDICINE HEPATOBILIARY IMAGING WITH GALLBLADDER EF TECHNIQUE: Sequential images of the abdomen were obtained out to 60 minutes following intravenous administration of radiopharmaceutical. After oral ingestion of Ensure, gallbladder ejection fraction was determined. At 60 min, normal ejection fraction is greater than 33%. RADIOPHARMACEUTICALS:  5.439 mCi Tc-82m  Choletec IV COMPARISON:  Right upper quadrant ultrasound dated September 06, 2018. FINDINGS: Prompt uptake and biliary excretion of activity by the liver is seen. Gallbladder activity is visualized, consistent with patency of cystic duct. Biliary activity passes into small bowel, consistent with patent common bile duct. Calculated gallbladder ejection fraction is 99%. (Normal gallbladder ejection fraction with Ensure is greater than 33%.) IMPRESSION: Normal hepatobiliary scan. Electronically Signed   By: Titus Dubin M.D.   On: 09/12/2018 13:25    Scheduled Meds: . dronedarone  400 mg Oral BID WC  . metoprolol succinate  12.5 mg Oral QHS  . metoprolol succinate  25 mg  Oral Daily  . multivitamin with minerals  1 tablet Oral Daily  . nystatin  5 mL Oral QID  . pantoprazole (PROTONIX) IV  40 mg Intravenous Q12H  . Warfarin - Pharmacist Dosing Inpatient   Does not apply q1800   Continuous Infusions: . sodium chloride 10 mL/hr at 09/12/18 1353  . piperacillin-tazobactam (ZOSYN)  IV Stopped (09/12/18 8101)    Assessment/Plan:  1. Upper GI bleed with epigastric abdominal pain.  Patient received 1 dose of vitamin K upon presentation,  on Protonix.  Endoscopy done and reported as negative.  colonoscopy 09/10/2018 has revealed diverticulosis but no diverticulitis no active bleeding.Marland Kitchen  He is recommending outpatient follow-up with Dr. Alice Reichert for possible outpatient capsule study a week hemoglobin has come down to 8.3.--8.0 -7.8 -7.6.  Has another episode of black stool as reported by the nurse will check hemoglobin, continue to watch for need for transfusion. 2. Acute right upper quadrant pain-CT scan with cholelithiasis.  pending HIDA scan and repeat LFTs.  Surgery consult placed discussed with Dr. Hampton Abbot 3. Coagulopathy with high INR.  Vitamin K given on presentation.  INR at 2.4  Will hold Coumadin tonight for possible surgical interventions 4.  atrial fibrillation on metoprolol and antiarrhythmic.  Holding Coumadin at this time   Risk of stroke higher off anticoagulation. 5. Suspected diverticulitis.  Switched antibiotics to Zosyn.  Cipro can cause arrhythmia with her antiarrhythmic.  Patient does not tolerate Flagyl well.  Colonoscopy with diverticulosis but no diverticulitis 6. Acute kidney injury.  Restart IV fluids.  Hold losartan.  Renal function is improving creatinine trended down to 1.19-1.17  Code Status:     Code Status Orders  (From admission, onward)         Start     Ordered   09/06/18 1321  Full code  Continuous     09/06/18 1321        Code Status History    This patient has a current code status but no historical code status.   Advance  Care Planning Activity     Family Communication: Call placed to the daughter to call back  disposition Plan: To be determined  Consultants:  Gastroenterology  Antibiotics:  Zosyn  Time spent:33 minutes  Illene Silver Malak Duchesneau  [employer]

## 2018-09-12 NOTE — Progress Notes (Signed)
Spoke with patient's daughter, Stanton Kidney, to update on plan of care and how patient is doing.  Appreciation expressed and understanding verbalized.

## 2018-09-12 NOTE — Progress Notes (Signed)
GI Inpatient Sign-On Note  Subjective:  Patient seen and examined this afternoon resting comfortably in hospital bed. She has had EGD and colonoscopy, both of which were negative for acute or occult GI bleeding. She had one episode of melena this afternoon. She does endorse diffuse abdominal pain, located in RUQ and LLQ. She reports pain is similar to what it was last week with no worsening. She denies nausea, vomiting, or fever. Hemoglobin is 7.6. Surgery has seen patient and had HIDA scan showing no evidence of cholecystitis with normal ejection fraction. She remains on Zosyn for suspected diverticulitis.   Scheduled Inpatient Medications:  . dronedarone  400 mg Oral BID WC  . metoprolol succinate  12.5 mg Oral QHS  . metoprolol succinate  25 mg Oral Daily  . multivitamin with minerals  1 tablet Oral Daily  . nystatin  5 mL Oral QID  . pantoprazole (PROTONIX) IV  40 mg Intravenous Q12H    Continuous Inpatient Infusions:   . sodium chloride 10 mL/hr at 09/12/18 1353  . piperacillin-tazobactam (ZOSYN)  IV 3.375 g (09/12/18 1359)    PRN Inpatient Medications:  sodium chloride, acetaminophen **OR** acetaminophen, diphenhydrAMINE **OR** diphenhydrAMINE, oxyCODONE  Review of Systems: Constitutional: Weight is stable.  Eyes: No changes in vision. ENT: No oral lesions, sore throat.  GI: see HPI.  Heme/Lymph: No easy bruising.  CV: No chest pain.  GU: No hematuria.  Integumentary: No rashes.  Neuro: No headaches.  Psych: No depression/anxiety.  Endocrine: No heat/cold intolerance.  Allergic/Immunologic: No urticaria.  Resp: No cough, SOB.  Musculoskeletal: No joint swelling.    Physical Examination: BP (!) 100/51 (BP Location: Right Arm)   Pulse 65   Temp 98.6 F (37 C) (Oral)   Resp 16   Ht 5\' 7"  (1.702 m)   Wt 58.7 kg   SpO2 96%   BMI 20.28 kg/m  Gen: NAD, alert and oriented x 4 HEENT: PEERLA, EOMI, Neck: supple, no JVD or thyromegaly Chest: CTA bilaterally, no  wheezes, crackles, or other adventitious sounds CV: RRR, no m/g/c/r Abd: soft, mild distention, mild TTP in RUQ and LLQ, +BS in all four quadrants; no HSM, guarding, ridigity, or rebound tenderness Ext: no edema, well perfused with 2+ pulses, Skin: no rash or lesions noted Lymph: no LAD  Data: Lab Results  Component Value Date   WBC 3.6 (L) 09/12/2018   HGB 8.1 (L) 09/12/2018   HCT 26.1 (L) 09/12/2018   MCV 100.8 (H) 09/12/2018   PLT 176 09/12/2018   Recent Labs  Lab 09/11/18 0545 09/12/18 0444 09/12/18 1408  HGB 7.8* 7.6* 8.1*   Lab Results  Component Value Date   NA 143 09/12/2018   K 4.2 09/12/2018   CL 113 (H) 09/12/2018   CO2 26 09/12/2018   BUN 12 09/12/2018   CREATININE 1.17 (H) 09/12/2018   Lab Results  Component Value Date   ALT 8 09/12/2018   AST 15 09/12/2018   ALKPHOS 41 09/12/2018   BILITOT 0.6 09/12/2018   Recent Labs  Lab 09/06/18 1055  09/12/18 0444  APTT 58*  --   --   INR 6.4*   < > 2.4*   < > = values in this interval not displayed.   Assessment/Plan:  85 y/o Caucasian female with a PMH of paroxsymal atrial fibrillation on chronic anticoagulation with Warfarin, valvular heart disease s/p tricuspid valve replacement 2008, IBS, HTN, fibromyalgia, anxiety, and depression admitted for melena   1. Melena, acute blood loss anemia  -  EGD and colonoscopy with no evidence of active or occult GI bleeding - Patient still having melena - Advise capsule study to rule out small bowel bleeding. I discussed capsule study indications, details, and potential complications with patient such as bleeding, perforation, infection. She consents to proceed. - continue to monitor hemoglobin. Transfuse for Hgb <7.0. - Clear liquid diet tonight. NPO after midnight. - Plan for capsule endoscopy tomorrow morning - Further recommendations after study - Continue care per hospital team    Please call with questions or concerns.    Octavia Bruckner, PA-C [facility] [phone] (Cell)

## 2018-09-13 ENCOUNTER — Encounter
Admission: EM | Disposition: A | Discharge status: Home / Self Care | Payer: Self-pay | Source: Home / Self Care | Attending: Internal Medicine

## 2018-09-13 DIAGNOSIS — K922 Gastrointestinal hemorrhage, unspecified: Secondary | ICD-10-CM

## 2018-09-13 HISTORY — PX: GIVENS CAPSULE STUDY: SHX5432

## 2018-09-13 LAB — COMPREHENSIVE METABOLIC PANEL
ALT: 9 U/L (ref 0–44)
AST: 16 U/L (ref 15–41)
Albumin: 2.9 g/dL — ABNORMAL LOW (ref 3.5–5.0)
Alkaline Phosphatase: 36 U/L — ABNORMAL LOW (ref 38–126)
Anion gap: 4 — ABNORMAL LOW (ref 5–15)
BUN: 11 mg/dL (ref 8–23)
CO2: 26 mmol/L (ref 22–32)
Calcium: 8.4 mg/dL — ABNORMAL LOW (ref 8.9–10.3)
Chloride: 110 mmol/L (ref 98–111)
Creatinine, Ser: 1.16 mg/dL — ABNORMAL HIGH (ref 0.44–1.00)
GFR calc Af Amer: 50 mL/min — ABNORMAL LOW (ref >60)
GFR calc non Af Amer: 43 mL/min — ABNORMAL LOW (ref >60)
Glucose, Bld: 80 mg/dL (ref 70–99)
Potassium: 4 mmol/L (ref 3.5–5.1)
Sodium: 140 mmol/L (ref 135–145)
Total Bilirubin: 0.7 mg/dL (ref 0.3–1.2)
Total Protein: 5.7 g/dL — ABNORMAL LOW (ref 6.5–8.1)

## 2018-09-13 LAB — CBC
HCT: 23 % — ABNORMAL LOW (ref 36.0–46.0)
Hemoglobin: 7.5 g/dL — ABNORMAL LOW (ref 12.0–15.0)
MCH: 32.2 pg (ref 26.0–34.0)
MCHC: 32.6 g/dL (ref 30.0–36.0)
MCV: 98.7 fL (ref 80.0–100.0)
Platelets: 163 10*3/uL (ref 150–400)
RBC: 2.33 MIL/uL — ABNORMAL LOW (ref 3.87–5.11)
RDW: 15.2 % (ref 11.5–15.5)
WBC: 4.1 10*3/uL (ref 4.0–10.5)
nRBC: 0 % (ref 0.0–0.2)

## 2018-09-13 LAB — PROTIME-INR
INR: 2 — ABNORMAL HIGH (ref 0.8–1.2)
Prothrombin Time: 22.7 seconds — ABNORMAL HIGH (ref 11.4–15.2)

## 2018-09-13 LAB — HEMOGLOBIN AND HEMATOCRIT, BLOOD
HCT: 26.2 % — ABNORMAL LOW (ref 36.0–46.0)
Hemoglobin: 8.3 g/dL — ABNORMAL LOW (ref 12.0–15.0)

## 2018-09-13 SURGERY — IMAGING PROCEDURE, GI TRACT, INTRALUMINAL, VIA CAPSULE

## 2018-09-13 MED ORDER — ENSURE ENLIVE PO LIQD
237.0000 mL | Freq: Two times a day (BID) | ORAL | Status: DC
  Administered 2018-09-13 – 2018-09-16 (×3): 237 mL via ORAL

## 2018-09-13 MED ORDER — WARFARIN - PHARMACIST DOSING INPATIENT: Freq: Every day | Status: DC

## 2018-09-13 MED ORDER — WARFARIN SODIUM 2.5 MG PO TABS
2.5000 mg | ORAL_TABLET | Freq: Once | ORAL | Status: DC
  Filled 2018-09-13: qty 1

## 2018-09-13 NOTE — Progress Notes (Signed)
Initial Nutrition Assessment  DOCUMENTATION CODES:   Not applicable  INTERVENTION:   RD will order supplements once diet advanced  If unable to advance diet after capsule study, would recommend initiation of TPN  Pt is at high refeed risk  NUTRITION DIAGNOSIS:   Inadequate oral intake related to acute illness as evidenced by NPO status.  GOAL:   Patient will meet greater than or equal to 90% of their needs  MONITOR:   Diet advancement, Labs, Weight trends, Skin, I & O's  REASON FOR ASSESSMENT:   NPO/Clear Liquid Diet    ASSESSMENT:   85 y/o female with h/o AFib, HTN, DJD, IBS, hiatal hernia, s/p pacemaker admitted with melena and abdominal pain   -Pt s/p EGD 6/26; pt noted to have hiatal hernia  - Pt s/p colonoscopy 6/27; pt found to have diverticulosis -Pt s/p HIDA scan 6/29; wnl   RD working remotely.  Pt has been on NPO/clear liquid diet since admit; pt now without adequate nutrition for > 7 days. Pt NPO today for capsule study. If patient is unable to advance diet after capsule study, would recommend initiation of TPN. Pt is at high refeed risk; recommend monitor K, Mg and P labs daily until stable. Per chart, pt appears fairly weight stable pta.   Medications reviewed and include: MVI, protonix, zosyn   Labs reviewed: creat 1.16(H) Hgb 7.5(L), Hct 23.0(L)  Unable to complete Nutrition-Focused physical exam at this time.   Diet Order:   Diet Order            Diet NPO time specified  Diet effective midnight             EDUCATION NEEDS:   No education needs have been identified at this time  Skin:  Skin Assessment: Reviewed RN Assessment(ecchymosis)  Last BM:  6/30  Height:   Ht Readings from Last 1 Encounters:  09/10/18 5\' 7"  (6.834 m)    Weight:   Wt Readings from Last 1 Encounters:  09/10/18 58.7 kg    Ideal Body Weight:  61.3 kg  BMI:  Body mass index is 20.28 kg/m.  Estimated Nutritional Needs:   Kcal:   1400-1600kcal/day  Protein:  76-88g/day  Fluid:  1.5L/day  Koleen Distance MS, RD, LDN Pager #- [phone] Office#- [phone] After Hours Pager: [phone]

## 2018-09-13 NOTE — Progress Notes (Signed)
GI Inpatient Follow-up Note  Subjective:  Patient seen and examined this afternoon resting comfortably in hospital bed. She is currently having capsule endoscopy done. She swallowed the capsule pill this morning without difficulty. She reports she is having mashed potatoes and gravy for lunch. She endorses generalized weakness and fatigue. She reports she did not sleep well last night. Abdominal pain is the same as it was yesterday. She reports she had black BM last night and this morning which was mostly liquid with what looked like a jelly substance. There has been no bright red blood per rectum. She denies fever, nausea, vomiting. Patient reports she would like to avoid any blood transfusions if possible.   Hemoglobin is trending down. Hemoglobin 8.3 last night and this morning was 7.5  Scheduled Inpatient Medications:  . dronedarone  400 mg Oral BID WC  . metoprolol succinate  12.5 mg Oral QHS  . metoprolol succinate  25 mg Oral Daily  . multivitamin with minerals  1 tablet Oral Daily  . nystatin  5 mL Oral QID  . pantoprazole (PROTONIX) IV  40 mg Intravenous Q12H    Continuous Inpatient Infusions:   . sodium chloride Stopped (09/12/18 2100)  . piperacillin-tazobactam (ZOSYN)  IV 12.5 mL/hr at 09/13/18 1201    PRN Inpatient Medications:  sodium chloride, acetaminophen **OR** acetaminophen, diphenhydrAMINE **OR** diphenhydrAMINE, oxyCODONE  Review of Systems: Constitutional: Weight is stable.  Eyes: No changes in vision. ENT: No oral lesions, sore throat.  GI: see HPI.  Heme/Lymph: No easy bruising.  CV: No chest pain.  GU: No hematuria.  Integumentary: No rashes.  Neuro: No headaches.  Psych: No depression/anxiety.  Endocrine: No heat/cold intolerance.  Allergic/Immunologic: No urticaria.  Resp: No cough, SOB.  Musculoskeletal: No joint swelling.    Physical Examination: BP 130/65   Pulse 73   Temp 97.6 F (36.4 C) (Oral)   Resp 16   Ht 5\' 7"  (1.702 m)   Wt 58.7  kg   SpO2 100%   BMI 20.28 kg/m  Gen: NAD, alert and oriented x 4 HEENT: PEERLA, EOMI, Neck: supple, no JVD or thyromegaly Chest: CTA bilaterally, no wheezes, crackles, or other adventitious sounds CV: RRR, no m/g/c/r Abd: soft, NT, ND, +BS in all four quadrants; no HSM, guarding, ridigity, or rebound tenderness Ext: no edema, well perfused with 2+ pulses, Skin: no rash or lesions noted Lymph: no LAD  Data: Lab Results  Component Value Date   WBC 4.1 09/13/2018   HGB 7.5 (L) 09/13/2018   HCT 23.0 (L) 09/13/2018   MCV 98.7 09/13/2018   PLT 163 09/13/2018   Recent Labs  Lab 09/12/18 1408 09/12/18 1909 09/13/18 0440  HGB 8.1* 8.3* 7.5*   Lab Results  Component Value Date   NA 140 09/13/2018   K 4.0 09/13/2018   CL 110 09/13/2018   CO2 26 09/13/2018   BUN 11 09/13/2018   CREATININE 1.16 (H) 09/13/2018   Lab Results  Component Value Date   ALT 9 09/13/2018   AST 16 09/13/2018   ALKPHOS 36 (L) 09/13/2018   BILITOT 0.7 09/13/2018   Recent Labs  Lab 09/13/18 0440  INR 2.0*   Assessment/Plan: 85 y/o Caucasian female with a PMH of paroxsymal atrial fibrillation on chronic anticoagulation with Warfarin,valvular heart disease s/p tricuspid valve replacement 2008, IBS, HTN, fibromyalgia, anxiety, and depressionadmitted for melena   1. Melena, acute blood loss anemia 2. LLQ, RUQ abd pain - HIDA with normal EF%, EGD negative  - Capsule endoscopy today  to rule out small bowel sources of bleeding (AVM, mass, ulcer, diverticulum, etc) - Continue soft diet as tolerated - Continue Zosyn - Continue to monitor  Hemoglobin. Transfuse for Hgb <7.0 - Agree with dietary recommendations  - Surgery has been consulted and advised no surgical interventions  - If abdominal pain continues to be persistent, consider repeat imaging  - Further recommendations after capsule study    Please call with questions or concerns.    Octavia Bruckner, PA-C [facility] [phone] (Cell)

## 2018-09-13 NOTE — Consult Note (Addendum)
[place] for Warfarin  Indication: atrial fibrillation  Patient Measurements: Height: 5\' 7"  (170.2 cm) Weight: 129 lb 8 oz (58.7 kg) IBW/kg (Calculated) : 61.6  Vital Signs: Temp: 98.7 F (37.1 C) (06/30 0616) Temp Source: Oral (06/30 0616) BP: 123/56 (06/30 0616) Pulse Rate: 65 (06/30 0616)  Labs: Recent Labs    09/11/18 0545 09/12/18 0444 09/12/18 1408 09/12/18 1909 09/13/18 0440  HGB 7.8* 7.6* 8.1* 8.3* 7.5*  HCT 25.1* 24.0* 26.1* 25.8* 23.0*  PLT 181 176  --   --  163  LABPROT 22.8* 25.6*  --   --  22.7*  INR 2.0* 2.4*  --   --  2.0*  CREATININE  --  1.17*  --   --  1.16*    Estimated Creatinine Clearance: 33.5 mL/min (A) (by C-G formula based on SCr of 1.16 mg/dL (H)).   Medications:  -PTA warfarin 2.5 mg daily -Vitamin K PO 10 mg x 2 doses  -No anticoagulants for this admission -Dronedarone (PTA med) -no new DDIs since previous note  Assessment: Patient was admitted for GI bleed (melena) and anemia. Warfarin has been held since admitted. On admission, her INR was elevated. Of note, patient had been NPO for ~ 2 days for an EGD and colonoscopy - which could elevate the INR. Presently no active bleeding and INR is therapeutic at this time. Patient had a small drop in Hgb since yesterday.   Drug interactions:  -Dronedarone - may increase INR/risk of bleeding -Zosyn - may increase INR  Date  INR  Warfarin (mg)  6/23 6.4 Held (vitamin K)  6/24 5.2  Held (vitamin K)  6/25 2.7 Held  6/26 1.8 Held   6/27 1.8 2.5mg   6/28 2.0 Held  6/29 2.4 Held  6/30 2.0 2.5mg    Goal of Therapy:  INR 2-3 Monitor platelets by anticoagulation protocol: Yes   Plan:   Will re-initiate warfarin at 2.5mg  x 1 dose   CBC at least every 3 days per protocol   INRs daily while on antibiotics per protocol.   Pearla Dubonnet, PharmD Clinical Pharmacist 09/13/2018 4:33 PM

## 2018-09-13 NOTE — Progress Notes (Addendum)
Patient ID: Tina Barron, female   DOB: October 12, 1932, 85 y.o.   MRN: [id number]  [employer] PROGRESS NOTE  Tina Barron ASN:[id number] DOB: 1933/03/01 DOA: 09/06/2018 PCP: Marinda Elk, MD  HPI/Subjective: Patient is feeling okay but extremely weak as she did not eat any food for almost a week.  Capsule study today  colonoscopy 09/10/2018 which is normal for colonoscopy.  Coumadn on hold  Objective: Vitals:   09/13/18 0616 09/13/18 1202  BP: (!) 123/56 130/65  Pulse: 65 73  Resp: 17 16  Temp: 98.7 F (37.1 C) 97.6 F (36.4 C)  SpO2: 98% 100%    Intake/Output Summary (Last 24 hours) at 09/13/2018 1623 Last data filed at 09/13/2018 1416 Gross per 24 hour  Intake 460.33 ml  Output 2 ml  Net 458.33 ml   Filed Weights   09/06/18 1035 09/06/18 2030 09/10/18 1010  Weight: 56.7 kg 58.7 kg 58.7 kg    ROS: Review of Systems  Constitutional: Negative for chills and fever.  Eyes: Negative for blurred vision.  Respiratory: Negative for cough and shortness of breath.   Cardiovascular: Negative for chest pain.  Gastrointestinal: Positive for abdominal pain. Negative for constipation, diarrhea, nausea and vomiting.  Genitourinary: Negative for dysuria.  Musculoskeletal: Negative for joint pain.  Neurological: Negative for dizziness and headaches.   Exam: Physical Exam  Constitutional: She is oriented to person, place, and time.  HENT:  Nose: No mucosal edema.  Mouth/Throat: No oropharyngeal exudate or posterior oropharyngeal edema.  Eyes: Pupils are equal, round, and reactive to light. Conjunctivae, EOM and lids are normal.  Neck: No JVD present. Carotid bruit is not present. No edema present. No thyroid mass and no thyromegaly present.  Cardiovascular: S1 normal and S2 normal. Exam reveals no gallop.  No murmur heard. Pulses:      Dorsalis pedis pulses are 2+ on the right side and 2+ on the left side.  Respiratory: No respiratory distress. She has no wheezes. She has  no rhonchi. She has no rales.  GI: Soft. Bowel sounds are normal. There is abdominal tenderness.  Musculoskeletal:     Right ankle: She exhibits no swelling.     Left ankle: She exhibits no swelling.  Lymphadenopathy:    She has no cervical adenopathy.  Neurological: She is alert and oriented to person, place, and time. No cranial nerve deficit.  Skin: Skin is warm. No rash noted. Nails show no clubbing.  Psychiatric: She has a normal mood and affect.      Data Reviewed: Basic Metabolic Panel: Recent Labs  Lab 09/07/18 0514 09/08/18 0416 09/09/18 0442 09/10/18 0445 09/12/18 0444 09/13/18 0440  NA 137 139  --   --  143 140  K 4.5 4.1  --   --  4.2 4.0  CL 108 110  --   --  113* 110  CO2 23 20*  --   --  26 26  GLUCOSE 82 92  --   --  92 80  BUN 22 16  --   --  12 11  CREATININE 1.12* 1.44* 1.35* 1.19* 1.17* 1.16*  CALCIUM 8.7* 8.7*  --   --  8.6* 8.4*   Liver Function Tests: Recent Labs  Lab 09/12/18 0444 09/13/18 0440  AST 15 16  ALT 8 9  ALKPHOS 41 36*  BILITOT 0.6 0.7  PROT 5.7* 5.7*  ALBUMIN 3.0* 2.9*   CBC: Recent Labs  Lab 09/08/18 0416  09/10/18 1547 09/11/18 0545 09/12/18 0444 09/12/18  1408 09/12/18 1909 09/13/18 0440  WBC 4.2  --  3.1* 3.3* 3.6*  --   --  4.1  HGB 9.1*   < > 8.5* 7.8* 7.6* 8.1* 8.3* 7.5*  HCT 28.6*  --  26.6* 25.1* 24.0* 26.1* 25.8* 23.0*  MCV 99.0  --  100.8* 101.2* 100.8*  --   --  98.7  PLT 225  --  175 181 176  --   --  163   < > = values in this interval not displayed.    Recent Results (from the past 240 hour(s))  SARS Coronavirus 2 (CEPHEID - [facility]), Hosp Order     Status: None   Collection Time: 09/06/18 12:32 PM   Specimen: Nasopharyngeal Swab  Result Value Ref Range Status   SARS Coronavirus 2 NEGATIVE NEGATIVE Final    Comment: (NOTE) If result is NEGATIVE SARS-CoV-2 target nucleic acids are NOT DETECTED. The SARS-CoV-2 RNA is generally detectable in upper and lower   respiratory specimens during the acute phase of infection. The lowest  concentration of SARS-CoV-2 viral copies this assay can detect is 250  copies / mL. A negative result does not preclude SARS-CoV-2 infection  and should not be used as the sole basis for treatment or other  patient management decisions.  A negative result may occur with  improper specimen collection / handling, submission of specimen other  than nasopharyngeal swab, presence of viral mutation(s) within the  areas targeted by this assay, and inadequate number of viral copies  (<250 copies / mL). A negative result must be combined with clinical  observations, patient history, and epidemiological information. If result is POSITIVE SARS-CoV-2 target nucleic acids are DETECTED. The SARS-CoV-2 RNA is generally detectable in upper and lower  respiratory specimens dur ing the acute phase of infection.  Positive  results are indicative of active infection with SARS-CoV-2.  Clinical  correlation with patient history and other diagnostic information is  necessary to determine patient infection status.  Positive results do  not rule out bacterial infection or co-infection with other viruses. If result is PRESUMPTIVE POSTIVE SARS-CoV-2 nucleic acids MAY BE PRESENT.   A presumptive positive result was obtained on the submitted specimen  and confirmed on repeat testing.  While 2019 novel coronavirus  (SARS-CoV-2) nucleic acids may be present in the submitted sample  additional confirmatory testing may be necessary for epidemiological  and / or clinical management purposes  to differentiate between  SARS-CoV-2 and other Sarbecovirus currently known to infect humans.  If clinically indicated additional testing with an alternate test  methodology [phone]([id number]) is advised. The SARS-CoV-2 RNA is generally  detectable in upper and lower respiratory sp ecimens during the acute  phase of infection. The expected result is Negative. Fact  Sheet for Patients:  BoilerBrush.com.cyhttps://www.fda.gov/media/136312/download Fact Sheet for Healthcare Providers: https://pope.com/https://www.fda.gov/media/136313/download This test is not yet approved or cleared by the [place] FDA and has been authorized for detection and/or diagnosis of SARS-CoV-2 by FDA under an Emergency Use Authorization (EUA).  This EUA will remain in effect (meaning this test can be used) for the duration of the COVID-19 declaration under Section 564(b)(1) of the Act, 21 U.S.C. section 360bbb-3(b)(1), unless the authorization is terminated or revoked sooner. Performed at [facility], [address] [place], [place] [address]      Studies: Nm Hepato W/eject Fract  Result Date: 09/12/2018 CLINICAL DATA:  Right upper quadrant pain. EXAM: NUCLEAR MEDICINE HEPATOBILIARY IMAGING WITH GALLBLADDER EF TECHNIQUE: Sequential  images of the abdomen were obtained out to 60 minutes following intravenous administration of radiopharmaceutical. After oral ingestion of Ensure, gallbladder ejection fraction was determined. At 60 min, normal ejection fraction is greater than 33%. RADIOPHARMACEUTICALS:  5.439 mCi Tc-685m  Choletec IV COMPARISON:  Right upper quadrant ultrasound dated September 06, 2018. FINDINGS: Prompt uptake and biliary excretion of activity by the liver is seen. Gallbladder activity is visualized, consistent with patency of cystic duct. Biliary activity passes into small bowel, consistent with patent common bile duct. Calculated gallbladder ejection fraction is 99%. (Normal gallbladder ejection fraction with Ensure is greater than 33%.) IMPRESSION: Normal hepatobiliary scan. Electronically Signed   By: Obie DredgeWilliam T Derry M.D.   On: 09/12/2018 13:25    Scheduled Meds: . dronedarone  400 mg Oral BID WC  . feeding supplement (ENSURE ENLIVE)  237 mL Oral BID BM  . metoprolol succinate  12.5 mg Oral QHS  . metoprolol succinate  25 mg Oral Daily  . multivitamin with minerals  1 tablet Oral Daily   . nystatin  5 mL Oral QID  . pantoprazole (PROTONIX) IV  40 mg Intravenous Q12H   Continuous Infusions: . sodium chloride Stopped (09/12/18 2100)  . piperacillin-tazobactam (ZOSYN)  IV 12.5 mL/hr at 09/13/18 1201    Assessment/Plan:  1. Upper GI bleed with epigastric abdominal pain.  Patient received 1 dose of vitamin K upon presentation,  on Protonix.  Endoscopy done and reported as negative.  colonoscopy 09/10/2018 has revealed diverticulosis but no diverticulitis no active bleeding..   scheduled for capsule study.  Out patient follow-up with Dr. Norma Fredricksonoledo after discharge hemoglobin has come down to 8.3.--8.0 -7.8 -7.6.-8.3-7.5 had another episode of black stool on 09/11/2018 continue to watch for need for transfusion and monitor hemoglobin.  First 2. Acute right upper quadrant pain-CT scan with cholelithiasis.  Normal HIDA scan and repeat LFTs.  Surgery consult placed discussed with Dr. Aleen CampiPiscoya.  No surgical interventions are needed at this time 3. Coagulopathy with high INR.  Vitamin K given on presentation.  INR at 2.0  Will resume Coumadin 4.  atrial fibrillation on metoprolol and antiarrhythmic.  Resume Coumadin at this time   Risk of stroke higher off anticoagulation. 5. Suspected diverticulitis.  Switched antibiotics to Zosyn.  Cipro can cause arrhythmia with her antiarrhythmic.  Patient does not tolerate Flagyl well.  Colonoscopy with diverticulosis but no diverticulitis 6. Acute kidney injury.  Restart IV fluids.  Hold losartan.  Renal function is improving creatinine trended down to 1.19-1.17--1.16 7. 7 PT consult for deconditioning  Code Status:     Code Status Orders  (From admission, onward)         Start     Ordered   09/06/18 1321  Full code  Continuous     09/06/18 1321        Code Status History    This patient has a current code status but no historical code status.   Advance Care Planning Activity     Family Communication: Call placed to the daughter to call  back  disposition Plan: To be determined  Consultants:  Gastroenterology  Antibiotics:  Zosyn  Time spent:33 minutes  Deanna ArtisAruna Catharina Pica  [employer]

## 2018-09-14 ENCOUNTER — Encounter: Payer: Self-pay | Admitting: Internal Medicine

## 2018-09-14 LAB — CBC
HCT: 23.8 % — ABNORMAL LOW (ref 36.0–46.0)
Hemoglobin: 7.5 g/dL — ABNORMAL LOW (ref 12.0–15.0)
MCH: 32.1 pg (ref 26.0–34.0)
MCHC: 31.5 g/dL (ref 30.0–36.0)
MCV: 101.7 fL — ABNORMAL HIGH (ref 80.0–100.0)
Platelets: 176 10*3/uL (ref 150–400)
RBC: 2.34 MIL/uL — ABNORMAL LOW (ref 3.87–5.11)
RDW: 15.5 % (ref 11.5–15.5)
WBC: 5.4 10*3/uL (ref 4.0–10.5)
nRBC: 0 % (ref 0.0–0.2)

## 2018-09-14 LAB — HEMOGLOBIN AND HEMATOCRIT, BLOOD
HCT: 25.5 % — ABNORMAL LOW (ref 36.0–46.0)
Hemoglobin: 8.3 g/dL — ABNORMAL LOW (ref 12.0–15.0)

## 2018-09-14 LAB — HEPARIN LEVEL (UNFRACTIONATED): Heparin Unfractionated: 0.39 IU/mL (ref 0.30–0.70)

## 2018-09-14 LAB — PROTIME-INR
INR: 1.9 — ABNORMAL HIGH (ref 0.8–1.2)
Prothrombin Time: 21.2 seconds — ABNORMAL HIGH (ref 11.4–15.2)

## 2018-09-14 MED ORDER — METOPROLOL SUCCINATE ER 25 MG PO TB24
12.5000 mg | ORAL_TABLET | Freq: Every day | ORAL | Status: DC
  Administered 2018-09-14 – 2018-09-16 (×3): 12.5 mg via ORAL
  Filled 2018-09-14 (×3): qty 1

## 2018-09-14 MED ORDER — HEPARIN (PORCINE) 25000 UT/250ML-% IV SOLN
750.0000 [IU]/h | INTRAVENOUS | Status: AC
  Administered 2018-09-14: 850 [IU]/h via INTRAVENOUS
  Filled 2018-09-14: qty 250

## 2018-09-14 MED ORDER — ALUM & MAG HYDROXIDE-SIMETH 200-200-20 MG/5ML PO SUSP
30.0000 mL | Freq: Four times a day (QID) | ORAL | Status: DC | PRN
  Administered 2018-09-14: 30 mL via ORAL
  Filled 2018-09-14: qty 30

## 2018-09-14 NOTE — Evaluation (Signed)
Physical Therapy Evaluation Patient Details Name: Tina Barron MRN: [id number] DOB: [birth year] Today's Date: 09/14/2018   History of Present Illness  presented to ER secondary to black stools, decreased PO, progressive weakness; admitted for management of upper GIB (unknown source) and coagulopathy (INR 6.4, now corrected)  Clinical Impression  Upon re-evaluation, patient alert and oriented; follows commands and demonstrates good effort with all mobility tasks.  Bilat UE/LE strength and ROM grossly symmetrical and WFL; no focal weakness appreciated.  Able to complete sit/stand, basic transfers and gait (200') without assist device, close sup/mod indep. Mildly antalgic (history of L hip fracture/repair), but patient reports baseline for her.  No buckling, LOB or safety concern noted; good standing balance, good awareness of limits of stability and overall safety needs.  Voices comfort with existing HEP and denies need for additional therex at this time. Patient appears near baseline level of functional ability; no acute PT needs identified at this time.  Will complete order; please re-consult should needs change.    Follow Up Recommendations No PT follow up    Equipment Recommendations       Recommendations for Other Services       Precautions / Restrictions Precautions Precautions: None      Mobility  Bed Mobility               General bed mobility comments: seated in recliner beginning/end of treatment session  Transfers Overall transfer level: Modified independent Equipment used: None             General transfer comment: minimal UE support, good LE strength and power with movement transition  Ambulation/Gait Ambulation/Gait assistance: Supervision;Modified independent (Device/Increase time) Gait Distance (Feet): 200 Feet Assistive device: None       General Gait Details: mild drop to L LE in stance (history of previous L hip fracture/repair); reciprocal  stepping pattern, good trunk rotation and arm swing.  Steady cadence.  Reports gait performance at/near baseline for her.  Stairs            Wheelchair Mobility    Modified Rankin (Stroke Patients Only)       Balance Overall balance assessment: Modified Independent                                           Pertinent Vitals/Pain Pain Assessment: No/denies pain    Home Living Family/patient expects to be discharged to:: Private residence Living Arrangements: Alone Available Help at Discharge: Family   Home Access: Level entry     Home Layout: One level Home Equipment: [place] - single point;Walker - 2 wheels      Prior Function Level of Independence: Independent         Comments: Indep with ADLs, household and community mobilization without assist device; intermittent use of SPC with longer, community distances.  + driving; denies fall history     Hand Dominance        Extremity/Trunk Assessment   Upper Extremity Assessment Upper Extremity Assessment: Overall WFL for tasks assessed    Lower Extremity Assessment Lower Extremity Assessment: Overall WFL for tasks assessed       Communication   Communication: No difficulties  Cognition Arousal/Alertness: Awake/alert Behavior During Therapy: WFL for tasks assessed/performed Overall Cognitive Status: Within Functional Limits for tasks assessed  General Comments General comments (skin integrity, edema, etc.): able to move outside immediate BOS, reach to lower drawers in room without buckling or LOB; good awareness of limits of stability and overall safety needs    Exercises Other Exercises Other Exercises: Verbally reviewed seated and standing LE therex for use as HEP; patient indep verbalizes personal routine (has HEP from previous therapy encounters).  Denies need for additional therex/HEP paperwork at this point.   Assessment/Plan     PT Assessment Patent does not need any further PT services  PT Problem List         PT Treatment Interventions      PT Goals (Current goals can be found in the Care Plan section)  Acute Rehab PT Goals Patient Stated Goal: to get back home PT Goal Formulation: All assessment and education complete, DC therapy Time For Goal Achievement: 09/14/18 Potential to Achieve Goals: Good    Frequency     Barriers to discharge        Co-evaluation               AM-PAC PT "6 Clicks" Mobility  Outcome Measure Help needed turning from your back to your side while in a flat bed without using bedrails?: None Help needed moving from lying on your back to sitting on the side of a flat bed without using bedrails?: None Help needed moving to and from a bed to a chair (including a wheelchair)?: None Help needed standing up from a chair using your arms (e.g., wheelchair or bedside chair)?: None Help needed to walk in hospital room?: None Help needed climbing 3-5 steps with a railing? : None 6 Click Score: 24    End of Session Equipment Utilized During Treatment: Gait belt Activity Tolerance: Patient tolerated treatment well Patient left: in chair;with call bell/phone within reach(alarm not required)   PT Visit Diagnosis: Muscle weakness (generalized) (M62.81);Difficulty in walking, not elsewhere classified (R26.2)    Time: 6045-40981136-1147 PT Time Calculation (min) (ACUTE ONLY): 11 min   Charges:   PT Evaluation $PT Re-evaluation: 1 Re-eval          Cliff Damiani H. Manson PasseyBrown, PT, DPT, NCS 09/14/18, 9:55 PM [phone]

## 2018-09-14 NOTE — H&P (View-Only) (Signed)
[facility]  Subjective: Patient seen for f/u obscure overt GI bleeding. SBCE was read today showing active bleeding in the proximal and mid-jejunum, presumably from small bowel angioectasias.  Patient denies further melena. Has mild lower quadrant abdominal pain.   Objective: Vital signs in last 24 hours: Temp:  [97.9 F (36.6 C)-98.2 F (36.8 C)] 98.2 F (36.8 C) (07/01 0519) Pulse Rate:  [64-72] 64 (07/01 0519) Resp:  [17-20] 17 (07/01 0519) BP: (115-146)/(50-60) 115/50 (07/01 0519) SpO2:  [95 %-100 %] 95 % (07/01 0519) Blood pressure (!) 115/50, pulse 64, temperature 98.2 F (36.8 C), temperature source Oral, resp. rate 17, height 5\' 7"  (1.702 m), weight 58.7 kg, SpO2 95 %.    Intake/Output from previous day: 06/30 0701 - 07/01 0700 In: 277.4 [P.O.:240; IV Piggyback:37.4] Out: -   Intake/Output this shift: Total I/O In: 301.1 [P.O.:240; IV Piggyback:61.1] Out: -    General appearance:  Alert, NAD Resp:  CTA Cardio:  RRR GI:  Soft, mildly tender in lower abdomen. BS+ Extremities: No edema.   Lab Results: Results for orders placed or performed during the hospital encounter of 09/06/18 (from the past 24 hour(s))  Hemoglobin and hematocrit, blood     Status: Abnormal   Collection Time: 09/13/18  8:31 PM  Result Value Ref Range   Hemoglobin 8.3 (L) 12.0 - 15.0 g/dL   HCT 16.126.2 (L) 09.636.0 - 04.546.0 %  Protime-INR     Status: Abnormal   Collection Time: 09/14/18  3:42 AM  Result Value Ref Range   Prothrombin Time 21.2 (H) 11.4 - 15.2 seconds   INR 1.9 (H) 0.8 - 1.2  CBC     Status: Abnormal   Collection Time: 09/14/18  3:42 AM  Result Value Ref Range   WBC 5.4 4.0 - 10.5 K/uL   RBC 2.34 (L) 3.87 - 5.11 MIL/uL   Hemoglobin 7.5 (L) 12.0 - 15.0 g/dL   HCT 40.923.8 (L) 81.136.0 - 91.446.0 %   MCV 101.7 (H) 80.0 - 100.0 fL   MCH 32.1 26.0 - 34.0 pg   MCHC 31.5 30.0 - 36.0 g/dL   RDW 78.215.5 95.611.5 - 21.315.5 %   Platelets 176 150 - 400 K/uL   nRBC  0.0 0.0 - 0.2 %     Recent Labs    09/12/18 0444  09/13/18 0440 09/13/18 2031 09/14/18 0342  WBC 3.6*  --  4.1  --  5.4  HGB 7.6*   < > 7.5* 8.3* 7.5*  HCT 24.0*   < > 23.0* 26.2* 23.8*  PLT 176  --  163  --  176   < > = values in this interval not displayed.   BMET Recent Labs    09/12/18 0444 09/13/18 0440  NA 143 140  K 4.2 4.0  CL 113* 110  CO2 26 26  GLUCOSE 92 80  BUN 12 11  CREATININE 1.17* 1.16*  CALCIUM 8.6* 8.4*   LFT Recent Labs    09/13/18 0440  PROT 5.7*  ALBUMIN 2.9*  AST 16  ALT 9  ALKPHOS 36*  BILITOT 0.7   PT/INR Recent Labs    09/13/18 0440 09/14/18 0342  LABPROT 22.7* 21.2*  INR 2.0* 1.9*   Hepatitis Panel No results for input(s): HEPBSAG, HCVAB, HEPAIGM, HEPBIGM in the last 72 hours. C-Diff No results for input(s): CDIFFTOX in the last 72 hours. No results for input(s): CDIFFPCR in the last 72 hours.   Studies/Results: No results found.  Scheduled Inpatient  Medications:   . dronedarone  400 mg Oral BID WC  . feeding supplement (ENSURE ENLIVE)  237 mL Oral BID BM  . metoprolol succinate  12.5 mg Oral Daily  . multivitamin with minerals  1 tablet Oral Daily  . nystatin  5 mL Oral QID  . pantoprazole (PROTONIX) IV  40 mg Intravenous Q12H    Continuous Inpatient Infusions:   . sodium chloride Stopped (09/12/18 2100)  . heparin      PRN Inpatient Medications:  sodium chloride, acetaminophen **OR** acetaminophen, alum & mag hydroxide-simeth, diphenhydrAMINE **OR** diphenhydrAMINE, oxyCODONE  Miscellaneous: N/A  Assessment:  1. Small bowel bleeding - likely from AVM's.   Plan:  1. Proceed with SBE with hemostasis after FFP and control of INR down to less than 1.5. IV heparin has been advised and this is being started by primary team to help prevent clotting of heart valve replacement. The patient understands the nature of the planned procedure, indications, risks, alternatives and potential complications including but not  limited to bleeding, infection, perforation, damage to internal organs and possible oversedation/side effects from anesthesia. The patient agrees and gives consent to proceed.  Please refer to procedure notes for findings, recommendations and patient disposition/instructions. 2. NPO after MN.  Tina Barron K. Alice Reichert, M.D. 09/14/2018, 1:50 PM

## 2018-09-14 NOTE — Progress Notes (Signed)
Called and updated patient's niece, Lattie Haw, at patient's request (daughter is not available today).  All questions were answered; understanding was verbalized and Lattie Haw expressed appreciation for the call.

## 2018-09-14 NOTE — Consult Note (Signed)
[place] for heparin drip management Indication: atrial fibrillation  Patient Measurements: Height: 5\' 7"  (170.2 cm) Weight: 129 lb 8 oz (58.7 kg) IBW/kg (Calculated) : 61.6  Vital Signs: Temp: 98.2 F (36.8 C) (07/01 0519) Temp Source: Oral (07/01 0519) BP: 115/50 (07/01 0519) Pulse Rate: 64 (07/01 0519)  Labs: Recent Labs    09/12/18 0444  09/13/18 0440 09/13/18 2031 09/14/18 0342  HGB 7.6*   < > 7.5* 8.3* 7.5*  HCT 24.0*   < > 23.0* 26.2* 23.8*  PLT 176  --  163  --  176  LABPROT 25.6*  --  22.7*  --  21.2*  INR 2.4*  --  2.0*  --  1.9*  CREATININE 1.17*  --  1.16*  --   --    < > = values in this interval not displayed.    Estimated Creatinine Clearance: 33.5 mL/min (A) (by C-G formula based on SCr of 1.16 mg/dL (H)).   Medical History: Past Medical History:  Diagnosis Date  . Anxiety and depression   . Atherosclerosis   . B12 deficiency   . Degenerative joint disease (DJD) of hip   . Fibromyalgia syndrome   . HH (hiatus hernia)   . Hypertension   . IBS (irritable bowel syndrome)   . Osteopenia   . Paroxysmal A-fib ([place])   . Sinoatrial node dysfunction (H[facility])    S/p pacemaker implant 04/17/08. Atrial lead revision 06/08/08    . 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 dronedarone  400 mg Oral BID WC  . feeding supplement (ENSURE ENLIVE)  237 mL Oral BID BM  . metoprolol succinate  12.5 mg Oral Daily  . multivitamin with minerals  1 tablet Oral Daily  . nystatin  5 mL Oral QID  . pantoprazole (PROTONIX) IV  40 mg Intravenous Q12H    Assessment: Patient was admitted for GI bleed (melena) and anemia. Warfarin has been held off and on since admission (last dose 6/27: 2.5 mg).  Endoscopy done and reported as negative, colonoscopy 09/10/2018 has revealed  diverticulosis but no diverticulitis & no active bleeding. A capsule study 6/30 revealed bleeding in the small intestine.  She had another episode of black stool on 09/11/2018. She is being started on a heparin drip without bolus which will be continued until tomorrow a.m.  NPO after midnight for enteroscopy.  Goal of Therapy:  Heparin level 0.3-0.7 units/ml Monitor platelets by anticoagulation protocol: Yes   Plan:   Start heparin infusion at 850 units/hr: stopping infusion at 0800  Check anti-Xa level in 8 hours and daily while on heparin  Continue to monitor H&H and platelets  Dallie Piles, PharmD 09/14/2018,12:58 PM

## 2018-09-14 NOTE — Progress Notes (Signed)
Patient ID: Tina Barron, female   DOB: 12/30/1932, 85 y.o.   MRN: [id number]  [employer] PROGRESS NOTE  Tina Barron WUJ:[id number] DOB: 11/07/1932 DOA: 09/06/2018 PCP: Patrice ParadiseMcLaughlin, Miriam K, MD  HPI/Subjective: Patient is endorsing  abd bloating .  Hemoglobin is 7.5 today colonoscopy 09/10/2018 which is normal for colonoscopy.  Coumadn on hold  Starting patient on heparin drip without bolus in the interim which will be discontinued at 8 AM tomorrow Objective: Vitals:   09/13/18 2112 09/14/18 0519  BP: (!) 146/60 (!) 115/50  Pulse: 72 64  Resp: 20 17  Temp: 97.9 F (36.6 C) 98.2 F (36.8 C)  SpO2: 100% 95%    Intake/Output Summary (Last 24 hours) at 09/14/2018 1239 Last data filed at 09/14/2018 0900 Gross per 24 hour  Intake 517.35 ml  Output -  Net 517.35 ml   Filed Weights   09/06/18 1035 09/06/18 2030 09/10/18 1010  Weight: 56.7 kg 58.7 kg 58.7 kg    ROS: Review of Systems  Constitutional: Negative for chills and fever.  Eyes: Negative for blurred vision.  Respiratory: Negative for cough and shortness of breath.   Cardiovascular: Negative for chest pain.  Gastrointestinal: Positive for abdominal pain. Negative for constipation, diarrhea, nausea and vomiting.  Genitourinary: Negative for dysuria.  Musculoskeletal: Negative for joint pain.  Neurological: Negative for dizziness and headaches.   Exam: Physical Exam  Constitutional: She is oriented to person, place, and time.  HENT:  Nose: No mucosal edema.  Mouth/Throat: No oropharyngeal exudate or posterior oropharyngeal edema.  Eyes: Pupils are equal, round, and reactive to light. Conjunctivae, EOM and lids are normal.  Neck: No JVD present. Carotid bruit is not present. No edema present. No thyroid mass and no thyromegaly present.  Cardiovascular: S1 normal and S2 normal. Exam reveals no gallop.  No murmur heard. Pulses:      Dorsalis pedis pulses are 2+ on the right side and 2+ on the left side.   Respiratory: No respiratory distress. She has no wheezes. She has no rhonchi. She has no rales.  GI: Soft. Bowel sounds are normal. She exhibits distension. There is abdominal tenderness.  Musculoskeletal:     Right ankle: She exhibits no swelling.     Left ankle: She exhibits no swelling.  Lymphadenopathy:    She has no cervical adenopathy.  Neurological: She is alert and oriented to person, place, and time. No cranial nerve deficit.  Skin: Skin is warm. No rash noted. Nails show no clubbing.  Psychiatric: She has a normal mood and affect.      Data Reviewed: Basic Metabolic Panel: Recent Labs  Lab 09/08/18 0416 09/09/18 0442 09/10/18 0445 09/12/18 0444 09/13/18 0440  NA 139  --   --  143 140  K 4.1  --   --  4.2 4.0  CL 110  --   --  113* 110  CO2 20*  --   --  26 26  GLUCOSE 92  --   --  92 80  BUN 16  --   --  12 11  CREATININE 1.44* 1.35* 1.19* 1.17* 1.16*  CALCIUM 8.7*  --   --  8.6* 8.4*   Liver Function Tests: Recent Labs  Lab 09/12/18 0444 09/13/18 0440  AST 15 16  ALT 8 9  ALKPHOS 41 36*  BILITOT 0.6 0.7  PROT 5.7* 5.7*  ALBUMIN 3.0* 2.9*   CBC: Recent Labs  Lab 09/10/18 1547 09/11/18 0545 09/12/18 0444 09/12/18 1408 09/12/18 1909  09/13/18 0440 09/13/18 2031 09/14/18 0342  WBC 3.1* 3.3* 3.6*  --   --  4.1  --  5.4  HGB 8.5* 7.8* 7.6* 8.1* 8.3* 7.5* 8.3* 7.5*  HCT 26.6* 25.1* 24.0* 26.1* 25.8* 23.0* 26.2* 23.8*  MCV 100.8* 101.2* 100.8*  --   --  98.7  --  101.7*  PLT 175 181 176  --   --  163  --  176    Recent Results (from the past 240 hour(s))  SARS Coronavirus 2 (CEPHEID - Performed in [place] hospital lab), Hosp Order     Status: None   Collection Time: 09/06/18 12:32 PM   Specimen: Nasopharyngeal Swab  Result Value Ref Range Status   SARS Coronavirus 2 NEGATIVE NEGATIVE Final    Comment: (NOTE) If result is NEGATIVE SARS-CoV-2 target nucleic acids are NOT DETECTED. The SARS-CoV-2 RNA is generally detectable in upper and  lower  respiratory specimens during the acute phase of infection. The lowest  concentration of SARS-CoV-2 viral copies this assay can detect is 250  copies / mL. A negative result does not preclude SARS-CoV-2 infection  and should not be used as the sole basis for treatment or other  patient management decisions.  A negative result may occur with  improper specimen collection / handling, submission of specimen other  than nasopharyngeal swab, presence of viral mutation(s) within the  areas targeted by this assay, and inadequate number of viral copies  (<250 copies / mL). A negative result must be combined with clinical  observations, patient history, and epidemiological information. If result is POSITIVE SARS-CoV-2 target nucleic acids are DETECTED. The SARS-CoV-2 RNA is generally detectable in upper and lower  respiratory specimens dur ing the acute phase of infection.  Positive  results are indicative of active infection with SARS-CoV-2.  Clinical  correlation with patient history and other diagnostic information is  necessary to determine patient infection status.  Positive results do  not rule out bacterial infection or co-infection with other viruses. If result is PRESUMPTIVE POSTIVE SARS-CoV-2 nucleic acids MAY BE PRESENT.   A presumptive positive result was obtained on the submitted specimen  and confirmed on repeat testing.  While 2019 novel coronavirus  (SARS-CoV-2) nucleic acids may be present in the submitted sample  additional confirmatory testing may be necessary for epidemiological  and / or clinical management purposes  to differentiate between  SARS-CoV-2 and other Sarbecovirus currently known to infect humans.  If clinically indicated additional testing with an alternate test  methodology [id number]) is advised. The SARS-CoV-2 RNA is generally  detectable in upper and lower respiratory sp ecimens during the acute  phase of infection. The expected result is  Negative. Fact Sheet for Patients:  StrictlyIdeas.no Fact Sheet for Healthcare Providers: BankingDealers.co.za This test is not yet approved or cleared by the [place] FDA and has been authorized for detection and/or diagnosis of SARS-CoV-2 by FDA under an Emergency Use Authorization (EUA).  This EUA will remain in effect (meaning this test can be used) for the duration of the COVID-19 declaration under Section 564(b)(1) of the Act, 21 U.S.C. section 360bbb-3(b)(1), unless the authorization is terminated or revoked sooner. Performed at [facility], [address] [place], [place] [address]      Studies: Nm Hepato W/eject Fract  Result Date: 09/12/2018 CLINICAL DATA:  Right upper quadrant pain. EXAM: NUCLEAR MEDICINE HEPATOBILIARY IMAGING WITH GALLBLADDER EF TECHNIQUE: Sequential images of the abdomen were obtained out to 60 minutes following intravenous administration of radiopharmaceutical.  After oral ingestion of Ensure, gallbladder ejection fraction was determined. At 60 min, normal ejection fraction is greater than 33%. RADIOPHARMACEUTICALS:  5.439 mCi Tc-8152m  Choletec IV COMPARISON:  Right upper quadrant ultrasound dated September 06, 2018. FINDINGS: Prompt uptake and biliary excretion of activity by the liver is seen. Gallbladder activity is visualized, consistent with patency of cystic duct. Biliary activity passes into small bowel, consistent with patent common bile duct. Calculated gallbladder ejection fraction is 99%. (Normal gallbladder ejection fraction with Ensure is greater than 33%.) IMPRESSION: Normal hepatobiliary scan. Electronically Signed   By: Obie DredgeWilliam T Derry M.D.   On: 09/12/2018 13:25    Scheduled Meds: . dronedarone  400 mg Oral BID WC  . feeding supplement (ENSURE ENLIVE)  237 mL Oral BID BM  . metoprolol succinate  12.5 mg Oral Daily  . multivitamin with minerals  1 tablet Oral Daily  . nystatin  5 mL Oral  QID  . pantoprazole (PROTONIX) IV  40 mg Intravenous Q12H   Continuous Infusions: . sodium chloride Stopped (09/12/18 2100)    Assessment/Plan:  1. Upper GI bleed with epigastric abdominal pain.  Patient received 1 dose of vitamin K upon presentation,  on Protonix.  Endoscopy done and reported as negative.  colonoscopy 09/10/2018 has revealed diverticulosis but no diverticulitis no active bleeding..   scheduled for capsule study.   hemoglobin has come down to 8.3.--8.0 -7.8 -7.6.-8.3-7.5 had another episode of black stool on 09/11/2018 continue to watch for need for transfusion and monitor hemoglobin.  Holding Coumadin and patient is started on heparin drip without bolus which will be continued until tomorrow a.m.  N.p.o. after midnight for enteroscopy.  Discussed with Dr. Norma Fredricksonoledo.Out patient follow-up with Dr. Norma Fredricksonoledo after discharge  2. Acute right upper quadrant pain-CT scan with cholelithiasis.  Normal HIDA scan and repeat LFTs.  Surgery consult placed discussed with Dr. Aleen CampiPiscoya.  No surgical interventions are needed at this time.  Surgery signed off 3. Coagulopathy with high INR.  Vitamin K given on presentation.  INR at 1.9 Will resume Coumadin 4.  atrial fibrillation on metoprolol and antiarrhythmic.  Resume Coumadin at this time   Risk of stroke higher off anticoagulation. 5. Suspected diverticulitis.  Patient has completed antibiotic course and antibiotics discontinued colonoscopy with diverticulosis but no diverticulitis 6. Acute kidney injury.  Restart IV fluids.  Hold losartan.  Renal function is improving creatinine trended down to 1.19-1.17--1.16 7. 7 PT consult for deconditioning  Code Status:     Code Status Orders  (From admission, onward)         Start     Ordered   09/06/18 1321  Full code  Continuous     09/06/18 1321        Code Status History    This patient has a current code status but no historical code status.   Advance Care Planning Activity     Family  Communication: Call placed to the daughter to call back  disposition Plan: To be determined  Consultants:  Gastroenterology  Antibiotics:  Zosyn  Time spent:33 minutes  Deanna ArtisAruna Kamiah Fite  [employer]

## 2018-09-14 NOTE — Progress Notes (Signed)
[facility] Inpatient Progress Note  Subjective: Patient seen for f/u obscure overt GI bleeding. SBCE was read today showing active bleeding in the proximal and mid-jejunum, presumably from small bowel angioectasias.  Patient denies further melena. Has mild lower quadrant abdominal pain.   Objective: Vital signs in last 24 hours: Temp:  [97.9 F (36.6 C)-98.2 F (36.8 C)] 98.2 F (36.8 C) (07/01 0519) Pulse Rate:  [64-72] 64 (07/01 0519) Resp:  [17-20] 17 (07/01 0519) BP: (115-146)/(50-60) 115/50 (07/01 0519) SpO2:  [95 %-100 %] 95 % (07/01 0519) Blood pressure (!) 115/50, pulse 64, temperature 98.2 F (36.8 C), temperature source Oral, resp. rate 17, height 5' 7" (1.702 m), weight 58.7 kg, SpO2 95 %.    Intake/Output from previous day: 06/30 0701 - 07/01 0700 In: 277.4 [P.O.:240; IV Piggyback:37.4] Out: -   Intake/Output this shift: Total I/O In: 301.1 [P.O.:240; IV Piggyback:61.1] Out: -    General appearance:  Alert, NAD Resp:  CTA Cardio:  RRR GI:  Soft, mildly tender in lower abdomen. BS+ Extremities: No edema.   Lab Results: Results for orders placed or performed during the hospital encounter of 09/06/18 (from the past 24 hour(s))  Hemoglobin and hematocrit, blood     Status: Abnormal   Collection Time: 09/13/18  8:31 PM  Result Value Ref Range   Hemoglobin 8.3 (L) 12.0 - 15.0 g/dL   HCT 26.2 (L) 36.0 - 46.0 %  Protime-INR     Status: Abnormal   Collection Time: 09/14/18  3:42 AM  Result Value Ref Range   Prothrombin Time 21.2 (H) 11.4 - 15.2 seconds   INR 1.9 (H) 0.8 - 1.2  CBC     Status: Abnormal   Collection Time: 09/14/18  3:42 AM  Result Value Ref Range   WBC 5.4 4.0 - 10.5 K/uL   RBC 2.34 (L) 3.87 - 5.11 MIL/uL   Hemoglobin 7.5 (L) 12.0 - 15.0 g/dL   HCT 23.8 (L) 36.0 - 46.0 %   MCV 101.7 (H) 80.0 - 100.0 fL   MCH 32.1 26.0 - 34.0 pg   MCHC 31.5 30.0 - 36.0 g/dL   RDW 15.5 11.5 - 15.5 %   Platelets 176 150 - 400 K/uL   nRBC  0.0 0.0 - 0.2 %     Recent Labs    09/12/18 0444  09/13/18 0440 09/13/18 2031 09/14/18 0342  WBC 3.6*  --  4.1  --  5.4  HGB 7.6*   < > 7.5* 8.3* 7.5*  HCT 24.0*   < > 23.0* 26.2* 23.8*  PLT 176  --  163  --  176   < > = values in this interval not displayed.   BMET Recent Labs    09/12/18 0444 09/13/18 0440  NA 143 140  K 4.2 4.0  CL 113* 110  CO2 26 26  GLUCOSE 92 80  BUN 12 11  CREATININE 1.17* 1.16*  CALCIUM 8.6* 8.4*   LFT Recent Labs    09/13/18 0440  PROT 5.7*  ALBUMIN 2.9*  AST 16  ALT 9  ALKPHOS 36*  BILITOT 0.7   PT/INR Recent Labs    09/13/18 0440 09/14/18 0342  LABPROT 22.7* 21.2*  INR 2.0* 1.9*   Hepatitis Panel No results for input(s): HEPBSAG, HCVAB, HEPAIGM, HEPBIGM in the last 72 hours. C-Diff No results for input(s): CDIFFTOX in the last 72 hours. No results for input(s): CDIFFPCR in the last 72 hours.   Studies/Results: No results found.  Scheduled Inpatient   Medications:   . dronedarone  400 mg Oral BID WC  . feeding supplement (ENSURE ENLIVE)  237 mL Oral BID BM  . metoprolol succinate  12.5 mg Oral Daily  . multivitamin with minerals  1 tablet Oral Daily  . nystatin  5 mL Oral QID  . pantoprazole (PROTONIX) IV  40 mg Intravenous Q12H    Continuous Inpatient Infusions:   . sodium chloride Stopped (09/12/18 2100)  . heparin      PRN Inpatient Medications:  sodium chloride, acetaminophen **OR** acetaminophen, alum & mag hydroxide-simeth, diphenhydrAMINE **OR** diphenhydrAMINE, oxyCODONE  Miscellaneous: N/A  Assessment:  1. Small bowel bleeding - likely from AVM's.   Plan:  1. Proceed with SBE with hemostasis after FFP and control of INR down to less than 1.5. IV heparin has been advised and this is being started by primary team to help prevent clotting of heart valve replacement. The patient understands the nature of the planned procedure, indications, risks, alternatives and potential complications including but not  limited to bleeding, infection, perforation, damage to internal organs and possible oversedation/side effects from anesthesia. The patient agrees and gives consent to proceed.  Please refer to procedure notes for findings, recommendations and patient disposition/instructions. 2. NPO after MN.  Philo Kurtz K. Alice Reichert, M.D. 09/14/2018, 1:50 PM

## 2018-09-14 NOTE — Consult Note (Signed)
[place] for heparin drip management Indication: atrial fibrillation  Patient Measurements: Height: 5\' 7"  (170.2 cm) Weight: 129 lb 8 oz (58.7 kg) IBW/kg (Calculated) : 61.6  Vital Signs: Temp: 98.3 F (36.8 C) (07/01 1515) Temp Source: Oral (07/01 1515) BP: 131/57 (07/01 1515) Pulse Rate: 75 (07/01 1515)  Labs: Recent Labs    09/12/18 0444  09/13/18 0440 09/13/18 2031 09/14/18 0342 09/14/18 1858 09/14/18 2249  HGB 7.6*   < > 7.5* 8.3* 7.5* 8.3*  --   HCT 24.0*   < > 23.0* 26.2* 23.8* 25.5*  --   PLT 176  --  163  --  176  --   --   LABPROT 25.6*  --  22.7*  --  21.2*  --   --   INR 2.4*  --  2.0*  --  1.9*  --   --   HEPARINUNFRC  --   --   --   --   --   --  0.39  CREATININE 1.17*  --  1.16*  --   --   --   --    < > = values in this interval not displayed.    Estimated Creatinine Clearance: 33.5 mL/min (A) (by C-G formula based on SCr of 1.16 mg/dL (H)).   Medical History: Past Medical History:  Diagnosis Date  . Anxiety and depression   . Atherosclerosis   . B12 deficiency   . Degenerative joint disease (DJD) of hip   . Fibromyalgia syndrome   . HH (hiatus hernia)   . Hypertension   . IBS (irritable bowel syndrome)   . Osteopenia   . Paroxysmal A-fib ([place])   . Sinoatrial node dysfunction (HCC)    S/p pacemaker implant 04/17/08. Atrial lead revision 06/08/08    . Valvular heart disease    S/p tricuspid valve replacement with a 43mm bioprosthesis and mitral valve repair with St Jude SARP26 and epicardial modified MAZE procedure on 10/28/06      Medications:  Scheduled:  . dronedarone  400 mg Oral BID WC  . feeding supplement (ENSURE ENLIVE)  237 mL Oral BID BM  . metoprolol succinate  12.5 mg Oral Daily  . multivitamin with minerals  1 tablet Oral Daily  . nystatin  5 mL Oral QID  . pantoprazole (PROTONIX) IV  40 mg Intravenous Q12H    Assessment: Patient was admitted for GI bleed (melena) and anemia. Warfarin  has been held off and on since admission (last dose 6/27: 2.5 mg).  Endoscopy done and reported as negative, colonoscopy 09/10/2018 has revealed diverticulosis but no diverticulitis & no active bleeding. A capsule study 6/30 revealed bleeding in the small intestine.  She had another episode of black stool on 09/11/2018. She is being started on a heparin drip without bolus which will be continued until tomorrow a.m.  NPO after midnight for enteroscopy.  Goal of Therapy:  Heparin level 0.3-0.7 units/ml Monitor platelets by anticoagulation protocol: Yes   Plan:  07/01 @ 2300 HL 0.39 therapeutic. Will continue current rate and will recheck HL @ 0700, H/h low stable will continue to monitor.  Tobie Lords, PharmD, BCPS Clinical Pharmacist 09/14/2018,11:19 PM

## 2018-09-15 ENCOUNTER — Encounter: Payer: Self-pay | Admitting: Anesthesiology

## 2018-09-15 ENCOUNTER — Inpatient Hospital Stay: Payer: Medicare Other | Admitting: Anesthesiology

## 2018-09-15 ENCOUNTER — Encounter
Admission: EM | Disposition: A | Discharge status: Home / Self Care | Payer: Self-pay | Source: Home / Self Care | Attending: Internal Medicine

## 2018-09-15 HISTORY — PX: ESOPHAGOGASTRODUODENOSCOPY: SHX5428

## 2018-09-15 LAB — CBC
HCT: 23.2 % — ABNORMAL LOW (ref 36.0–46.0)
Hemoglobin: 7.4 g/dL — ABNORMAL LOW (ref 12.0–15.0)
MCH: 31.8 pg (ref 26.0–34.0)
MCHC: 31.9 g/dL (ref 30.0–36.0)
MCV: 99.6 fL (ref 80.0–100.0)
Platelets: 159 10*3/uL (ref 150–400)
RBC: 2.33 MIL/uL — ABNORMAL LOW (ref 3.87–5.11)
RDW: 15.7 % — ABNORMAL HIGH (ref 11.5–15.5)
WBC: 6.2 10*3/uL (ref 4.0–10.5)
nRBC: 0 % (ref 0.0–0.2)

## 2018-09-15 LAB — HEPARIN LEVEL (UNFRACTIONATED)
Heparin Unfractionated: 0.66 IU/mL (ref 0.30–0.70)
Heparin Unfractionated: <0.1 IU/mL — ABNORMAL LOW (ref 0.30–0.70)

## 2018-09-15 LAB — PROTIME-INR
INR: 1.6 — ABNORMAL HIGH (ref 0.8–1.2)
Prothrombin Time: 18.9 seconds — ABNORMAL HIGH (ref 11.4–15.2)

## 2018-09-15 LAB — PREPARE RBC (CROSSMATCH)

## 2018-09-15 SURGERY — EGD (ESOPHAGOGASTRODUODENOSCOPY)
Anesthesia: General

## 2018-09-15 MED ORDER — PROPOFOL 500 MG/50ML IV EMUL
INTRAVENOUS | Status: DC | PRN
  Administered 2018-09-15: 50 ug/kg/min via INTRAVENOUS

## 2018-09-15 MED ORDER — HEPARIN (PORCINE) 25000 UT/250ML-% IV SOLN
750.0000 [IU]/h | INTRAVENOUS | Status: DC
  Administered 2018-09-16: 750 [IU]/h via INTRAVENOUS
  Filled 2018-09-15: qty 250

## 2018-09-15 MED ORDER — FENTANYL CITRATE (PF) 100 MCG/2ML IJ SOLN
INTRAMUSCULAR | Status: AC
  Filled 2018-09-15: qty 2

## 2018-09-15 MED ORDER — WARFARIN SODIUM 2.5 MG PO TABS
2.5000 mg | ORAL_TABLET | Freq: Once | ORAL | Status: AC
  Administered 2018-09-15: 2.5 mg via ORAL
  Filled 2018-09-15: qty 1

## 2018-09-15 MED ORDER — LIDOCAINE HCL (CARDIAC) PF 100 MG/5ML IV SOSY
PREFILLED_SYRINGE | INTRAVENOUS | Status: DC | PRN
  Administered 2018-09-15: 80 mg via INTRAVENOUS

## 2018-09-15 MED ORDER — PROPOFOL 10 MG/ML IV BOLUS
INTRAVENOUS | Status: AC
  Filled 2018-09-15: qty 20

## 2018-09-15 MED ORDER — SODIUM CHLORIDE 0.9% IV SOLUTION: Freq: Once | INTRAVENOUS | Status: DC

## 2018-09-15 MED ORDER — FENTANYL CITRATE (PF) 100 MCG/2ML IJ SOLN
INTRAMUSCULAR | Status: DC | PRN
  Administered 2018-09-15: 50 ug via INTRAVENOUS
  Administered 2018-09-15: 25 ug via INTRAVENOUS

## 2018-09-15 MED ORDER — WARFARIN - PHARMACIST DOSING INPATIENT
Freq: Every day | Status: DC
  Administered 2018-09-15: 19:00:00

## 2018-09-15 MED ORDER — PANTOPRAZOLE SODIUM 40 MG PO TBEC
40.0000 mg | DELAYED_RELEASE_TABLET | Freq: Two times a day (BID) | ORAL | Status: DC
  Administered 2018-09-16: 40 mg via ORAL
  Filled 2018-09-15: qty 1

## 2018-09-15 MED ORDER — PROPOFOL 10 MG/ML IV BOLUS
INTRAVENOUS | Status: DC | PRN
  Administered 2018-09-15: 50 mg via INTRAVENOUS

## 2018-09-15 NOTE — Consult Note (Addendum)
ANTICOAGULATION CONSULT NOTE  Pharmacy Consult for Warfarin  management Indication: atrial fibrillation  Patient Measurements: Height: 5\' 7"  (170.2 cm) Weight: 129 lb 8 oz (58.7 kg) IBW/kg (Calculated) : 61.6  Vital Signs: Temp: 99.1 F (37.3 C) (07/02 0526) Temp Source: Oral (07/02 0526) BP: 114/54 (07/02 0526) Pulse Rate: 68 (07/02 0526)  Labs: Recent Labs    09/13/18 0440  09/14/18 0342 09/14/18 1858 09/14/18 2249 09/15/18 0641  HGB 7.5*   < > 7.5* 8.3*  --  7.4*  HCT 23.0*   < > 23.8* 25.5*  --  23.2*  PLT 163  --  176  --   --  159  LABPROT 22.7*  --  21.2*  --   --  18.9*  INR 2.0*  --  1.9*  --   --  1.6*  HEPARINUNFRC  --   --   --   --  0.39 0.66  CREATININE 1.16*  --   --   --   --   --    < > = values in this interval not displayed.    Estimated Creatinine Clearance: 33.5 mL/min (A) (by C-G formula based on SCr of 1.16 mg/dL (H)).   Medical History: Past Medical History:  Diagnosis Date  . Anxiety and depression   . Atherosclerosis   . B12 deficiency   . Degenerative joint disease (DJD) of hip   . Fibromyalgia syndrome   . HH (hiatus hernia)   . Hypertension   . IBS (irritable bowel syndrome)   . Osteopenia   . Paroxysmal A-fib ([place])   . Sinoatrial node dysfunction (HCC)    S/p pacemaker implant 04/17/08. Atrial lead revision 06/08/08    . Valvular heart disease    S/p tricuspid valve replacement with a 6mm bioprosthesis and mitral valve repair with St Jude SARP26 and epicardial modified MAZE procedure on 10/28/06      Medications:  Scheduled:  . dronedarone  400 mg Oral BID WC  . feeding supplement (ENSURE ENLIVE)  237 mL Oral BID BM  . metoprolol succinate  12.5 mg Oral Daily  . multivitamin with minerals  1 tablet Oral Daily  . nystatin  5 mL Oral QID  . pantoprazole (PROTONIX) IV  40 mg Intravenous Q12H    Assessment: Patient was admitted for GI bleed (melena) and anemia. Warfarin has been held off and on since admission (last dose 6/27:  2.5 mg).  Endoscopy done and reported as negative, colonoscopy 09/10/2018 has revealed diverticulosis but no diverticulitis & no active bleeding. A capsule study 6/30 revealed bleeding in the small intestine.  She had another episode of black stool on 09/11/2018.   INR subtherapeutic. Will resume PTA warfarin today (as discussed with attending).   Heparin treatment (7/1):  07/01 @ 2300 HL 0.66 therapeutic but rose from 0.39, patient has a GI bleed. Will decreased rate to 750 units/hr 07/02 heparin stopped for endoscopy today @ 0759.    Warfarin Treatment: Date  INR  Warfarin (mg)  6/23 6.4 Held (vitamin K)  6/24 5.2  Held (vitamin K)  6/25 2.7 Held  6/26 1.8 Held   6/27 1.8 2.5mg   6/28 2.0 Held  6/29 2.4 Held  6/30 2.0 2.5mg   7/1 1.9 held  7/2 1.6                      Goal of Therapy:  INR 2-3 Monitor platelets by anticoagulation protocol: Yes   Plan:   Will resume warfarin  2.5 mg @ 1800 today.    CBC at least every 3 days per protocol   INRs daily until therapeutic   Cephus ShellingAsajah Rabab Currington, PharmD Clinical Pharmacist 09/15/2018,9:33 AM

## 2018-09-15 NOTE — Anesthesia Postprocedure Evaluation (Signed)
Anesthesia Post Note  Patient: Tina Barron  Procedure(s) Performed: ESOPHAGOGASTRODUODENOSCOPY (EGD) (N/A )  Patient location during evaluation: Endoscopy Anesthesia Type: General Level of consciousness: awake and alert Pain management: pain level controlled Vital Signs Assessment: post-procedure vital signs reviewed and stable Respiratory status: spontaneous breathing, nonlabored ventilation, respiratory function stable and patient connected to nasal cannula oxygen Cardiovascular status: blood pressure returned to baseline and stable Postop Assessment: no apparent nausea or vomiting Anesthetic complications: no     Last Vitals:  Vitals:   09/15/18 1339 09/15/18 1515  BP: (!) 117/51 110/62  Pulse:  68  Resp:  17  Temp: (!) 36.1 C (!) 36.3 C  SpO2:  99%    Last Pain:  Vitals:   09/15/18 1515  TempSrc: Oral  PainSc:                  Sherral Dirocco S

## 2018-09-15 NOTE — Progress Notes (Signed)
Patient ID: Tina Barron, female   DOB: [birth year], 85 y.o.   MRN: [id number]  [employer] PROGRESS NOTE  Tina Barron JSE:[id number] DOB: 1932-10-23 DOA: 09/06/2018 PCP: Marinda Elk, MD  HPI/Subjective: Patient seen and evaluated today Some melanotic stool No vomiting of blood Due for endoscopy Objective: Vitals:   09/15/18 0526 09/15/18 1206  BP: (!) 114/54 129/62  Pulse: 68 66  Resp: 20 20  Temp: 99.1 F (37.3 C) (!) 97.4 F (36.3 C)  SpO2: 95% 97%    Intake/Output Summary (Last 24 hours) at 09/15/2018 1318 Last data filed at 09/15/2018 0755 Gross per 24 hour  Intake 246.91 ml  Output -  Net 246.91 ml   Filed Weights   09/06/18 1035 09/06/18 2030 09/10/18 1010  Weight: 56.7 kg 58.7 kg 58.7 kg    ROS: Review of Systems  Constitutional: Negative for chills and fever.  Eyes: Negative for blurred vision.  Respiratory: Negative for cough and shortness of breath.   Cardiovascular: Negative for chest pain.  Gastrointestinal: Positive for abdominal pain. Negative for constipation, diarrhea, nausea and vomiting.  Genitourinary: Negative for dysuria.  Musculoskeletal: Negative for joint pain.  Neurological: Negative for dizziness and headaches.   Exam: Physical Exam  Constitutional: She is oriented to person, place, and time.  HENT:  Nose: No mucosal edema.  Mouth/Throat: No oropharyngeal exudate or posterior oropharyngeal edema.  Eyes: Pupils are equal, round, and reactive to light. Conjunctivae, EOM and lids are normal.  Neck: No JVD present. Carotid bruit is not present. No edema present. No thyroid mass and no thyromegaly present.  Cardiovascular: S1 normal and S2 normal. Exam reveals no gallop.  No murmur heard. Pulses:      Dorsalis pedis pulses are 2+ on the right side and 2+ on the left side.  Respiratory: No respiratory distress. She has no wheezes. She has no rhonchi. She has no rales.  GI: Soft. Bowel sounds are normal. She exhibits distension.  There is abdominal tenderness.  Musculoskeletal:     Right ankle: She exhibits no swelling.     Left ankle: She exhibits no swelling.  Lymphadenopathy:    She has no cervical adenopathy.  Neurological: She is alert and oriented to person, place, and time. No cranial nerve deficit.  Skin: Skin is warm. No rash noted. Nails show no clubbing.  Psychiatric: She has a normal mood and affect.      Data Reviewed: Basic Metabolic Panel: Recent Labs  Lab 09/09/18 0442 09/10/18 0445 09/12/18 0444 09/13/18 0440  NA  --   --  143 140  K  --   --  4.2 4.0  CL  --   --  113* 110  CO2  --   --  26 26  GLUCOSE  --   --  92 80  BUN  --   --  12 11  CREATININE 1.35* 1.19* 1.17* 1.16*  CALCIUM  --   --  8.6* 8.4*   Liver Function Tests: Recent Labs  Lab 09/12/18 0444 09/13/18 0440  AST 15 16  ALT 8 9  ALKPHOS 41 36*  BILITOT 0.6 0.7  PROT 5.7* 5.7*  ALBUMIN 3.0* 2.9*   CBC: Recent Labs  Lab 09/11/18 0545 09/12/18 0444  09/13/18 0440 09/13/18 2031 09/14/18 0342 09/14/18 1858 09/15/18 0641  WBC 3.3* 3.6*  --  4.1  --  5.4  --  6.2  HGB 7.8* 7.6*   < > 7.5* 8.3* 7.5* 8.3* 7.4*  HCT 25.1*  24.0*   < > 23.0* 26.2* 23.8* 25.5* 23.2*  MCV 101.2* 100.8*  --  98.7  --  101.7*  --  99.6  PLT 181 176  --  163  --  176  --  159   < > = values in this interval not displayed.    Recent Results (from the past 240 hour(s))  SARS Coronavirus 2 (CEPHEID - Performed in [facility]), Hosp Order     Status: None   Collection Time: 09/06/18 12:32 PM   Specimen: Nasopharyngeal Swab  Result Value Ref Range Status   SARS Coronavirus 2 NEGATIVE NEGATIVE Final    Comment: (NOTE) If result is NEGATIVE SARS-CoV-2 target nucleic acids are NOT DETECTED. The SARS-CoV-2 RNA is generally detectable in upper and lower  respiratory specimens during the acute phase of infection. The lowest  concentration of SARS-CoV-2 viral copies this assay can detect is 250  copies / mL. A negative  result does not preclude SARS-CoV-2 infection  and should not be used as the sole basis for treatment or other  patient management decisions.  A negative result may occur with  improper specimen collection / handling, submission of specimen other  than nasopharyngeal swab, presence of viral mutation(s) within the  areas targeted by this assay, and inadequate number of viral copies  (<250 copies / mL). A negative result must be combined with clinical  observations, patient history, and epidemiological information. If result is POSITIVE SARS-CoV-2 target nucleic acids are DETECTED. The SARS-CoV-2 RNA is generally detectable in upper and lower  respiratory specimens dur ing the acute phase of infection.  Positive  results are indicative of active infection with SARS-CoV-2.  Clinical  correlation with patient history and other diagnostic information is  necessary to determine patient infection status.  Positive results do  not rule out bacterial infection or co-infection with other viruses. If result is PRESUMPTIVE POSTIVE SARS-CoV-2 nucleic acids MAY BE PRESENT.   A presumptive positive result was obtained on the submitted specimen  and confirmed on repeat testing.  While 2019 novel coronavirus  (SARS-CoV-2) nucleic acids may be present in the submitted sample  additional confirmatory testing may be necessary for epidemiological  and / or clinical management purposes  to differentiate between  SARS-CoV-2 and other Sarbecovirus currently known to infect humans.  If clinically indicated additional testing with an alternate test  methodology [phone]([id number]) is advised. The SARS-CoV-2 RNA is generally  detectable in upper and lower respiratory sp ecimens during the acute  phase of infection. The expected result is Negative. Fact Sheet for Patients:  BoilerBrush.com.cyhttps://www.fda.gov/media/136312/download Fact Sheet for Healthcare Providers: https://pope.com/https://www.fda.gov/media/136313/download This test is not yet  approved or cleared by the [place] FDA and has been authorized for detection and/or diagnosis of SARS-CoV-2 by FDA under an Emergency Use Authorization (EUA).  This EUA will remain in effect (meaning this test can be used) for the duration of the COVID-19 declaration under Section 564(b)(1) of the Act, 21 U.S.C. section 360bbb-3(b)(1), unless the authorization is terminated or revoked sooner. Performed at [facility], [address] [place], [place] [address]      Studies: No results found.  Scheduled Meds: . [MAR Hold] dronedarone  400 mg Oral BID WC  . [MAR Hold] feeding supplement (ENSURE ENLIVE)  237 mL Oral BID BM  . [MAR Hold] metoprolol succinate  12.5 mg Oral Daily  . [MAR Hold] multivitamin with minerals  1 tablet Oral Daily  . [MAR Hold] nystatin  5 mL  Oral QID  . [MAR Hold] pantoprazole (PROTONIX) IV  40 mg Intravenous Q12H   Continuous Infusions: . [MAR Hold] sodium chloride 1,000 mL (09/15/18 1210)    Assessment/Plan:  1. Upper GI bleed with epigastric abdominal pain.  Patient received 1 dose of vitamin K upon presentation,  on Protonix.  Endoscopy done and reported as negative.  colonoscopy 09/10/2018 has revealed diverticulosis but no diverticulitis no active bleeding..   scheduled for capsule study.   hemoglobin has come down to 8.3.--8.0 -7.8 -7.6.-8.3-7.5 had another episode of black stool on 09/11/2018 continue to watch for need for transfusion and monitor hemoglobin.  Holding Coumadin and patient is started on heparin drip without bolus which will be continued until tomorrow a.m.  2. Endoscopy today by GI for surgical intervention.  Discussed with Dr. Norma Fredricksonoledo.Out patient follow-up with Dr. Norma Fredricksonoledo after discharge  3. Acute right upper quadrant pain-CT scan with cholelithiasis.  Normal HIDA scan and repeat LFTs.  Surgery consult placed discussed with Dr. Aleen CampiPiscoya.  No surgical interventions are needed at this time.  Surgery signed off 4. Coagulopathy  with high INR.  Vitamin K given on presentation.  INR at 1.9 Will resume Coumadin 5.  atrial fibrillation on metoprolol and antiarrhythmic.  Resume Coumadin at this time   Risk of stroke higher off anticoagulation.  Status post cardiology follow-up 6. Suspected diverticulitis.  Patient has completed antibiotic course and antibiotics discontinued colonoscopy with diverticulosis but no diverticulitis 7. Acute kidney injury.  Restart IV fluids.  Hold losartan.  Renal function is improving creatinine trended down to 1.19-1.17--1.16 8. 7 PT consult for deconditioning  Code Status: Full code    Code Status Orders  (From admission, onward)         Start     Ordered   09/06/18 1321  Full code  Continuous     09/06/18 1321        Code Status History    This patient has a current code status but no historical code status.   Advance Care Planning Activity      Consultants:  Gastroenterology  Antibiotics:  Zosyn  Time spent:35 minutes  Vivien RotaPavan Daaiel Starlin  [employer]

## 2018-09-15 NOTE — Consult Note (Signed)
[place] for heparin drip management Indication: atrial fibrillation  Patient Measurements: Height: 5\' 7"  (170.2 cm) Weight: 129 lb 8 oz (58.7 kg) IBW/kg (Calculated) : 61.6  Vital Signs: Temp: 99.1 F (37.3 C) (07/02 0526) Temp Source: Oral (07/02 0526) BP: 114/54 (07/02 0526) Pulse Rate: 68 (07/02 0526)  Labs: Recent Labs    09/13/18 0440  09/14/18 0342 09/14/18 1858 09/14/18 2249 09/15/18 0641  HGB 7.5*   < > 7.5* 8.3*  --  7.4*  HCT 23.0*   < > 23.8* 25.5*  --  23.2*  PLT 163  --  176  --   --  159  LABPROT 22.7*  --  21.2*  --   --   --   INR 2.0*  --  1.9*  --   --   --   HEPARINUNFRC  --   --   --   --  0.39 0.66  CREATININE 1.16*  --   --   --   --   --    < > = values in this interval not displayed.    Estimated Creatinine Clearance: 33.5 mL/min (A) (by C-G formula based on SCr of 1.16 mg/dL (H)).   Medical History: Past Medical History:  Diagnosis Date  . Anxiety and depression   . Atherosclerosis   . B12 deficiency   . Degenerative joint disease (DJD) of hip   . Fibromyalgia syndrome   . HH (hiatus hernia)   . Hypertension   . IBS (irritable bowel syndrome)   . Osteopenia   . Paroxysmal A-fib ([place])   . Sinoatrial node dysfunction ([facility])    S/p pacemaker implant 04/17/08. Atrial lead revision 06/08/08    . Valvular heart disease    S/p tricuspid valve replacement with a 18mm bioprosthesis and mitral valve repair with St Jude SARP26 and epicardial modified MAZE procedure on 10/28/06      Medications:  Scheduled:  . dronedarone  400 mg Oral BID WC  . feeding supplement (ENSURE ENLIVE)  237 mL Oral BID BM  . metoprolol succinate  12.5 mg Oral Daily  . multivitamin with minerals  1 tablet Oral Daily  . nystatin  5 mL Oral QID  . pantoprazole (PROTONIX) IV  40 mg Intravenous Q12H    Assessment: Patient was admitted for GI bleed (melena) and anemia. Warfarin has been held off and on since admission (last dose  6/27: 2.5 mg).  Endoscopy done and reported as negative, colonoscopy 09/10/2018 has revealed diverticulosis but no diverticulitis & no active bleeding. A capsule study 6/30 revealed bleeding in the small intestine.  She had another episode of black stool on 09/11/2018. She is being started on a heparin drip without bolus which will be continued until tomorrow a.m.  NPO after midnight for enteroscopy.  Goal of Therapy:  Heparin level 0.3-0.7 units/ml Monitor platelets by anticoagulation protocol: Yes   Plan:  07/01 @ 2300 HL 0.66 therapeutic but rose from 0.39, patient has a GI bleed. Will decreased rate to 750 units/hr and will recheck HL @ 1500, Hgb down by 1 unit, patient going in for another EGD today, will continue to monitor.  Tobie Lords, PharmD, BCPS Clinical Pharmacist 09/15/2018,7:22 AM

## 2018-09-15 NOTE — Transfer of Care (Signed)
Immediate Anesthesia Transfer of Care Note  Patient: Tina Barron  Procedure(s) Performed: ESOPHAGOGASTRODUODENOSCOPY (EGD) (N/A )  Patient Location: PACU  Anesthesia Type:General  Level of Consciousness: awake, alert  and oriented  Airway & Oxygen Therapy: Patient Spontanous Breathing  Post-op Assessment: Report given to RN and Post -op Vital signs reviewed and stable  Post vital signs: Reviewed and stable  Last Vitals:  Vitals Value Taken Time  BP    Temp    Pulse 58 09/15/18 1338  Resp 21 09/15/18 1338  SpO2 99 % 09/15/18 1338  Vitals shown include unvalidated device data.  Last Pain:  Vitals:   09/15/18 1206  TempSrc: Tympanic  PainSc:          Complications: No apparent anesthesia complications

## 2018-09-15 NOTE — Interval H&P Note (Signed)
History and Physical Interval Note:  09/15/2018 12:51 PM  Tina Barron  has presented today for surgery, with the diagnosis of Small bowel bleed.  The various methods of treatment have been discussed with the patient and family. After consideration of risks, benefits and other options for treatment, the patient has consented to  Procedure(s): ESOPHAGOGASTRODUODENOSCOPY (EGD) (N/A) as a surgical intervention.  The patient's history has been reviewed, patient examined, no change in status, stable for surgery.  I have reviewed the patient's chart and labs.  Questions were answered to the patient's satisfaction.     [place], [place]

## 2018-09-15 NOTE — Anesthesia Preprocedure Evaluation (Signed)
Anesthesia Evaluation  Patient identified by MRN, date of birth, ID band Patient awake    Reviewed: Allergy & Precautions, NPO status , Patient's Chart, lab work & pertinent test results, reviewed documented beta blocker date and time   Airway Mallampati: II  TM Distance: >3 FB     Dental  (+) Chipped   Pulmonary           Cardiovascular hypertension, Pt. on medications and Pt. on home beta blockers + pacemaker      Neuro/Psych    GI/Hepatic hiatal hernia,   Endo/Other    Renal/GU      Musculoskeletal  (+) Arthritis ,   Abdominal   Peds  Hematology  (+) anemia ,   Anesthesia Other Findings MVR. Hb 7.4. EKG ok.  Reproductive/Obstetrics                             Anesthesia Physical Anesthesia Plan  ASA: III  Anesthesia Plan: General   Post-op Pain Management:    Induction: Intravenous  PONV Risk Score and Plan:   Airway Management Planned:   Additional Equipment:   Intra-op Plan:   Post-operative Plan:   Informed Consent: I have reviewed the patients History and Physical, chart, labs and discussed the procedure including the risks, benefits and alternatives for the proposed anesthesia with the patient or authorized representative who has indicated his/her understanding and acceptance.       Plan Discussed with: CRNA  Anesthesia Plan Comments:         Anesthesia Quick Evaluation

## 2018-09-15 NOTE — Plan of Care (Signed)
  Problem: Pain Managment: Goal: General experience of comfort will improve Outcome: Progressing  Patient has mild pain but prefers no medications after procedure.

## 2018-09-15 NOTE — Progress Notes (Signed)
Patient is a difficult IV start. As plan is to discharge in am per MD we can maintain the one IV site during the blood transfusion. Heparin drip will be paused and then immediately restarted once blood is finished. Gave first dose of coumadin as well as we try to bridge heparin and coumadin

## 2018-09-15 NOTE — Care Management Important Message (Signed)
Important Message  Patient Details  Name: Tina Barron MRN: [id number] Date of Birth: 04-10-1932   Medicare Important Message Given:  Yes     [name] 09/15/2018, 1:18 PM

## 2018-09-15 NOTE — Anesthesia Post-op Follow-up Note (Signed)
Anesthesia QCDR form completed.        

## 2018-09-15 NOTE — Consult Note (Signed)
[facility]  CARDIOLOGY CONSULT NOTE  Patient ID: Tina Barron MRN: [id number] DOB/AGE: 83/11/1932 85 y.o.  Admit date: 09/06/2018 Referring Physician [place] Primary Physician [place] Primary Cardiologist Gwen PoundsKowalski Reason for Consultation atrial fibrillation  HPI: 85 year old female referred for evaluation for anticoagulation for atrial fibrillation and valvular heart disease.  Presents with GI bleed on 09/06/2018.  Endoscopy unremarkable.  Colonoscopy revealed diverticulosis.  SPEP revealed proximal and mid jejunum bleeding angiectasias.  On admission, INR was 6.4.  According to the patient she recently took antibiotics.  Warfarin has been held.  Currently is on heparin drip till therapeutic SBE.  The patient has a history of paroxysmal atrial fibrillation dating to the mid 90s at which time she started warfarin therapy to prevent stroke.  She underwent mitral valve repair and tricuspid valve replacement 10/28/2006 by Dr. Romona CurlsMilano [facility].  Review of the records show that she received a Counselling psychologistaint Jude Biocor porcine bioprosthetic valve in the tricuspid position.  She also underwent left atrial Maze procedure.  Review of systems complete and found to be negative unless listed above     Past Medical History:  Diagnosis Date  . Anxiety and depression   . Atherosclerosis   . B12 deficiency   . Degenerative joint disease (DJD) of hip   . Fibromyalgia syndrome   . HH (hiatus hernia)   . Hypertension   . IBS (irritable bowel syndrome)   . Osteopenia   . Paroxysmal A-fib (HCC)   . Sinoatrial node dysfunction (HCC)    S/p pacemaker implant 04/17/08. Atrial lead revision 06/08/08    . Valvular heart disease    S/p tricuspid valve replacement with a 29mm bioprosthesis and mitral valve repair with St Jude [id number] and epicardial modified MAZE procedure on 10/28/06      Past Surgical History:  Procedure Laterality Date  . APPENDECTOMY  1952  . CARDIAC VALVE REPLACEMENT    . COLONOSCOPY N/A  09/10/2018   Procedure: COLONOSCOPY;  Surgeon: Wyline MoodAnna, Kiran, MD;  [facility];  Service: Gastroenterology;  Laterality: N/A;  . ESOPHAGOGASTRODUODENOSCOPY N/A 09/09/2018   Procedure: ESOPHAGOGASTRODUODENOSCOPY (EGD);  Surgeon: Toledo, Boykin Nearingeodoro K, MD;  Location: [facility] ENDOSCOPY;  Service: Gastroenterology;  Laterality: N/A;  . GIVENS CAPSULE STUDY N/A 09/13/2018   Procedure: GIVENS CAPSULE STUDY;  Surgeon: Toledo, Boykin Nearingeodoro K, MD;  Location: [facility] ENDOSCOPY;  Service: Gastroenterology;  Laterality: N/A;  . LUMBAR LAMINECTOMY  1989  . PARTIAL HYSTERECTOMY  1972  . PLACEMENT OF BREAST IMPLANTS  1976  . REPLACEMENT TOTAL HIP W/  RESURFACING IMPLANTS  2015    Medications Prior to Admission  Medication Sig Dispense Refill Last Dose  . acetaminophen (TYLENOL) 500 MG tablet Take 500 mg by mouth every 6 (six) hours as needed.   prn at prn  . losartan (COZAAR) 50 MG tablet Take 50 mg by mouth daily.    09/05/2018 at Unknown time  . metoprolol succinate (TOPROL-XL) 25 MG 24 hr tablet Take 12.5-25 mg by mouth 2 (two) times a day. Take 25 mg in the morning and 12.5 mg at bedtime   09/05/2018 at Unknown time  . MULTAQ 400 MG tablet Take 400 mg by mouth 2 (two) times daily with a meal.    09/05/2018 at Unknown time  . Multiple Vitamins-Minerals (MULTIVITAMIN PO) Take by mouth.   Past Week at Unknown time  . warfarin (COUMADIN) 2.5 MG tablet Take 2.5 mg by mouth one time only at 6 PM.    09/05/2018 at Unknown time   Social  History   Socioeconomic History  . Marital status: Married    Spouse name: Not on file  . Number of children: Not on file  . Years of education: Not on file  . Highest education level: Not on file  Occupational History  . Not on file  Social Needs  . Financial resource strain: Not on file  . Food insecurity    Worry: Not on file    Inability: Not on file  . Transportation needs    Medical: Not on file    Non-medical: Not on file  Tobacco Use  . Smoking status: Never Smoker   . Smokeless tobacco: Never Used  Substance and Sexual Activity  . Alcohol use: Not on file  . Drug use: Not on file  . Sexual activity: Not on file  Lifestyle  . Physical activity    Days per week: Not on file    Minutes per session: Not on file  . Stress: Not on file  Relationships  . Social Musicianconnections    Talks on phone: Not on file    Gets together: Not on file    Attends religious service: Not on file    Active member of club or organization: Not on file    Attends meetings of clubs or organizations: Not on file    Relationship status: Not on file  . Intimate partner violence    Fear of current or ex partner: Not on file    Emotionally abused: Not on file    Physically abused: Not on file    Forced sexual activity: Not on file  Other Topics Concern  . Not on file  Social History Narrative  . Not on file    Family History  Problem Relation Age of Onset  . Lung cancer Father   . Lung cancer Brother   . Breast cancer Daughter   . CVA Mother       Review of systems complete and found to be negative unless listed above      PHYSICAL EXAM  General: Well developed, well nourished, in no acute distress HEENT:  Normocephalic and atramatic Neck:  No JVD.  Lungs: Clear bilaterally to auscultation and percussion. Heart: HRRR . Normal S1 and S2 without gallops or murmurs.  Abdomen: Bowel sounds are positive, abdomen soft and non-tender  Msk:  Back normal, normal gait. Normal strength and tone for age. Extremities: No clubbing, cyanosis or edema.   Neuro: Alert and oriented X 3. Psych:  Good affect, responds appropriately  Labs:   Lab Results  Component Value Date   WBC 5.4 09/14/2018   HGB 8.3 (L) 09/14/2018   HCT 25.5 (L) 09/14/2018   MCV 101.7 (H) 09/14/2018   PLT 176 09/14/2018    Recent Labs  Lab 09/13/18 0440  NA 140  K 4.0  CL 110  CO2 26  BUN 11  CREATININE 1.16*  CALCIUM 8.4*  PROT 5.7*  BILITOT 0.7  ALKPHOS 36*  ALT 9  AST 16  GLUCOSE  80   Lab Results  Component Value Date   CKTOTAL 118 02/06/2014   TROPONINI < 0.02 07/17/2013   No results found for: CHOL No results found for: HDL No results found for: LDLCALC No results found for: TRIG No results found for: CHOLHDL No results found for: LDLDIRECT    Radiology: Ct Abdomen Pelvis W Contrast  Result Date: 09/02/2018 CLINICAL DATA:  Mid to lower abdominal pain and diarrhea 1 week. EXAM: CT ABDOMEN AND PELVIS  WITH CONTRAST TECHNIQUE: Multidetector CT imaging of the abdomen and pelvis was performed using the standard protocol following bolus administration of intravenous contrast. CONTRAST:  72mL OMNIPAQUE IOHEXOL 300 MG/ML  SOLN COMPARISON:  07/17/2013 FINDINGS: Lower chest: Lung bases are normal. Cardiac pacer leads are present. Hepatobiliary: Multiple hepatic cysts unchanged. Moderate cholelithiasis. Biliary tree is normal. Pancreas: Normal. Spleen: Normal. Adrenals/Urinary Tract: Adrenal glands are normal. Kidneys are normal in size without hydronephrosis or nephrolithiasis. Tiny subcentimeter hypodensity over the mid to lower pole cortex right kidney too small to characterize but likely a cyst. Subtle ill-defined low-attenuation over the mid to lower pole renal cortex bilaterally. No focal mass. No perinephric inflammation or fluid. Changes may be within normal although could be seen with mild pyelonephritis. Stomach/Bowel: Stomach and small bowel are normal. Previous appendectomy. Moderate diverticulosis of the colon most prominent over the sigmoid colon. Subtle stranding of the fat adjacent a sigmoid diverticula in the midline pelvis which could represent mild acute diverticulitis. Vascular/Lymphatic: Moderate calcified plaque over the abdominal aorta and iliac arteries. No adenopathy. Reproductive: Previous hysterectomy. Other: No significant free fluid.  No free peritoneal air. Musculoskeletal: Mild-to-moderate degenerate changes spinal multilevel disc disease over the lumbar  spine. Curvature of the lumbar spine convex right. Hardware over the left hip causing mild streak artifact over the pelvis. Mild degenerate change of the hips left worse than right. IMPRESSION: 1. Moderate diverticulosis of the colon with findings which may be due to mild acute sigmoid diverticulitis. No perforation or abscess. 2. Subtle ill-defined low-attenuation over the mid to lower pole cortex of the kidneys bilaterally which may be within normal, although could be seen in mild pyelonephritis. 3. Multiple hepatic cysts unchanged. Moderate cholelithiasis unchanged. 4. Subcentimeter right renal cortical hypodensity too small to characterize but likely a cyst. 5.  Aortic Atherosclerosis (ICD10-I70.0). Electronically Signed   By: Marin Olp M.D.   On: 09/02/2018 13:47   Nm Hepato W/eject Fract  Result Date: 09/12/2018 CLINICAL DATA:  Right upper quadrant pain. EXAM: NUCLEAR MEDICINE HEPATOBILIARY IMAGING WITH GALLBLADDER EF TECHNIQUE: Sequential images of the abdomen were obtained out to 60 minutes following intravenous administration of radiopharmaceutical. After oral ingestion of Ensure, gallbladder ejection fraction was determined. At 60 min, normal ejection fraction is greater than 33%. RADIOPHARMACEUTICALS:  5.439 mCi Tc-68m  Choletec IV COMPARISON:  Right upper quadrant ultrasound dated September 06, 2018. FINDINGS: Prompt uptake and biliary excretion of activity by the liver is seen. Gallbladder activity is visualized, consistent with patency of cystic duct. Biliary activity passes into small bowel, consistent with patent common bile duct. Calculated gallbladder ejection fraction is 99%. (Normal gallbladder ejection fraction with Ensure is greater than 33%.) IMPRESSION: Normal hepatobiliary scan. Electronically Signed   By: Titus Dubin M.D.   On: 09/12/2018 13:25   Dg Abd 2 Views  Result Date: 09/06/2018 CLINICAL DATA:  Abdominal pain. Black tarry stools for 1 week. Weakness. EXAM: ABDOMEN - 2 VIEW  COMPARISON:  CT abdomen and pelvis 09/02/2018 FINDINGS: No intraperitoneal free air is identified. Scattered gas is present in the colon and rectum. No dilated loops of bowel are seen to suggest obstruction. Pacemaker leads, breast implants, and proximal left femoral screws are noted. The lung bases are clear. Thoracolumbar spondylosis and levoscoliosis are noted. IMPRESSION: Nonobstructed bowel gas pattern. Electronically Signed   By: Logan Bores M.D.   On: 09/06/2018 15:29   US Abdomen Limited Ruq  Result Date: 09/06/2018 CLINICAL DATA:  Abdominal pain EXAM: ULTRASOUND ABDOMEN LIMITED RIGHT UPPER QUADRANT COMPARISON:  CT abdomen 09/02/2018 FINDINGS: Gallbladder: Multiple gallstones measuring up to 3 mm. No gallbladder wall thickening. Negative sonographic Murphy sign. Common bile duct: Diameter: 3.0 Liver: Multiple liver cysts as noted on CT. Calcified granuloma in the left lobe. Portal vein is patent on color Doppler imaging with normal direction of blood flow towards the liver. IMPRESSION: Cholelithiasis without cholecystitis or biliary dilatation Multiple hepatic cysts. Electronically Signed   By: Marlan Palauharles  Clark M.D.   On: 09/06/2018 14:12    EKG: Junctional rhythm at 63 bpm  ASSESSMENT AND PLAN:   1.  Paroxysmal atrial fibrillation, on warfarin for stroke prevention 2.  Status post mitral valve repair and bioprosthetic tricuspid valve 3.  GI bleed due to jejunum angioectasias the setting of INR 6.4 on warfarin after taking antibiotics  Recommendations  1.  Agree with current therapy 2.  Resume warfarin after resolution of GI bleed for stroke prevention in atrial fibrillation not for valvular heart disease since patient has bioprosthetic tricuspid valve.  Target INR 2.0-2.5, with close monitoring.   Signed: Marcina MillardAlexander Rilya Longo MD,PhD, [facility] 09/15/2018, 4:31 AM

## 2018-09-15 NOTE — Op Note (Signed)
[facility] Gastroenterology Patient Name: Tina CroftsGolda Galicia Procedure Date: 09/15/2018 12:53 PM MRN: [id number] Account #: [id number] Date of Birth: 03/09/1933 Admit Type: Outpatient Age: [age] Room: [facility] ENDO ROOM 3 Gender: Female Note Status: Finalized Procedure:            Small bowel enteroscopy Indications:          Arteriovenous malformation in the small intestine Providers:            Boykin Nearingeodoro K. Toledo MD, MD Medicines:            Propofol per Anesthesia Complications:        No immediate complications. Procedure:            Pre-Anesthesia Assessment:                       - The risks and benefits of the procedure and the                        sedation options and risks were discussed with the                        patient. All questions were answered and informed                        consent was obtained.                       - Patient identification and proposed procedure were                        verified prior to the procedure by the nurse. The                        procedure was verified in the procedure room.                       - ASA Grade Assessment: III - A patient with severe                        systemic disease.                       - After reviewing the risks and benefits, the patient                        was deemed in satisfactory condition to undergo the                        procedure.                       After obtaining informed consent, the endoscope was                        passed under direct vision. Throughout the procedure,                        the patient's blood pressure, pulse, and oxygen                        saturations were monitored continuously. The Endoscope  was introduced through the mouth, and advanced to the                        mid-jejunum. The Colonoscope was introduced through the                        and advanced to the. The small bowel enteroscopy was   accomplished without difficulty. The patient tolerated                        the procedure well. Findings:      The esophagus was normal.      The stomach was normal.      A single angioectasia with no bleeding was found in the second portion       of the duodenum. Coagulation for bleeding prevention using argon plasma       at 1 liter/minute and 20 watts was successful.      Multiple angioectasias with no bleeding were found in the proximal       jejunum. Coagulation for bleeding prevention using argon plasma at 1       liter/minute and 20 watts was successful. Estimated blood loss: none. Impression:           - Normal esophagus.                       - Normal stomach.                       - A single non-bleeding angioectasia in the duodenum.                        Treated with argon plasma coagulation (APC).                       - Multiple non-bleeding angioectasias in the jejunum.                        Treated with argon plasma coagulation (APC).                       - No specimens collected. Recommendation:       - Observe patient in hospital ward for possible                        discharge same day.                       - Return to my office PRN.                       - The findings and recommendations were discussed with                        the patient.                       - The findings and recommendations were discussed with                        the patient's primary physician. Procedure Code(s):    --- Professional ---  [id number], Small intestinal endoscopy, enteroscopy beyond                        second portion of duodenum, not including ileum; with                        control of bleeding (eg, injection, bipolar cautery,                        unipolar cautery, laser, heater probe, stapler, plasma                        coagulator) Diagnosis Code(s):    --- Professional ---                       K55.20, Angiodysplasia of colon without  hemorrhage                       K31.819, Angiodysplasia of stomach and duodenum without                        bleeding CPT copyright 2019 [employer]. All rights reserved. The codes documented in this report are preliminary and upon coder review may  be revised to meet current compliance requirements. Stanton Kidneyeodoro K Toledo MD, MD 09/15/2018 1:42:17 PM This report has been signed electronically. Number of Addenda: 0 Note Initiated On: 09/15/2018 12:53 PM Estimated Blood Loss: Estimated blood loss: none.      [facility]

## 2018-09-16 LAB — TYPE AND SCREEN
ABO/RH(D): O POS
Antibody Screen: NEGATIVE
Unit division: 0

## 2018-09-16 LAB — PROTIME-INR
INR: 1.5 — ABNORMAL HIGH (ref 0.8–1.2)
Prothrombin Time: 17.9 seconds — ABNORMAL HIGH (ref 11.4–15.2)

## 2018-09-16 LAB — BPAM RBC
Blood Product Expiration Date: 202007292359
ISSUE DATE / TIME: 202007021755
Unit Type and Rh: 5100

## 2018-09-16 LAB — HEMOGLOBIN AND HEMATOCRIT, BLOOD
HCT: 25.9 % — ABNORMAL LOW (ref 36.0–46.0)
Hemoglobin: 8.5 g/dL — ABNORMAL LOW (ref 12.0–15.0)

## 2018-09-16 MED ORDER — PANTOPRAZOLE SODIUM 40 MG PO TBEC: 40.0000 mg | DELAYED_RELEASE_TABLET | Freq: Two times a day (BID) | ORAL | 0 refills | Status: DC

## 2018-09-16 MED ORDER — LOPERAMIDE HCL 2 MG PO CAPS
2.0000 mg | ORAL_CAPSULE | ORAL | Status: DC | PRN
  Administered 2018-09-16: 2 mg via ORAL
  Filled 2018-09-16: qty 1

## 2018-09-16 MED ORDER — WARFARIN SODIUM 5 MG PO TABS
5.0000 mg | ORAL_TABLET | Freq: Once | ORAL | Status: DC
  Filled 2018-09-16: qty 1

## 2018-09-16 NOTE — Discharge Summary (Signed)
[facility] - [place] [facility]   PATIENT NAME: Tina Barron Sudberry    MR#:  [id number]  DATE OF BIRTH:  01/27/1933  DATE OF ADMISSION:  09/06/2018 ADMITTING PHYSICIAN: Alford Highlandichard Wieting, MD  DATE OF DISCHARGE: 09/16/2018  PRIMARY CARE PHYSICIAN: Patrice ParadiseMcLaughlin, Miriam K, MD   ADMISSION DIAGNOSIS:  Elevated INR [R79.1] Abdominal pain [R10.9] Gastrointestinal hemorrhage, unspecified gastrointestinal hemorrhage type [K92.2]  DISCHARGE DIAGNOSIS:  Active Problems:   GI bleed   Right upper quadrant abdominal pain Cholelithiasis Coagulopathy Paroxysmal atrial fibrillation Diverticular disease Acute kidney injury History of mitral valve repair and bioprosthetic tricuspid valve  SECONDARY DIAGNOSIS:   Past Medical History:  Diagnosis Date  . Anxiety and depression   . Atherosclerosis   . B12 deficiency   . Degenerative joint disease (DJD) of hip   . Fibromyalgia syndrome   . HH (hiatus hernia)   . Hypertension   . IBS (irritable bowel syndrome)   . Osteopenia   . Paroxysmal A-fib (HCC)   . Sinoatrial node dysfunction (HCC)    S/p pacemaker implant 04/17/08. Atrial lead revision 06/08/08    . Valvular heart disease    S/p tricuspid valve replacement with a 29mm bioprosthesis and mitral valve repair with St Jude SARP26 and epicardial modified MAZE procedure on 10/28/06       ADMITTING HISTORY Tina Barron Dantes  is a 85 y.o. female with a known history of came in with black stools going on for a week.  Started as diarrhea.  She saw her PMD and they put her on sulfa and Flagyl for possible diverticulitis.  She had 3-4 black episodes of bowel movements yesterday and 1 today.  She is feeling very weak and very poor appetite.  She is lost 8 to 10 pounds.  In the ER she was found to be anemic with a hemoglobin of 9 3 and her INR was up at 6.4 and hospitalist services were contacted for further evaluation.  HOSPITAL COURSE:  Patient was admitted to medical floor.. Started on IV  Protonix and serial hemoglobins were monitored.  Coumadin was held and vitamin K was given for coagulopathy.  Patient was evaluated by gastroenterology during hospitalization.  COVID-19 test was negative. Patient had endoscopy and intervention during the hospitalization.  Initial colonoscopy done on September 10, 2018 was normal and endoscopy was also normal.  She was further worked up by enteroscopy by GI and a small nonbleeding angiectasia in the duodenum was noted.  It was taken care of by argon plasma coagulation.  Patient tolerated procedure well.  She was again started back on anticoagulation with heparin drip and Coumadin.  She will continue Coumadin as outpatient and follow-up with GI in the clinic.  Patient will be discharged on oral proton pump inhibitor.  Cardiology is advised to resume back warfarin for anticoagulation. CONSULTS OBTAINED:  Treatment Team:  Marcina MillardParaschos, Alexander, MD  Gastroenterology consult  DRUG ALLERGIES:   Allergies  Allergen Reactions  . Amlodipine Itching  . Diltiazem Hcl Hives  . Duloxetine Other (See Comments)    Hyperactivity   . Other Other (See Comments) and Hives    Uncoded Allergy. Allergen: Shellfish Uncoded Allergy. Allergen: Msg  . Penicillin G Itching  . Shellfish Allergy   . Doxycycline     Blurred vision  . Hydrocortisone Rash  . Prednisone Rash    DISCHARGE MEDICATIONS:   Allergies as of 09/16/2018      Reactions   Amlodipine Itching   Diltiazem Hcl Hives   Duloxetine Other (  See Comments)   Hyperactivity    Other Other (See Comments), Hives   Uncoded Allergy. Allergen: Shellfish Uncoded Allergy. Allergen: Msg   Penicillin G Itching   Shellfish Allergy    Doxycycline    Blurred vision   Hydrocortisone Rash   Prednisone Rash      Medication List    TAKE these medications   losartan 50 MG tablet Commonly known as: COZAAR Take 50 mg by mouth daily.   metoprolol succinate 25 MG 24 hr tablet Commonly known as: TOPROL-XL Take  12.5-25 mg by mouth 2 (two) times a day. Take 25 mg in the morning and 12.5 mg at bedtime   Multaq 400 MG tablet Generic drug: dronedarone Take 400 mg by mouth 2 (two) times daily with a meal.   MULTIVITAMIN PO Take by mouth.   pantoprazole 40 MG tablet Commonly known as: PROTONIX Take 1 tablet (40 mg total) by mouth 2 (two) times daily.   TYLENOL 500 MG tablet Generic drug: acetaminophen Take 500 mg by mouth every 6 (six) hours as needed.   warfarin 2.5 MG tablet Commonly known as: COUMADIN Take 2.5 mg by mouth one time only at 6 PM.       Today  Patient seen today No new episodes of black stools or vomiting of blood Tolerating diet well Hemodynamically stable VITAL SIGNS:  Blood pressure 118/71, pulse 82, temperature 98 F (36.7 C), temperature source Oral, resp. rate 18, height 5\' 7"  (1.702 m), weight 58.7 kg, SpO2 96 %.  I/O:    Intake/Output Summary (Last 24 hours) at 09/16/2018 1328 Last data filed at 09/16/2018 1139 Gross per 24 hour  Intake 909.17 ml  Output 300 ml  Net 609.17 ml    PHYSICAL EXAMINATION:  Physical Exam  GENERAL:  85 y.o.-year-old patient lying in the bed with no acute distress.  LUNGS: Normal breath sounds bilaterally, no wheezing, rales,rhonchi or crepitation. No use of accessory muscles of respiration.  CARDIOVASCULAR: S1, S2 normal. No murmurs, rubs, or gallops.  ABDOMEN: Soft, non-tender, non-distended. Bowel sounds present. No organomegaly or mass.  NEUROLOGIC: Moves all 4 extremities. PSYCHIATRIC: The patient is alert and oriented x 3.  SKIN: No obvious rash, lesion, or ulcer.   DATA REVIEW:   CBC Recent Labs  Lab 09/15/18 0641 09/16/18 0442  WBC 6.2  --   HGB 7.4* 8.5*  HCT 23.2* 25.9*  PLT 159  --     Chemistries  Recent Labs  Lab 09/13/18 0440  NA 140  K 4.0  CL 110  CO2 26  GLUCOSE 80  BUN 11  CREATININE 1.16*  CALCIUM 8.4*  AST 16  ALT 9  ALKPHOS 36*  BILITOT 0.7    Cardiac Enzymes No results for  input(s): TROPONINI in the last 168 hours.  Microbiology Results  Results for orders placed or performed during the hospital encounter of 09/06/18  SARS Coronavirus 2 (CEPHEID - Performed in [place] hospital lab), Hosp Order     Status: None   Collection Time: 09/06/18 12:32 PM   Specimen: Nasopharyngeal Swab  Result Value Ref Range Status   SARS Coronavirus 2 NEGATIVE NEGATIVE Final    Comment: (NOTE) If result is NEGATIVE SARS-CoV-2 target nucleic acids are NOT DETECTED. The SARS-CoV-2 RNA is generally detectable in upper and lower  respiratory specimens during the acute phase of infection. The lowest  concentration of SARS-CoV-2 viral copies this assay can detect is 250  copies / mL. A negative result does not preclude SARS-CoV-2  infection  and should not be used as the sole basis for treatment or other  patient management decisions.  A negative result may occur with  improper specimen collection / handling, submission of specimen other  than nasopharyngeal swab, presence of viral mutation(s) within the  areas targeted by this assay, and inadequate number of viral copies  (<250 copies / mL). A negative result must be combined with clinical  observations, patient history, and epidemiological information. If result is POSITIVE SARS-CoV-2 target nucleic acids are DETECTED. The SARS-CoV-2 RNA is generally detectable in upper and lower  respiratory specimens dur ing the acute phase of infection.  Positive  results are indicative of active infection with SARS-CoV-2.  Clinical  correlation with patient history and other diagnostic information is  necessary to determine patient infection status.  Positive results do  not rule out bacterial infection or co-infection with other viruses. If result is PRESUMPTIVE POSTIVE SARS-CoV-2 nucleic acids MAY BE PRESENT.   A presumptive positive result was obtained on the submitted specimen  and confirmed on repeat testing.  While 2019 novel  coronavirus  (SARS-CoV-2) nucleic acids may be present in the submitted sample  additional confirmatory testing may be necessary for epidemiological  and / or clinical management purposes  to differentiate between  SARS-CoV-2 and other Sarbecovirus currently known to infect humans.  If clinically indicated additional testing with an alternate test  methodology [phone]([id number]) is advised. The SARS-CoV-2 RNA is generally  detectable in upper and lower respiratory sp ecimens during the acute  phase of infection. The expected result is Negative. Fact Sheet for Patients:  BoilerBrush.com.cyhttps://www.fda.gov/media/136312/download Fact Sheet for Healthcare Providers: https://pope.com/https://www.fda.gov/media/136313/download This test is not yet approved or cleared by the [place] FDA and has been authorized for detection and/or diagnosis of SARS-CoV-2 by FDA under an Emergency Use Authorization (EUA).  This EUA will remain in effect (meaning this test can be used) for the duration of the COVID-19 declaration under Section 564(b)(1) of the Act, 21 U.S.C. section 360bbb-3(b)(1), unless the authorization is terminated or revoked sooner. Performed at [facility], [address] [place], [place] [address]     RADIOLOGY:  No results found.  Follow up with PCP in 1 week.  Management plans discussed with the patient, family and they are in agreement.  CODE STATUS: Full code    Code Status Orders  (From admission, onward)         Start     Ordered   09/06/18 1321  Full code  Continuous     09/06/18 1321        Code Status History    This patient has a current code status but no historical code status.   Advance Care Planning Activity      TOTAL TIME TAKING CARE OF THIS PATIENT ON DAY OF DISCHARGE: more than 35 minutes.   Ihor AustinPavan Pyreddy M.D on 09/16/2018 at 1:28 PM  Between 7am to 6pm - Pager - [phone]  After 6pm go to [email or web address] - password [email or web address] [email or web address]  [facility]  Office   [phone]  CC: Primary care physician; Patrice ParadiseMcLaughlin, Miriam K, MD  Note: This dictation was prepared with Dragon dictation along with smaller phrase technology. Any transcriptional errors that result from this process are unintentional.

## 2018-09-16 NOTE — Progress Notes (Signed)
Tina Barron to be D/C'd home per MD order.  Discussed prescriptions and follow up appointments with the patient. Prescriptions given to patient, medication list explained in detail. Pt verbalized understanding.  Allergies as of 09/16/2018      Reactions   Amlodipine Itching   Diltiazem Hcl Hives   Duloxetine Other (See Comments)   Hyperactivity    Other Other (See Comments), Hives   Uncoded Allergy. Allergen: Shellfish Uncoded Allergy. Allergen: Msg   Penicillin G Itching   Shellfish Allergy    Doxycycline    Blurred vision   Hydrocortisone Rash   Prednisone Rash      Medication List    TAKE these medications   losartan 50 MG tablet Commonly known as: COZAAR Take 50 mg by mouth daily.   metoprolol succinate 25 MG 24 hr tablet Commonly known as: TOPROL-XL Take 12.5-25 mg by mouth 2 (two) times a day. Take 25 mg in the morning and 12.5 mg at bedtime   Multaq 400 MG tablet Generic drug: dronedarone Take 400 mg by mouth 2 (two) times daily with a meal.   MULTIVITAMIN PO Take by mouth.   pantoprazole 40 MG tablet Commonly known as: PROTONIX Take 1 tablet (40 mg total) by mouth 2 (two) times daily.   TYLENOL 500 MG tablet Generic drug: acetaminophen Take 500 mg by mouth every 6 (six) hours as needed.   warfarin 2.5 MG tablet Commonly known as: COUMADIN Take 2.5 mg by mouth one time only at 6 PM.       Vitals:   09/16/18 0647 09/16/18 0959  BP: (!) 121/57 118/71  Pulse: 85 82  Resp: 18   Temp: 98 F (36.7 C)   SpO2: 96%     Skin clean, dry and intact without evidence of skin break down, no evidence of skin tears noted. IV catheter discontinued intact. Site without signs and symptoms of complications. Dressing and pressure applied. Pt denies pain at this time. No complaints noted.  An After Visit Summary was printed and given to the patient. Patient escorted via [employer], and D/C home via private auto.  Chuck Hint RN [facility] [employer]

## 2018-09-28 ENCOUNTER — Encounter: Payer: Self-pay | Admitting: Gastroenterology

## 2018-10-03 DIAGNOSIS — K5521 Angiodysplasia of colon with hemorrhage: Secondary | ICD-10-CM | POA: Insufficient documentation

## 2018-10-12 ENCOUNTER — Other Ambulatory Visit
Admission: RE | Admit: 2018-10-12 | Discharge: 2018-10-12 | Disposition: A | Discharge status: Home / Self Care | Payer: Medicare Other | Source: Ambulatory Visit | Attending: Internal Medicine | Admitting: Internal Medicine

## 2018-10-12 DIAGNOSIS — R197 Diarrhea, unspecified: Secondary | ICD-10-CM | POA: Insufficient documentation

## 2018-10-12 LAB — GASTROINTESTINAL PANEL BY PCR, STOOL (REPLACES STOOL CULTURE)
Adenovirus F40/41: NOT DETECTED
Astrovirus: NOT DETECTED
Campylobacter species: NOT DETECTED
Cryptosporidium: NOT DETECTED
Cyclospora cayetanensis: NOT DETECTED
E. coli O157: NOT DETECTED
Entamoeba histolytica: NOT DETECTED
Enteroaggregative E coli (EAEC): NOT DETECTED
Enterotoxigenic E coli (ETEC): DETECTED — AB
Giardia lamblia: NOT DETECTED
Norovirus GI/GII: NOT DETECTED
Plesimonas shigelloides: NOT DETECTED
Rotavirus A: NOT DETECTED
Salmonella species: NOT DETECTED
Sapovirus (I, II, IV, and V): NOT DETECTED
Shiga like toxin producing E coli (STEC): DETECTED — AB
Shigella/Enteroinvasive E coli (EIEC): NOT DETECTED
Vibrio cholerae: NOT DETECTED
Vibrio species: NOT DETECTED
Yersinia enterocolitica: NOT DETECTED

## 2018-10-12 LAB — CLOSTRIDIUM DIFFICILE BY PCR, REFLEXED: Toxigenic C. Difficile by PCR: POSITIVE — AB

## 2018-10-12 LAB — C DIFFICILE QUICK SCREEN W PCR REFLEX
C Diff antigen: POSITIVE — AB
C Diff toxin: NEGATIVE

## 2018-10-21 ENCOUNTER — Ambulatory Visit (INDEPENDENT_AMBULATORY_CARE_PROVIDER_SITE_OTHER): Payer: Medicare Other | Admitting: Vascular Surgery

## 2018-10-21 ENCOUNTER — Encounter (INDEPENDENT_AMBULATORY_CARE_PROVIDER_SITE_OTHER): Payer: Medicare Other

## 2018-11-07 LAB — MISCELLANEOUS TEST

## 2018-11-25 ENCOUNTER — Emergency Department
Admission: EM | Admit: 2018-11-25 | Discharge: 2018-11-25 | Disposition: A | Discharge status: Home / Self Care | Payer: Medicare Other | Attending: Student in an Organized Health Care Education/Training Program | Admitting: Student in an Organized Health Care Education/Training Program

## 2018-11-25 ENCOUNTER — Emergency Department: Payer: Medicare Other

## 2018-11-25 ENCOUNTER — Other Ambulatory Visit: Payer: Self-pay

## 2018-11-25 DIAGNOSIS — Z950 Presence of cardiac pacemaker: Secondary | ICD-10-CM | POA: Diagnosis not present

## 2018-11-25 DIAGNOSIS — Z7901 Long term (current) use of anticoagulants: Secondary | ICD-10-CM | POA: Insufficient documentation

## 2018-11-25 DIAGNOSIS — Z952 Presence of prosthetic heart valve: Secondary | ICD-10-CM | POA: Diagnosis not present

## 2018-11-25 DIAGNOSIS — Z79899 Other long term (current) drug therapy: Secondary | ICD-10-CM | POA: Insufficient documentation

## 2018-11-25 DIAGNOSIS — I10 Essential (primary) hypertension: Secondary | ICD-10-CM | POA: Insufficient documentation

## 2018-11-25 DIAGNOSIS — R51 Headache: Secondary | ICD-10-CM | POA: Insufficient documentation

## 2018-11-25 DIAGNOSIS — Z96649 Presence of unspecified artificial hip joint: Secondary | ICD-10-CM | POA: Diagnosis not present

## 2018-11-25 DIAGNOSIS — R519 Headache, unspecified: Secondary | ICD-10-CM

## 2018-11-25 LAB — CBC WITH DIFFERENTIAL/PLATELET
Abs Immature Granulocytes: 0.08 10*3/uL — ABNORMAL HIGH (ref 0.00–0.07)
Basophils Absolute: 0 10*3/uL (ref 0.0–0.1)
Basophils Relative: 1 %
Eosinophils Absolute: 0.1 10*3/uL (ref 0.0–0.5)
Eosinophils Relative: 2 %
HCT: 34.2 % — ABNORMAL LOW (ref 36.0–46.0)
Hemoglobin: 10.9 g/dL — ABNORMAL LOW (ref 12.0–15.0)
Immature Granulocytes: 2 %
Lymphocytes Relative: 25 %
Lymphs Abs: 1.1 10*3/uL (ref 0.7–4.0)
MCH: 31.8 pg (ref 26.0–34.0)
MCHC: 31.9 g/dL (ref 30.0–36.0)
MCV: 99.7 fL (ref 80.0–100.0)
Monocytes Absolute: 0.3 10*3/uL (ref 0.1–1.0)
Monocytes Relative: 7 %
Neutro Abs: 2.8 10*3/uL (ref 1.7–7.7)
Neutrophils Relative %: 63 %
Platelets: 188 10*3/uL (ref 150–400)
RBC: 3.43 MIL/uL — ABNORMAL LOW (ref 3.87–5.11)
RDW: 16.3 % — ABNORMAL HIGH (ref 11.5–15.5)
WBC: 4.4 10*3/uL (ref 4.0–10.5)
nRBC: 0 % (ref 0.0–0.2)

## 2018-11-25 LAB — BASIC METABOLIC PANEL
Anion gap: 9 (ref 5–15)
BUN: 19 mg/dL (ref 8–23)
CO2: 24 mmol/L (ref 22–32)
Calcium: 9.6 mg/dL (ref 8.9–10.3)
Chloride: 109 mmol/L (ref 98–111)
Creatinine, Ser: 1.04 mg/dL — ABNORMAL HIGH (ref 0.44–1.00)
GFR calc Af Amer: 57 mL/min — ABNORMAL LOW (ref >60)
GFR calc non Af Amer: 49 mL/min — ABNORMAL LOW (ref >60)
Glucose, Bld: 94 mg/dL (ref 70–99)
Potassium: 4.1 mmol/L (ref 3.5–5.1)
Sodium: 142 mmol/L (ref 135–145)

## 2018-11-25 LAB — C-REACTIVE PROTEIN: CRP: <0.8 mg/dL (ref <1.0)

## 2018-11-25 LAB — SEDIMENTATION RATE: Sed Rate: 43 mm/hr — ABNORMAL HIGH (ref 0–30)

## 2018-11-25 MED ORDER — PREDNISONE 20 MG PO TABS: 20.0000 mg | ORAL_TABLET | Freq: Every day | ORAL | 0 refills | Status: AC

## 2018-11-25 MED ORDER — TRAMADOL HCL 50 MG PO TABS: 50.0000 mg | ORAL_TABLET | Freq: Two times a day (BID) | ORAL | 0 refills | Status: AC | PRN

## 2018-11-25 MED ORDER — VALACYCLOVIR HCL 500 MG PO TABS: 1000.0000 mg | ORAL_TABLET | Freq: Three times a day (TID) | ORAL | 0 refills | Status: AC

## 2018-11-25 MED ORDER — ACETAMINOPHEN 500 MG PO TABS
1000.0000 mg | ORAL_TABLET | Freq: Once | ORAL | Status: DC
  Filled 2018-11-25: qty 2

## 2018-11-25 NOTE — ED Notes (Signed)
Contacted niece for ride home

## 2018-11-25 NOTE — Discharge Instructions (Signed)

## 2018-11-25 NOTE — ED Notes (Signed)
Patient transported to CT 

## 2018-11-25 NOTE — ED Provider Notes (Signed)
[facility] Emergency Department Provider Note    First MD Initiated Contact with Patient 11/25/18 1751     (approximate)  I have reviewed the triage vital signs and the nursing notes.   HISTORY  Chief Complaint Facial Pain    HPI Tina Barron is a 85 y.o. female plosive past medical history presents the ER for evaluation of right-sided facial pain that is been ongoing for the past several weeks.  Denies any fevers.  No numbness or tingling.  No blurry vision.  Was not sudden onset.  States that she had similar pain in the past.  Cannot remember what was called but thinks it may have been shingles but also says that trigeminal neuralgia also sounds familiar.  States that the pain is intermittent and a severe shooting pain.  States that she does have some pain around an area on the roof of her mouth.  No change in her taste.  No change in smell.  Denies any ear pain.  No trouble swallowing.    Past Medical History:  Diagnosis Date   Anxiety and depression    Atherosclerosis    B12 deficiency    Degenerative joint disease (DJD) of hip    Fibromyalgia syndrome    HH (hiatus hernia)    Hypertension    IBS (irritable bowel syndrome)    Osteopenia    Paroxysmal A-fib (HCC)    Sinoatrial node dysfunction (HCC)    S/p pacemaker implant 04/17/08. Atrial lead revision 06/08/08     Valvular heart disease    S/p tricuspid valve replacement with a 58m bioprosthesis and mitral valve repair with St Jude SARP26 and epicardial modified MAZE procedure on 10/28/06     Family History  Problem Relation Age of Onset   Lung cancer Father    Lung cancer Brother    Breast cancer Daughter    CVA Mother    Past Surgical History:  Procedure Laterality Date   APPENDECTOMY  1952   CARDIAC VALVE REPLACEMENT     COLONOSCOPY N/A 09/10/2018   Procedure: COLONOSCOPY;  Surgeon: AJonathon Bellows MD;  [facility];  Service: Gastroenterology;  Laterality:  N/A;   ESOPHAGOGASTRODUODENOSCOPY N/A 09/09/2018   Procedure: ESOPHAGOGASTRODUODENOSCOPY (EGD);  Surgeon: Toledo, TBenay Pike MD;  Location: [facility] ENDOSCOPY;  Service: Gastroenterology;  Laterality: N/A;   ESOPHAGOGASTRODUODENOSCOPY N/A 09/15/2018   Procedure: ESOPHAGOGASTRODUODENOSCOPY (EGD);  Surgeon: Toledo, TBenay Pike MD;  Location: [facility] ENDOSCOPY;  Service: Gastroenterology;  Laterality: N/A;   GIVENS CAPSULE STUDY N/A 09/13/2018   Procedure: GIVENS CAPSULE STUDY;  Surgeon: Toledo, TBenay Pike MD;  Location: [facility] ENDOSCOPY;  Service: Gastroenterology;  Laterality: N/A;   LUMBAR LAMINECTOMY  1989   PARTIAL HYSTERECTOMY  1972   PLACEMENT OF BREAST IMPLANTS  1976   REPLACEMENT TOTAL HIP W/  RESURFACING IMPLANTS  2015   Patient Active Problem List   Diagnosis Date Noted   Right upper quadrant abdominal pain    GI bleed 09/06/2018   Iron deficiency anemia 04/01/2017   Essential hypertension, benign 08/21/2016   Leg pain 08/21/2016   [place]ortic atherosclerosis ([place] 08/21/2016   Anemia 01/22/2016   Atrial fibrillation ([place] 01/22/2016   Paroxysmal A-fib ([place] 11/06/2014      Prior to Admission medications   Medication Sig Start Date End Date Taking? Authorizing Provider  acetaminophen (TYLENOL) 500 MG tablet Take 500 mg by mouth every 6 (six) hours as needed.    [provider]  losartan (COZAAR) 50 MG tablet Take 50  mg by mouth daily.     [provider]  metoprolol succinate (TOPROL-XL) 25 MG 24 hr tablet Take 12.5-25 mg by mouth 2 (two) times a day. Take 25 mg in the morning and 12.5 mg at bedtime 12/13/15   [provider]  MULTAQ 400 MG tablet Take 400 mg by mouth 2 (two) times daily with a meal.  12/17/15   [provider]  Multiple Vitamins-Minerals (MULTIVITAMIN PO) Take by mouth.    [provider]  pantoprazole (PROTONIX) 40 MG tablet Take 1 tablet (40 mg total) by mouth 2 (two) times daily. 09/16/18 10/16/18  Saundra Shelling, MD    predniSONE (DELTASONE) 20 MG tablet Take 1 tablet (20 mg total) by mouth daily for 5 days. 11/25/18 11/30/18  Merlyn Lot, MD  traMADol (ULTRAM) 50 MG tablet Take 1 tablet (50 mg total) by mouth every 12 (twelve) hours as needed for up to 6 days for severe pain. 11/25/18 12/01/18  Merlyn Lot, MD  valACYclovir (VALTREX) 500 MG tablet Take 2 tablets (1,000 mg total) by mouth 3 (three) times daily for 5 days. 11/25/18 11/30/18  Merlyn Lot, MD  warfarin (COUMADIN) 2.5 MG tablet Take 2.5 mg by mouth one time only at 6 PM.  07/09/15   [provider]    Allergies Amlodipine, Diltiazem hcl, Duloxetine, Other, Penicillin g, Shellfish allergy, Doxycycline, Hydrocortisone, and Prednisone    Social History Social History   Tobacco Use   Smoking status: Never Smoker   Smokeless tobacco: Never Used  Substance Use Topics   Alcohol use: Not on file   Drug use: Not on file    Review of Systems Patient denies headaches, rhinorrhea, blurry vision, numbness, shortness of breath, chest pain, edema, cough, abdominal pain, nausea, vomiting, diarrhea, dysuria, fevers, rashes or hallucinations unless otherwise stated above in HPI. ____________________________________________   PHYSICAL EXAM:  VITAL SIGNS: Vitals:   11/25/18 1332 11/25/18 2028  BP: (!) 163/77 (!) 154/76  Pulse: 78 68  Resp: 16 16  Temp: 98.2 F (36.8 C) 98 F (36.7 C)  SpO2: 100% 100%    Constitutional: Alert and oriented.  Eyes: Conjunctivae are normal.  Head: Atraumatic.  Upper arteries are nontender.  Pain not reproduced with caution to anterior right ear. Nose: No congestion/rhinnorhea. Mouth/Throat: Mucous membranes are moist.  Small tender vesicle on the right hard palate. Neck: No stridor. Painless ROM.  Cardiovascular: Normal rate, regular rhythm. Grossly normal heart sounds.  Good peripheral circulation. Respiratory: Normal respiratory effort.  No retractions. Lungs CTAB. Gastrointestinal:  Soft and nontender. No distention. No abdominal bruits. No CVA tenderness. Genitourinary:  Musculoskeletal: No lower extremity tenderness nor edema.  No joint effusions. Neurologic:  CN- intact.  No facial droop, Normal FNF.  Normal heel to shin.  Sensation intact bilaterally. Normal speech and language. No gross focal neurologic deficits are appreciated. No gait instability. Skin:  Skin is warm, dry and intact. No rash noted. Psychiatric: Mood and affect are normal. Speech and behavior are normal.  ____________________________________________   LABS (all labs ordered are listed, but only abnormal results are displayed)  Results for orders placed or performed during the hospital encounter of 11/25/18 (from the past 24 hour(s))  Sedimentation rate     Status: Abnormal   Collection Time: 11/25/18  5:10 PM  Result Value Ref Range   Sed Rate 43 (H) 0 - 30 mm/hr  CBC with Differential     Status: Abnormal   Collection Time: 11/25/18  5:10 PM  Result Value  Ref Range   WBC 4.4 4.0 - 10.5 K/uL   RBC 3.43 (L) 3.87 - 5.11 MIL/uL   Hemoglobin 10.9 (L) 12.0 - 15.0 g/dL   HCT 34.2 (L) 36.0 - 46.0 %   MCV 99.7 80.0 - 100.0 fL   MCH 31.8 26.0 - 34.0 pg   MCHC 31.9 30.0 - 36.0 g/dL   RDW 16.3 (H) 11.5 - 15.5 %   Platelets 188 150 - 400 K/uL   nRBC 0.0 0.0 - 0.2 %   Neutrophils Relative % 63 %   Neutro Abs 2.8 1.7 - 7.7 K/uL   Lymphocytes Relative 25 %   Lymphs Abs 1.1 0.7 - 4.0 K/uL   Monocytes Relative 7 %   Monocytes Absolute 0.3 0.1 - 1.0 K/uL   Eosinophils Relative 2 %   Eosinophils Absolute 0.1 0.0 - 0.5 K/uL   Basophils Relative 1 %   Basophils Absolute 0.0 0.0 - 0.1 K/uL   Immature Granulocytes 2 %   Abs Immature Granulocytes 0.08 (H) 0.00 - 0.07 K/uL  Basic metabolic panel     Status: Abnormal   Collection Time: 11/25/18  7:47 PM  Result Value Ref Range   Sodium 142 135 - 145 mmol/L   Potassium 4.1 3.5 - 5.1 mmol/L   Chloride 109 98 - 111 mmol/L   CO2 24 22 - 32 mmol/L    Glucose, Bld 94 70 - 99 mg/dL   BUN 19 8 - 23 mg/dL   Creatinine, Ser 1.04 (H) 0.44 - 1.00 mg/dL   Calcium 9.6 8.9 - 10.3 mg/dL   GFR calc non Af Amer 49 (L) >60 mL/min   GFR calc Af Amer 57 (L) >60 mL/min   Anion gap 9 5 - 15   ____________________________________________ ____________________________________________  RADIOLOGY  I personally reviewed all radiographic images ordered to evaluate for the above acute complaints and reviewed radiology reports and findings.  These findings were personally discussed with the patient.  Please see medical record for radiology report.  ____________________________________________   PROCEDURES  Procedure(s) performed:  Procedures    Critical Care performed: no ____________________________________________   INITIAL IMPRESSION / ASSESSMENT AND PLAN / ED COURSE  Pertinent labs & imaging results that were available during my care of the patient were reviewed by me and considered in my medical decision making (see chart for details).   DDX: trigeminal neuralgia, migraine, cluster, shingles, mass, gca  Tina Barron is a 85 y.o. who presents to the ED with intermittent right-sided facial pain as described above.  No focal neuro deficits.  Temporal arteries are soft and benign.  Does not seem consistent with infectious process such as meningitis or encephalitis.  CT imaging was ordered to exclude mass.  CT imaging is reassuring.  Does have borderline elevated ESR though may be her baseline given her age.  Possible trigeminal neuralgia.  Not having significant pain at this time.  Given the 1 vesicular lesion on the right side of her palate may be early shingles.  Will be treated with pain medication steroids and valCyclovir.  Discussed follow-up with PCP as well as neurology.     The patient was evaluated in Emergency Department today for the symptoms described in the history of present illness. He/she was evaluated in the context of the global  COVID-19 pandemic, which necessitated consideration that the patient might be at risk for infection with the SARS-CoV-2 virus that causes COVID-19. Institutional protocols and algorithms that pertain to the evaluation of patients at risk for  COVID-19 are in a state of rapid change based on information released by regulatory bodies including the CDC and federal and state organizations. These policies and algorithms were followed during the patient's care in the ED.  As part of my medical decision making, I reviewed the following data within the [facility] notes reviewed and incorporated, Labs reviewed, notes from prior ED visits and [place] Controlled Substance Database   ____________________________________________   FINAL CLINICAL IMPRESSION(S) / ED DIAGNOSES  Final diagnoses:  Right sided facial pain      NEW MEDICATIONS STARTED DURING THIS VISIT:  Discharge Medication List as of 11/25/2018  8:26 PM    START taking these medications   Details  predniSONE (DELTASONE) 20 MG tablet Take 1 tablet (20 mg total) by mouth daily for 5 days., Starting Fri 11/25/2018, Until Wed 11/30/2018, Normal    traMADol (ULTRAM) 50 MG tablet Take 1 tablet (50 mg total) by mouth every 12 (twelve) hours as needed for up to 6 days for severe pain., Starting Fri 11/25/2018, Until Thu 12/01/2018, Normal    valACYclovir (VALTREX) 500 MG tablet Take 2 tablets (1,000 mg total) by mouth 3 (three) times daily for 5 days., Starting Fri 11/25/2018, Until Wed 11/30/2018, Normal         Note:  This document was prepared using Dragon voice recognition software and may include unintentional dictation errors.    Merlyn Lot, MD 11/25/18 2048

## 2018-11-25 NOTE — ED Triage Notes (Signed)
Reports pain starting in right ear and radiating to right side of nose and upper right side of mouth. Pt hx of same but cannot find word. Possibly trigeminal neuralgia. Pt alert and oriented X4, cooperative, RR even and unlabored, color WNL. Pt in NAD.

## 2018-12-07 ENCOUNTER — Encounter: Payer: Self-pay | Admitting: Gastroenterology

## 2018-12-13 ENCOUNTER — Ambulatory Visit (INDEPENDENT_AMBULATORY_CARE_PROVIDER_SITE_OTHER): Payer: Medicare Other | Admitting: Vascular Surgery

## 2018-12-13 ENCOUNTER — Encounter (INDEPENDENT_AMBULATORY_CARE_PROVIDER_SITE_OTHER): Payer: Medicare Other

## 2018-12-29 DIAGNOSIS — R519 Headache, unspecified: Secondary | ICD-10-CM | POA: Insufficient documentation

## 2018-12-29 DIAGNOSIS — G501 Atypical facial pain: Secondary | ICD-10-CM | POA: Insufficient documentation

## 2019-02-06 DIAGNOSIS — W19XXXA Unspecified fall, initial encounter: Secondary | ICD-10-CM | POA: Insufficient documentation

## 2020-01-04 DIAGNOSIS — G500 Trigeminal neuralgia: Secondary | ICD-10-CM | POA: Insufficient documentation

## 2020-04-22 ENCOUNTER — Other Ambulatory Visit: Payer: Self-pay | Admitting: Cardiology

## 2020-04-22 MED ORDER — SODIUM CHLORIDE 0.9% FLUSH: 3.0000 mL | Freq: Two times a day (BID) | INTRAVENOUS | Status: AC

## 2020-05-02 ENCOUNTER — Other Ambulatory Visit: Payer: Self-pay | Admitting: Neurology

## 2020-05-02 ENCOUNTER — Other Ambulatory Visit (HOSPITAL_COMMUNITY): Payer: Self-pay | Admitting: Neurology

## 2020-05-02 DIAGNOSIS — R519 Headache, unspecified: Secondary | ICD-10-CM

## 2020-05-02 DIAGNOSIS — G500 Trigeminal neuralgia: Secondary | ICD-10-CM

## 2020-05-03 ENCOUNTER — Ambulatory Visit: Admit: 2020-05-03 | Payer: Medicare Other | Admitting: Cardiology

## 2020-05-03 DIAGNOSIS — I495 Sick sinus syndrome: Secondary | ICD-10-CM

## 2020-05-03 SURGERY — PACEMAKER LEADLESS INSERTION
Anesthesia: Moderate Sedation

## 2020-05-15 ENCOUNTER — Ambulatory Visit: Payer: Medicare Other

## 2020-05-17 ENCOUNTER — Ambulatory Visit
Admission: RE | Admit: 2020-05-17 | Discharge: 2020-05-17 | Disposition: A | Discharge status: Home / Self Care | Payer: Medicare Other | Source: Ambulatory Visit | Attending: Neurology | Admitting: Neurology

## 2020-05-17 ENCOUNTER — Other Ambulatory Visit: Payer: Self-pay

## 2020-05-17 DIAGNOSIS — G500 Trigeminal neuralgia: Secondary | ICD-10-CM | POA: Insufficient documentation

## 2020-05-17 DIAGNOSIS — R519 Headache, unspecified: Secondary | ICD-10-CM | POA: Insufficient documentation

## 2020-06-12 ENCOUNTER — Other Ambulatory Visit
Admission: RE | Admit: 2020-06-12 | Discharge: 2020-06-12 | Disposition: A | Discharge status: Home / Self Care | Payer: Medicare Other | Source: Ambulatory Visit | Attending: Internal Medicine | Admitting: Internal Medicine

## 2020-06-12 ENCOUNTER — Other Ambulatory Visit: Payer: Self-pay

## 2020-06-12 DIAGNOSIS — Z20822 Contact with and (suspected) exposure to covid-19: Secondary | ICD-10-CM | POA: Diagnosis not present

## 2020-06-12 DIAGNOSIS — Z01812 Encounter for preprocedural laboratory examination: Secondary | ICD-10-CM | POA: Diagnosis present

## 2020-06-12 LAB — SARS CORONAVIRUS 2 (TAT 6-24 HRS): SARS Coronavirus 2: NEGATIVE

## 2020-06-14 ENCOUNTER — Encounter: Payer: Self-pay | Admitting: Cardiology

## 2020-06-14 ENCOUNTER — Encounter
Admission: RE | Disposition: A | Discharge status: Home / Self Care | Payer: Self-pay | Source: Home / Self Care | Attending: Cardiology

## 2020-06-14 ENCOUNTER — Other Ambulatory Visit: Payer: Self-pay

## 2020-06-14 ENCOUNTER — Ambulatory Visit
Admission: RE | Admit: 2020-06-14 | Discharge: 2020-06-14 | Disposition: A | Discharge status: Home / Self Care | Payer: Medicare Other | Attending: Cardiology | Admitting: Cardiology

## 2020-06-14 DIAGNOSIS — Z45010 Encounter for checking and testing of cardiac pacemaker pulse generator [battery]: Secondary | ICD-10-CM | POA: Diagnosis present

## 2020-06-14 DIAGNOSIS — I495 Sick sinus syndrome: Secondary | ICD-10-CM | POA: Diagnosis not present

## 2020-06-14 HISTORY — PX: PPM GENERATOR CHANGEOUT: EP1233

## 2020-06-14 SURGERY — PPM GENERATOR CHANGEOUT

## 2020-06-14 MED ORDER — FENTANYL CITRATE (PF) 100 MCG/2ML IJ SOLN
INTRAMUSCULAR | Status: AC
  Filled 2020-06-14: qty 2

## 2020-06-14 MED ORDER — SODIUM CHLORIDE 0.9 % IV SOLN: 250.0000 mL | INTRAVENOUS | Status: DC

## 2020-06-14 MED ORDER — CHLORHEXIDINE GLUCONATE 4 % EX LIQD
4.0000 "application " | Freq: Once | CUTANEOUS | Status: AC
  Administered 2020-06-14: 4 via TOPICAL

## 2020-06-14 MED ORDER — FENTANYL CITRATE (PF) 100 MCG/2ML IJ SOLN
INTRAMUSCULAR | Status: DC | PRN
  Administered 2020-06-14: 25 ug via INTRAVENOUS

## 2020-06-14 MED ORDER — LIDOCAINE HCL (PF) 1 % IJ SOLN
INTRAMUSCULAR | Status: AC
  Filled 2020-06-14: qty 30

## 2020-06-14 MED ORDER — MIDAZOLAM HCL 2 MG/2ML IJ SOLN
INTRAMUSCULAR | Status: AC
  Filled 2020-06-14: qty 2

## 2020-06-14 MED ORDER — MIDAZOLAM HCL 2 MG/2ML IJ SOLN
INTRAMUSCULAR | Status: DC | PRN
  Administered 2020-06-14 (×2): 0.5 mg via INTRAVENOUS

## 2020-06-14 MED ORDER — VANCOMYCIN HCL IN DEXTROSE 1-5 GM/200ML-% IV SOLN
1000.0000 mg | INTRAVENOUS | Status: AC
  Administered 2020-06-14: 1000 mg via INTRAVENOUS
  Filled 2020-06-14: qty 200

## 2020-06-14 MED ORDER — SODIUM CHLORIDE 0.9% FLUSH: 3.0000 mL | INTRAVENOUS | Status: DC | PRN

## 2020-06-14 MED ORDER — LIDOCAINE HCL (PF) 1 % IJ SOLN
INTRAMUSCULAR | Status: DC | PRN
  Administered 2020-06-14: 10 mL

## 2020-06-14 MED ORDER — SODIUM CHLORIDE 0.9 % IV SOLN
80.0000 mg | INTRAVENOUS | Status: AC
  Administered 2020-06-14: 80 mg
  Filled 2020-06-14: qty 80

## 2020-06-14 SURGICAL SUPPLY — 13 items
CABLE SURG 12 DISP A/V CHANNEL (MISCELLANEOUS) ×1 IMPLANT
COVER SURGICAL LIGHT HANDLE (MISCELLANEOUS) ×2 IMPLANT
DEVICE DSSCT PLSMBLD 3.0S LGHT (MISCELLANEOUS) IMPLANT
IPG PACE AZUR XT DR MRI W1DR01 (Pacemaker) IMPLANT
PACE AZURE XT DR MRI W1DR01 (Pacemaker) ×2 IMPLANT
PAD ELECT DEFIB RADIOL ZOLL (MISCELLANEOUS) ×1 IMPLANT
PLASMABLADE 3.0S W/LIGHT (MISCELLANEOUS) ×2
SUT DVC V-LOC 4-0 90 CLR P-12 (SUTURE) ×4
SUT DVC VLOC 3-0 CL 6 P-12 (SUTURE) ×2 IMPLANT
SUT VIC AB 2-0 CT1 27 (SUTURE) ×4
SUT VIC AB 2-0 CT1 TAPERPNT 27 (SUTURE) IMPLANT
SUTURE DVC V-LC4-0 90 CLR P-12 (SUTURE) ×2 IMPLANT
TRAY PACEMAKER INSERTION (PACKS) ×2 IMPLANT

## 2020-06-14 NOTE — H&P (Addendum)
87/y/o w h/o SSS, Patient has reached elective replacement indicator doe her pacemaker.   She is here today for pacemaker generator replacement  She has has no recent hospitalizations or ER visits.  I have reviewed her past medical history and relevant social history.  Physical exam her lungs are clear to auscultation cardiovascular is an regular rate and rhythm 2 out of 6 systolic ejection murmur.  Device is located in the left infraclavicular fossa there is no concerns around a pocket infection no erythema no evidence of impending erosion. Mallampati class 2 Plan: Dual-chamber pacemaker generator replacement under conscious sedation.  Anticipate discharge later today.  She will follow up with her primary cardiologist in 2 weeks for wound check.

## 2020-06-14 NOTE — Discharge Instructions (Signed)
Resume Warfarin tonight. Continues all prior medications.

## 2021-01-27 DIAGNOSIS — R0602 Shortness of breath: Secondary | ICD-10-CM | POA: Insufficient documentation

## 2021-06-26 DIAGNOSIS — Z923 Personal history of irradiation: Secondary | ICD-10-CM | POA: Insufficient documentation

## 2021-10-23 DIAGNOSIS — R7303 Prediabetes: Secondary | ICD-10-CM | POA: Insufficient documentation

## 2021-11-23 DIAGNOSIS — M899 Disorder of bone, unspecified: Secondary | ICD-10-CM | POA: Insufficient documentation

## 2021-11-23 DIAGNOSIS — G894 Chronic pain syndrome: Secondary | ICD-10-CM | POA: Insufficient documentation

## 2021-11-23 DIAGNOSIS — Z789 Other specified health status: Secondary | ICD-10-CM | POA: Insufficient documentation

## 2021-11-23 DIAGNOSIS — S72019A Unspecified intracapsular fracture of unspecified femur, initial encounter for closed fracture: Secondary | ICD-10-CM | POA: Insufficient documentation

## 2021-11-23 DIAGNOSIS — Z79899 Other long term (current) drug therapy: Secondary | ICD-10-CM | POA: Insufficient documentation

## 2021-11-23 DIAGNOSIS — S93409A Sprain of unspecified ligament of unspecified ankle, initial encounter: Secondary | ICD-10-CM | POA: Insufficient documentation

## 2021-11-23 DIAGNOSIS — B370 Candidal stomatitis: Secondary | ICD-10-CM | POA: Insufficient documentation

## 2021-11-23 NOTE — Progress Notes (Unsigned)
Patient: Tina Barron  Service Category: E/M  Provider: Gaspar Cola, MD  DOB: March 20, 1932  DOS: 11/24/2021  Referring Provider: Creed Copper  MRN: [id number]  Setting: Ambulatory outpatient  PCP: Marinda Elk, MD  Type: New Patient  Specialty: Interventional Pain Management    Location: Office  Delivery: Face-to-face     Primary Reason(s) for Visit: Encounter for initial evaluation of one or more chronic problems (new to examiner) potentially causing chronic pain, and posing a threat to normal musculoskeletal function. (Level of risk: High) CC: No chief complaint on file.  HPI  Tina Barron is a 88 y.o. year old, female patient, who comes for the first time to our practice referred by Cathe Mons, PA-C for our initial evaluation of her chronic pain. She has Anemia; Atrial fibrillation ([place]); Essential hypertension, benign; Leg pain; Aortic atherosclerosis ([place]); Iron deficiency anemia; Paroxysmal A-fib ([facility]); GI bleed; Right upper quadrant abdominal pain; Abdominal pain; AVM (arteriovenous malformation) of small bowel, acquired with hemorrhage; B12 deficiency; Candida infection of mouth; Closed fracture of intracapsular section of femur ([place]); Degenerative joint disease (DJD) of hip; Diarrhea in adult patient; Facial pain; Fall with injury; Hyperlipidemia, mixed; IBS (irritable bowel syndrome); Lumbar radiculopathy; Prediabetes; SOBOE (shortness of breath on exertion); Sprain of ankle; Status post gamma knife treatment; Trigeminal neuralgia of right side of face; Valvular heart disease; Sinoatrial node dysfunction ([place]); Benign essential hypertension; Pain of left lower extremity; Chronic pain syndrome; Pharmacologic therapy; Disorder of skeletal system; and Problems influencing health status on their problem list. Today she comes in for evaluation of her No chief complaint on file.  Pain Assessment: Location:     Radiating:   Onset:   Duration:   Quality:   Severity:  /10  (subjective, self-reported pain score)  Effect on ADL:   Timing:   Modifying factors:   BP:    HR:    Onset and Duration: {Hx; Onset and Duration:210120511} Cause of pain: {Hx; Cause:210120521} Severity: {Pain Severity:210120502} Timing: {Symptoms; Timing:210120501} Aggravating Factors: {Causes; Aggravating pain factors:210120507} Alleviating Factors: {Causes; Alleviating Factors:210120500} Associated Problems: {Hx; Associated problems:210120515} Quality of Pain: {Hx; Symptom quality or Descriptor:210120531} Previous Examinations or Tests: {Hx; Previous examinations or test:210120529} Previous Treatments: {Hx; Previous Treatment:210120503}  ***  Today I took the time to provide the patient with information regarding my pain practice. The patient was informed that my practice is divided into two sections: an interventional pain management section, as well as a completely separate and distinct medication management section. I explained that I have procedure days for my interventional therapies, and evaluation days for follow-ups and medication management. Because of the amount of documentation required during both, they are kept separated. This means that there is the possibility that she may be scheduled for a procedure on one day, and medication management the next. I have also informed her that because of staffing and facility limitations, I no longer take patients for medication management only. To illustrate the reasons for this, I gave the patient the example of surgeons, and how inappropriate it would be to refer a patient to his/her care, just to write for the post-surgical antibiotics on a surgery done by a different surgeon.   Because interventional pain management is my board-certified specialty, the patient was informed that joining my practice means that they are open to any and all interventional therapies. I made it clear that this does not mean that they will be forced to have any  procedures done. What this means  is that I believe interventional therapies to be essential part of the diagnosis and proper management of chronic pain conditions. Therefore, patients not interested in these interventional alternatives will be better served under the care of a different practitioner.  The patient was also made aware of my Comprehensive Pain Management Safety Guidelines where by joining my practice, they limit all of their nerve blocks and joint injections to those done by our practice, for as long as we are retained to manage their care.   Historic Controlled Substance Pharmacotherapy Review  PMP and historical list of controlled substances: ***  Current opioid analgesics:   *** MME/day: *** mg/day  Historical Monitoring: The patient  has no history on file for drug use. List of prior UDS Testing: No results found for: "MDMA", "COCAINSCRNUR", "PCPSCRNUR", "PCPQUANT", "CANNABQUANT", "THCU", "ETH", "CBDTHCR", "D8THCCBX", "D9THCCBX" Historical Background Evaluation: [place] PMP: PDMP reviewed during this encounter. Review of the past 25-month conducted.             PMP NARX Score Report:  Narcotic: 040 Sedative: 020 Stimulant: 000 [place] Department of public safety, offender search: (Editor, commissioningInformation) Non-contributory Risk Assessment Profile: Aberrant behavior: None observed or detected today Risk factors for fatal opioid overdose: None identified today PMP NARX Overdose Risk Score: 190 Fatal overdose hazard ratio (HR): Calculation deferred Non-fatal overdose hazard ratio (HR): Calculation deferred Risk of opioid abuse or dependence: 0.7-3.0% with doses ? 36 MME/day and 6.1-26% with doses ? 120 MME/day. Substance use disorder (SUD) risk level: See below Personal History of Substance Abuse (SUD-Substance use disorder):  Alcohol:    Illegal Drugs:    Rx Drugs:    ORT Risk Level calculation:    ORT Scoring interpretation table:  Score <3 = Low Risk for SUD  Score between 4-7 =  Moderate Risk for SUD  Score >8 = High Risk for Opioid Abuse   PHQ-2 Depression Scale:  Total score:    PHQ-2 Scoring interpretation table: (Score and probability of major depressive disorder)  Score 0 = No depression  Score 1 = 15.4% Probability  Score 2 = 21.1% Probability  Score 3 = 38.4% Probability  Score 4 = 45.5% Probability  Score 5 = 56.4% Probability  Score 6 = 78.6% Probability   PHQ-9 Depression Scale:  Total score:    PHQ-9 Scoring interpretation table:  Score 0-4 = No depression  Score 5-9 = Mild depression  Score 10-14 = Moderate depression  Score 15-19 = Moderately severe depression  Score 20-27 = Severe depression (2.4 times higher risk of SUD and 2.89 times higher risk of overuse)   Pharmacologic Plan: As per protocol, I have not taken over any controlled substance management, pending the results of ordered tests and/or consults.            Initial impression: Pending review of available data and ordered tests.  Meds   Current Outpatient Medications:    acetaminophen (TYLENOL) 500 MG tablet, Take 500 mg by mouth every 6 (six) hours as needed (pain)., Disp: , Rfl:    Calcium Carb-Cholecalciferol (CALCIUM + D3 PO), Take 1 tablet by mouth 3 (three) times a week., Disp: , Rfl:    losartan (COZAAR) 50 MG tablet, Take 50 mg by mouth in the morning., Disp: , Rfl:    metoprolol succinate (TOPROL-XL) 25 MG 24 hr tablet, Take 25 mg by mouth daily., Disp: , Rfl:    MULTAQ 400 MG tablet, Take 400 mg by mouth 2 (two) times daily with a meal. , Disp: ,  Rfl:    vitamin C (ASCORBIC ACID) 250 MG tablet, Take 250 mg by mouth 3 (three) times a week., Disp: , Rfl:    warfarin (COUMADIN) 2.5 MG tablet, Take 1.25-2.5 mg by mouth See admin instructions. Take 0.5 tablet (1.25 mg) by mouth on Tuesdays & Thursdays in the evenings Take 1 tablet (2.5 mg) by mouth on Sundays, Mondays, Wednesdays, Fridays & Saturdays in the evening, Disp: , Rfl:  No current facility-administered medications  for this visit.  Facility-Administered Medications Ordered in Other Visits:    sodium chloride flush (NS) 0.9 % injection 3 mL, 3 mL, Intravenous, Q12H, Fath, Javier Docker, MD  Imaging Review  Cervical Imaging: Cervical MR wo contrast: No results found for this or any previous visit.  Cervical MR wo contrast: No valid procedures specified. Cervical MR w/wo contrast: No results found for this or any previous visit.  Cervical MR w contrast: No results found for this or any previous visit.  Cervical CT wo contrast: No results found for this or any previous visit.  Cervical CT w/wo contrast: No results found for this or any previous visit.  Cervical CT w/wo contrast: No results found for this or any previous visit.  Cervical CT w contrast: No results found for this or any previous visit.  Cervical CT outside: No results found for this or any previous visit.  Cervical DG 1 view: No results found for this or any previous visit.  Cervical DG 2-3 views: No results found for this or any previous visit.  Cervical DG F/E views: No results found for this or any previous visit.  Cervical DG 2-3 clearing views: No results found for this or any previous visit.  Cervical DG Bending/F/E views: No results found for this or any previous visit.  Cervical DG complete: No results found for this or any previous visit.  Cervical DG Myelogram views: No results found for this or any previous visit.  Cervical DG Myelogram views: No results found for this or any previous visit.  Cervical Discogram views: No results found for this or any previous visit.   Shoulder Imaging: Shoulder-R MR w contrast: No results found for this or any previous visit.  Shoulder-L MR w contrast: No results found for this or any previous visit.  Shoulder-R MR w/wo contrast: No results found for this or any previous visit.  Shoulder-L MR w/wo contrast: No results found for this or any previous visit.  Shoulder-R MR wo  contrast: No results found for this or any previous visit.  Shoulder-L MR wo contrast: No results found for this or any previous visit.  Shoulder-R CT w contrast: No results found for this or any previous visit.  Shoulder-L CT w contrast: No results found for this or any previous visit.  Shoulder-R CT w/wo contrast: No results found for this or any previous visit.  Shoulder-L CT w/wo contrast: No results found for this or any previous visit.  Shoulder-R CT wo contrast: No results found for this or any previous visit.  Shoulder-L CT wo contrast: No results found for this or any previous visit.  Shoulder-R DG Arthrogram: No results found for this or any previous visit.  Shoulder-L DG Arthrogram: No results found for this or any previous visit.  Shoulder-R DG 1 view: No results found for this or any previous visit.  Shoulder-L DG 1 view: No results found for this or any previous visit.  Shoulder-R DG: No results found for this or any previous visit.  Shoulder-L DG:  No results found for this or any previous visit.   Thoracic Imaging: Thoracic MR wo contrast: No results found for this or any previous visit.  Thoracic MR wo contrast: No valid procedures specified. Thoracic MR w/wo contrast: No results found for this or any previous visit.  Thoracic MR w contrast: No results found for this or any previous visit.  Thoracic CT wo contrast: No results found for this or any previous visit.  Thoracic CT w/wo contrast: No results found for this or any previous visit.  Thoracic CT w/wo contrast: No results found for this or any previous visit.  Thoracic CT w contrast: No results found for this or any previous visit.  Thoracic DG 2-3 views: No results found for this or any previous visit.  Thoracic DG 4 views: No results found for this or any previous visit.  Thoracic DG: No results found for this or any previous visit.  Thoracic DG w/swimmers view: No results found for this or  any previous visit.  Thoracic DG Myelogram views: No results found for this or any previous visit.  Thoracic DG Myelogram views: No results found for this or any previous visit.   Lumbosacral Imaging: Lumbar MR wo contrast: No results found for this or any previous visit.  Lumbar MR wo contrast: No valid procedures specified. Lumbar MR w/wo contrast: No results found for this or any previous visit.  Lumbar MR w/wo contrast: No results found for this or any previous visit.  Lumbar MR w contrast: No results found for this or any previous visit.  Lumbar CT wo contrast: Results for orders placed during the hospital encounter of 08/14/16  CT LUMBAR SPINE WO CONTRAST  Narrative CLINICAL DATA:  Mid low back pain radiating down both legs.  EXAM: CT LUMBAR SPINE WITHOUT CONTRAST  TECHNIQUE: Multidetector CT imaging of the lumbar spine was performed without intravenous contrast administration. Multiplanar CT image reconstructions were also generated.  COMPARISON:  CT abdomen 07/17/2013  FINDINGS: Segmentation: 5 lumbar type vertebrae.  Alignment: Normal.  Vertebrae: No acute fracture or focal pathologic process. Osteoarthritis of bilateral sacroiliac joints.  Paraspinal and other soft tissues: No paraspinal abnormality. Abdominal aortic atherosclerosis. Trace right pleural effusion. Small left pleural effusion. Hypodense, fluid attenuating 2 cm right hepatic mass most consistent with a cyst.  Disc levels:  Degenerative disc disease with disc height loss at L2-3, L3-4, L4-5 and L5-S1.  At L1-2 there is mild bilateral facet arthropathy. At L2-3 there is a broad-based disc osteophyte complex. Mild bilateral facet arthropathy. No foraminal stenosis.  At L3-4 there is a mild broad-based disc osteophyte complex. There is mild bilateral facet arthropathy. No foraminal stenosis.  At L4-5 there is a broad-based disc osteophyte complex. Moderate bilateral facet arthropathy. No  foraminal stenosis.  At L5-S1 there is a broad-based disc osteophyte complex. Mild bilateral facet arthropathy. No significant foraminal stenosis.  IMPRESSION: 1.  No acute osseous injury of the lumbar spine. 2. Diffuse lumbar spine spondylosis as described above.   Electronically Signed By: Kathreen Devoid On: 08/14/2016 15:36  Lumbar CT w/wo contrast: No results found for this or any previous visit.  Lumbar CT w/wo contrast: No results found for this or any previous visit.  Lumbar CT w contrast: No results found for this or any previous visit.  Lumbar DG 1V: No results found for this or any previous visit.  Lumbar DG 1V (Clearing): No results found for this or any previous visit.  Lumbar DG 2-3V (Clearing): No results found  for this or any previous visit.  Lumbar DG 2-3 views: No results found for this or any previous visit.  Lumbar DG (Complete) 4+V: No results found for this or any previous visit.        Lumbar DG F/E views: No results found for this or any previous visit.        Lumbar DG Bending views: No results found for this or any previous visit.        Lumbar DG Myelogram views: No results found for this or any previous visit.  Lumbar DG Myelogram: No results found for this or any previous visit.  Lumbar DG Myelogram: No results found for this or any previous visit.  Lumbar DG Myelogram: No results found for this or any previous visit.  Lumbar DG Myelogram Lumbosacral: No results found for this or any previous visit.  Lumbar DG Diskogram views: No results found for this or any previous visit.  Lumbar DG Diskogram views: No results found for this or any previous visit.  Lumbar DG Epidurogram OP: No results found for this or any previous visit.  Lumbar DG Epidurogram IP: No valid procedures specified.  Sacroiliac Joint Imaging: Sacroiliac Joint DG: No results found for this or any previous visit.  Sacroiliac Joint MR w/wo contrast: No results found for  this or any previous visit.  Sacroiliac Joint MR wo contrast: No results found for this or any previous visit.   Spine Imaging: Whole Spine DG Myelogram views: No results found for this or any previous visit.  Whole Spine MR Mets screen: No results found for this or any previous visit.  Whole Spine MR Mets screen: No results found for this or any previous visit.  Whole Spine MR w/wo: No results found for this or any previous visit.  MRA Spinal Canal w/ cm: No results found for this or any previous visit.  MRA Spinal Canal wo/ cm: No valid procedures specified. MRA Spinal Canal w/wo cm: No results found for this or any previous visit.  Spine Outside MR Films: No results found for this or any previous visit.  Spine Outside CT Films: No results found for this or any previous visit.  CT-Guided Biopsy: No results found for this or any previous visit.  CT-Guided Needle Placement: No results found for this or any previous visit.  DG Spine outside: No results found for this or any previous visit.  IR Spine outside: No results found for this or any previous visit.  NM Spine outside: No results found for this or any previous visit.   Hip Imaging: Hip-R MR w contrast: No results found for this or any previous visit.  Hip-L MR w contrast: No results found for this or any previous visit.  Hip-R MR w/wo contrast: No results found for this or any previous visit.  Hip-L MR w/wo contrast: No results found for this or any previous visit.  Hip-R MR wo contrast: No results found for this or any previous visit.  Hip-L MR wo contrast: No results found for this or any previous visit.  Hip-R CT w contrast: No results found for this or any previous visit.  Hip-L CT w contrast: No results found for this or any previous visit.  Hip-R CT w/wo contrast: No results found for this or any previous visit.  Hip-L CT w/wo contrast: No results found for this or any previous visit.  Hip-R CT wo  contrast: No results found for this or any previous visit.  Hip-L CT wo  contrast: Results for orders placed during the hospital encounter of 12/01/16  CT HIP LEFT WO CONTRAST  Narrative CLINICAL DATA:  Left hip pain radiating into the lateral aspect of the left leg to the mid thigh for 2 years. History of fixation of a left hip fracture 02/07/2014. No recent injury.  EXAM: CT OF THE LEFT HIP WITHOUT CONTRAST  TECHNIQUE: Multidetector CT imaging of the left hip was performed according to the standard protocol. Multiplanar CT image reconstructions were also generated.  COMPARISON:  CT left hip 02/06/2014.  FINDINGS: Bones/Joint/Cartilage  Four screws are in place for fixation of a healed subcapital left hip fracture. There is sclerosis of the superior and medial aspect of the femoral head consistent with avascular necrosis. There is partial collapse of the femoral head in this location of up to 0.4 cm medially. The necrotic fragment of bone is not fragmented. Screws are intact without loosening or other complicating feature. No new fracture is identified. There is very mild degenerative change about the right hip. Moderate osteoarthritis at the right SI joint and symphysis pubis is identified.  Ligaments  Suboptimally assessed by CT.  Muscles and Tendons  Appear normal.  Soft tissues  Sigmoid diverticulosis is noted.  IMPRESSION: Healed subcapital fracture left hip with four fixation screws in place. 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  Minimal osteoarthritis left hip.  Moderate osteoarthritis symphysis pubis and left SI joint.  Sigmoid diverticulosis.   Electronically Signed By: Inge Rise M.D. On: 12/01/2016 14:20  Hip-R DG 2-3 views: No results found for this or any previous visit.  Hip-L DG 2-3 views: No results  found for this or any previous visit.  Hip-R DG Arthrogram: No results found for this or any previous visit.  Hip-L DG Arthrogram: No results found for this or any previous visit.  Hip-B DG Bilateral: No results found for this or any previous visit.   Knee Imaging: Knee-R MR w contrast: No results found for this or any previous visit.  Knee-L MR w/o contrast: No results found for this or any previous visit.  Knee-R MR w/wo contrast: No results found for this or any previous visit.  Knee-L MR w/wo contrast: No results found for this or any previous visit.  Knee-R MR wo contrast: No results found for this or any previous visit.  Knee-L MR wo contrast: No results found for this or any previous visit.  Knee-R CT w contrast: No results found for this or any previous visit.  Knee-L CT w contrast: No results found for this or any previous visit.  Knee-R CT w/wo contrast: No results found for this or any previous visit.  Knee-L CT w/wo contrast: No results found for this or any previous visit.  Knee-R CT wo contrast: No results found for this or any previous visit.  Knee-L CT wo contrast: No results found for this or any previous visit.  Knee-R DG 1-2 views: No results found for this or any previous visit.  Knee-L DG 1-2 views: No results found for this or any previous visit.  Knee-R DG 3 views: No results found for this or any previous visit.  Knee-L DG 3 views: No results found for this or any previous visit.  Knee-R DG 4 views: No results found for this or any previous visit.  Knee-L DG 4 views: No results found for this or any previous  visit.  Knee-R DG Arthrogram: No results found for this or any previous visit.  Knee-L DG Arthrogram: No results found for this or any previous visit.   Ankle Imaging: Ankle-R DG Complete: No results found for this or any previous visit.  Ankle-L DG Complete: No results found for this or any previous visit.   Foot Imaging: Foot-R  DG Complete: No results found for this or any previous visit.  Foot-L DG Complete: No results found for this or any previous visit.   Elbow Imaging: Elbow-R DG Complete: No results found for this or any previous visit.  Elbow-L DG Complete: No results found for this or any previous visit.   Wrist Imaging: Wrist-R DG Complete: No results found for this or any previous visit.  Wrist-L DG Complete: No results found for this or any previous visit.   Hand Imaging: Hand-R DG Complete: No results found for this or any previous visit.  Hand-L DG Complete: No results found for this or any previous visit.   Complexity Note: Imaging results reviewed.                         ROS  Cardiovascular: {Hx; Cardiovascular History:210120525} Pulmonary or Respiratory: {Hx; Pumonary and/or Respiratory History:210120523} Neurological: {Hx; Neurological:210120504} Psychological-Psychiatric: {Hx; Psychological-Psychiatric History:210120512} Gastrointestinal: {Hx; Gastrointestinal:210120527} Genitourinary: {Hx; Genitourinary:210120506} Hematological: {Hx; Hematological:210120510} Endocrine: {Hx; Endocrine history:210120509} Rheumatologic: {Hx; Rheumatological:210120530} Musculoskeletal: {Hx; Musculoskeletal:210120528} Work History: {Hx; Work history:210120514}  Allergies  Ms. Tallman is allergic to amlodipine, diltiazem hcl, duloxetine, other, penicillin g, shellfish allergy, doxycycline, hydrocortisone, and prednisone.  Laboratory Chemistry Profile   Renal Lab Results  Component Value Date   BUN 19 11/25/2018   CREATININE 1.04 (H) 11/25/2018   GFRAA 57 (L) 11/25/2018   GFRNONAA 49 (L) 11/25/2018   PROTEINUR Negative 02/27/2014     Electrolytes Lab Results  Component Value Date   NA 142 11/25/2018   K 4.1 11/25/2018   CL 109 11/25/2018   CALCIUM 9.6 11/25/2018     Hepatic Lab Results  Component Value Date   AST 16 09/13/2018   ALT 9 09/13/2018   ALBUMIN 2.9 (L) 09/13/2018    ALKPHOS 36 (L) 09/13/2018   LIPASE 128 07/17/2013     ID Lab Results  Component Value Date   [place] NEGATIVE 06/12/2020     Bone No results found for: "VD25OH", "VD125OH2TOT", "WL8937DS2", "AJ6811XB2", "25OHVITD1", "25OHVITD2", "62MBTDHR4", "TESTOFREE", "TESTOSTERONE"   Endocrine Lab Results  Component Value Date   GLUCOSE 94 11/25/2018   GLUCOSEU Negative 02/27/2014   TSH 5.13 (H) 02/13/2014     Neuropathy No results found for: "VITAMINB12", "FOLATE", "HGBA1C", "HIV"   CNS No results found for: "COLORCSF", "APPEARCSF", "RBCCOUNTCSF", "WBCCSF", "POLYSCSF", "LYMPHSCSF", "EOSCSF", "PROTEINCSF", "GLUCCSF", "JCVIRUS", "CSFOLI", "IGGCSF", "LABACHR", "ACETBL"   Inflammation (CRP: Acute  ESR: Chronic) Lab Results  Component Value Date   CRP <0.8 11/25/2018   ESRSEDRATE 43 (H) 11/25/2018     Rheumatology No results found for: "RF", "ANA", "LABURIC", "URICUR", "LYMEIGGIGMAB", "LYMEABIGMQN", "HLAB27"   Coagulation Lab Results  Component Value Date   INR 1.5 (H) 09/16/2018   LABPROT 17.9 (H) 09/16/2018   APTT 58 (H) 09/06/2018   PLT 188 11/25/2018     Cardiovascular Lab Results  Component Value Date   CKTOTAL 118 02/06/2014   TROPONINI < 0.02 07/17/2013   HGB 10.9 (L) 11/25/2018   HCT 34.2 (L) 11/25/2018     Screening Lab Results  Component Value Date   [place] NEGATIVE 06/12/2020  Cancer No results found for: "CEA", "CA125", "LABCA2"   Allergens No results found for: "ALMOND", "APPLE", "ASPARAGUS", "AVOCADO", "BANANA", "BARLEY", "BASIL", "BAYLEAF", "GREENBEAN", "LIMABEAN", "WHITEBEAN", "BEEFIGE", "REDBEET", "BLUEBERRY", "BROCCOLI", "CABBAGE", "MELON", "CARROT", "CASEIN", "CASHEWNUT", "CAULIFLOWER", "CELERY"     Note: Lab results reviewed.  [place]  Drug: Ms. Devery  has no history on file for drug use. Alcohol:  has no history on file for alcohol use. Tobacco:  reports that she has never smoked. She has never used smokeless tobacco. Medical:  has a  past medical history of Anxiety and depression, Atherosclerosis, B12 deficiency, Degenerative joint disease (DJD) of hip, Fibromyalgia syndrome, HH (hiatus hernia), Hypertension, IBS (irritable bowel syndrome), Osteopenia, Paroxysmal A-fib ([place]), Sinoatrial node dysfunction ([place]), and Valvular heart disease. Family: family history includes Breast cancer in her daughter; CVA in her mother; Lung cancer in her brother and father.  Past Surgical History:  Procedure Laterality Date   APPENDECTOMY  1952   CARDIAC VALVE REPLACEMENT     COLONOSCOPY N/A 09/10/2018   Procedure: COLONOSCOPY;  Surgeon: Jonathon Bellows, MD;  Location: [facility] ENDOSCOPY;  Service: Gastroenterology;  Laterality: N/A;   ESOPHAGOGASTRODUODENOSCOPY N/A 09/09/2018   Procedure: ESOPHAGOGASTRODUODENOSCOPY (EGD);  Surgeon: Toledo, Benay Pike, MD;  Location: [facility] ENDOSCOPY;  Service: Gastroenterology;  Laterality: N/A;   ESOPHAGOGASTRODUODENOSCOPY N/A 09/15/2018   Procedure: ESOPHAGOGASTRODUODENOSCOPY (EGD);  Surgeon: Toledo, Benay Pike, MD;  Location: [facility] ENDOSCOPY;  Service: Gastroenterology;  Laterality: N/A;   GIVENS CAPSULE STUDY N/A 09/13/2018   Procedure: GIVENS CAPSULE STUDY;  Surgeon: Toledo, Benay Pike, MD;  Location: [facility] ENDOSCOPY;  Service: Gastroenterology;  Laterality: N/A;   [place]   PLACEMENT OF BREAST IMPLANTS  1976   PPM GENERATOR CHANGEOUT N/A 06/14/2020   Procedure: PPM GENERATOR CHANGEOUT;  Surgeon: Marzetta Board, MD;  Location: [place] CV LAB;  Service: Cardiovascular;  Laterality: N/A;   REPLACEMENT TOTAL HIP W/  RESURFACING IMPLANTS  2015   Active Ambulatory Problems    Diagnosis Date Noted   Anemia 01/22/2016   Atrial fibrillation ([place]) 01/22/2016   Essential hypertension, benign 08/21/2016   Leg pain 08/21/2016   Aortic atherosclerosis ([place]) 08/21/2016   Iron deficiency anemia 04/01/2017   Paroxysmal A-fib ([place]) 11/06/2014   GI bleed 09/06/2018   Right upper  quadrant abdominal pain    Abdominal pain 07/27/2013   AVM (arteriovenous malformation) of small bowel, acquired with hemorrhage 10/03/2018   B12 deficiency 11/01/2013   Candida infection of mouth 11/23/2021   Closed fracture of intracapsular section of femur ([place]) 11/23/2021   Degenerative joint disease (DJD) of hip 11/01/2013   Diarrhea in adult patient 08/10/2013   Facial pain 12/29/2018   Fall with injury 02/06/2019   Hyperlipidemia, mixed 03/03/2018   IBS (irritable bowel syndrome) 11/01/2013   Lumbar radiculopathy 07/30/2016   Prediabetes 10/23/2021   SOBOE (shortness of breath on exertion) 01/27/2021   Sprain of ankle 11/23/2021   Status post gamma knife treatment 06/26/2021   Trigeminal neuralgia of right side of face 01/04/2020   Valvular heart disease 05/31/2013   Sinoatrial node dysfunction ([place]) 05/31/2013   Benign essential hypertension 11/01/2014   Pain of left lower extremity 07/30/2016   Chronic pain syndrome 11/23/2021   Pharmacologic therapy 11/23/2021   Disorder of skeletal system 11/23/2021   Problems influencing health status 11/23/2021   Resolved Ambulatory Problems    Diagnosis Date Noted   No Resolved Ambulatory Problems   Past Medical History:  Diagnosis Date   Anxiety and  depression    Atherosclerosis    Fibromyalgia syndrome    HH (hiatus hernia)    Hypertension    Osteopenia    Constitutional Exam  General appearance: Well nourished, well developed, and well hydrated. In no apparent acute distress There were no vitals filed for this visit. BMI Assessment: Estimated body mass index is 19.42 kg/m as calculated from the following:   Height as of 06/14/20: '5\' 7"'  (1.702 m).   Weight as of 06/14/20: 124 lb (56.2 kg).  BMI interpretation table: BMI level Category Range association with higher incidence of chronic pain  <18 kg/m2 Underweight   18.5-24.9 kg/m2 Ideal body weight   25-29.9 kg/m2 Overweight Increased incidence by 20%  30-34.9 kg/m2  Obese (Class I) Increased incidence by 68%  35-39.9 kg/m2 Severe obesity (Class II) Increased incidence by 136%  >40 kg/m2 Extreme obesity (Class III) Increased incidence by 254%   Patient's current BMI Ideal Body weight  There is no height or weight on file to calculate BMI. Patient weight not recorded   BMI Readings from Last 4 Encounters:  06/14/20 19.42 kg/m  11/25/18 20.36 kg/m  09/10/18 20.28 kg/m  11/24/17 20.99 kg/m   Wt Readings from Last 4 Encounters:  06/14/20 124 lb (56.2 kg)  11/25/18 130 lb (59 kg)  09/10/18 129 lb 8 oz (58.7 kg)  11/24/17 134 lb (60.8 kg)    Psych/Mental status: Alert, oriented x 3 (person, place, & time)       Eyes: PERLA Respiratory: No evidence of acute respiratory distress  Assessment  Primary Diagnosis & Pertinent Problem List: The primary encounter diagnosis was Chronic pain syndrome. Diagnoses of Pharmacologic therapy, Disorder of skeletal system, and Problems influencing health status were also pertinent to this visit.  Visit Diagnosis (New problems to examiner): 1. Chronic pain syndrome   2. Pharmacologic therapy   3. Disorder of skeletal system   4. Problems influencing health status    Plan of Care (Initial workup plan)  Note: Ms. Bielicki was reminded that as per protocol, today's visit has been an evaluation only. We have not taken over the patient's controlled substance management.  Problem-specific plan: No problem-specific Assessment & Plan notes found for this encounter.  Lab Orders  No laboratory test(s) ordered today   Imaging Orders  No imaging studies ordered today   Referral Orders  No referral(s) requested today   Procedure Orders    No procedure(s) ordered today   Pharmacotherapy (current): Medications ordered:  No orders of the defined types were placed in this encounter.  Medications administered during this visit: [place] S. Mun had no medications administered during this visit.   Pharmacological  management options:  Opioid Analgesics: The patient was informed that there is no guarantee that she would be a candidate for opioid analgesics. The decision will be made following CDC guidelines. This decision will be based on the results of diagnostic studies, as well as Ms. Rigg's risk profile.   Membrane stabilizer: To be determined at a later time  Muscle relaxant: To be determined at a later time  NSAID: To be determined at a later time  Other analgesic(s): To be determined at a later time   Interventional management options: Ms. Imhof was informed that there is no guarantee that she would be a candidate for interventional therapies. The decision will be based on the results of diagnostic studies, as well as Ms. Porr's risk profile.  Procedure(s) under consideration:  Pending results of ordered studies  Interventional Therapies  Risk  Complexity Considerations:   Estimated body mass index is 19.42 kg/m as calculated from the following:   Height as of 06/14/20: '5\' 7"'  (1.702 m).   Weight as of 06/14/20: 124 lb (56.2 kg). WNL   Planned  Pending:   Pending further evaluation   Under consideration:   ***   Completed:   None at this time   Completed by other providers:   None at this time   Therapeutic  Palliative (PRN) options:   None established      Provider-requested follow-up: No follow-ups on file.  Future Appointments  Date Time Provider [place]  11/24/2021  2:00 PM Milinda Pointer, MD [facility] None    Note by: Gaspar Cola, MD Date: 11/24/2021; Time: 7:39 PM

## 2021-11-24 ENCOUNTER — Ambulatory Visit: Payer: Medicare Other | Attending: Pain Medicine | Admitting: Pain Medicine

## 2021-11-24 ENCOUNTER — Encounter: Payer: Self-pay | Admitting: Pain Medicine

## 2021-11-24 VITALS — BP 146/72 | HR 67 | Temp 97.5°F | Resp 14 | Ht 67.0 in | Wt 97.0 lb

## 2021-11-24 DIAGNOSIS — Z789 Other specified health status: Secondary | ICD-10-CM

## 2021-11-24 DIAGNOSIS — Z923 Personal history of irradiation: Secondary | ICD-10-CM | POA: Insufficient documentation

## 2021-11-24 DIAGNOSIS — G501 Atypical facial pain: Secondary | ICD-10-CM | POA: Diagnosis present

## 2021-11-24 DIAGNOSIS — M899 Disorder of bone, unspecified: Secondary | ICD-10-CM

## 2021-11-24 DIAGNOSIS — Z79899 Other long term (current) drug therapy: Secondary | ICD-10-CM

## 2021-11-24 DIAGNOSIS — G500 Trigeminal neuralgia: Secondary | ICD-10-CM | POA: Insufficient documentation

## 2021-11-24 DIAGNOSIS — G894 Chronic pain syndrome: Secondary | ICD-10-CM | POA: Diagnosis present

## 2021-12-02 ENCOUNTER — Telehealth: Payer: Self-pay | Admitting: Pain Medicine

## 2021-12-02 NOTE — Telephone Encounter (Signed)
PT stated that she couldn't remember if she were supposed to go get lab work done. I looked in orders from last visit and didn't see any orders for lab work to be done. PT could call back if pain didn't improve. However patients is asking about getting some lab work done to see what's going on. Please give patient a call. Thanks

## 2021-12-04 NOTE — Progress Notes (Signed)
PROVIDER NOTE: Information contained herein reflects review and annotations entered in association with encounter. Interpretation of such information and data should be left to medically-trained personnel. Information provided to patient can be located elsewhere in the medical record under "Patient Instructions". Document created using STT-dictation technology, any transcriptional errors that may result from process are unintentional.    Patient: Tina Barron  Service Category: E/M  Provider: Gaspar Cola, MD  DOB: [birth year]  DOS: 12/08/2021  Referring Provider: Marinda Elk, MD  MRN: [id number]  Specialty: Interventional Pain Management  PCP: Tina Elk, MD  Type: Established Patient  Setting: Ambulatory outpatient    Location: Office  Delivery: Barron-to-Barron     HPI  [name], a 88 y.o. year old female, is here today because of her Atypical facial pain [G50.1]. Tina Barron primary complain today is Facial Pain Last encounter: My last encounter with her was on 12/02/2021. Pertinent problems: Tina Barron has Leg pain; Right upper quadrant abdominal pain; Abdominal pain; Closed fracture of intracapsular section of femur ([address]); Degenerative joint disease (DJD) of hip; Atypical facial pain (Right); Fall with injury; Lumbar radiculopathy; Sprain of ankle; S/P gamma knife treatment; Trigeminal neuralgia of Barron (Right); Pain of left lower extremity; Chronic pain syndrome; Trigeminal neuralgia syndrome (V2) (Right); and Post-herpetic trigeminal neuralgia (Right) on their pertinent problem list. Pain Assessment: Severity of Chronic pain is reported as a 10-Worst pain ever/10. Location: Barron Right/ . Onset: More than a month ago. Quality: Burning, Stabbing. Timing: Intermittent. Modifying factor(s): nothing. Vitals:  height is '5\' 1"'  (1.549 m) and weight is 97 lb (44 kg). Her temporal temperature is 97.4 F (36.3 C) (abnormal). Her blood pressure is 145/70 (abnormal) and her  pulse is 60. Her respiration is 16 and oxygen saturation is 99%.   Reason for encounter: patient-requested evaluation.  During the patient's initial evaluation she had indicated that she was not interested in the use of any type of medications for the treatment of her pain.  After evaluation I then offered her the possibility of doing a right-sided diagnostic trigeminal nerve block to see if this could provide her with some benefit.  Initially she said that she was not interested and she left the practice.  Today she returns indicating that "something needs to be done".  I went ahead and explained to the patient the procedure including the risk and possible complications.  I also explained to the patient that we needed to stop her blood thinner for the procedure.  She is still concerned about the use of steroids.  I have given her additional time to think about whether or not she wants to proceed with this one option.  Since she does not want anything prescribed, I do not see the need for orders for further blood work.  Pharmacotherapy Assessment  Analgesic: None MME/day: 0 mg/day   Monitoring: [place] PMP: PDMP reviewed during this encounter.       Pharmacotherapy: No side-effects or adverse reactions reported. Compliance: No problems identified. Effectiveness: Clinically acceptable.  Tina Shorter, RN  12/08/2021 12:26 PM  Sign when Signing Visit Safety precautions to be maintained throughout the outpatient stay will include: orient to surroundings, keep bed in low position, maintain call bell within reach at all times, provide assistance with transfer out of bed and ambulation.    No results found for: "CBDTHCR" No results found for: "D8THCCBX" No results found for: "D9THCCBX"  UDS:  No results found for: "SUMMARY"    ROS  Constitutional: Denies any fever or chills Gastrointestinal: No reported hemesis, hematochezia, vomiting, or acute GI distress Musculoskeletal: Denies any acute onset  joint swelling, redness, loss of ROM, or weakness Neurological: No reported episodes of acute onset apraxia, aphasia, dysarthria, agnosia, amnesia, paralysis, loss of coordination, or loss of consciousness  Medication Review  Calcium Carb-Cholecalciferol, acetaminophen, dronedarone, losartan, metoprolol succinate, vitamin C, and warfarin  History Review  Allergy: Tina Barron is allergic to amlodipine, diltiazem hcl, duloxetine, penicillin g, shellfish allergy, doxycycline, hydrocortisone, and prednisone. Drug: Tina Barron  has no history on file for drug use. Alcohol:  has no history on file for alcohol use. Tobacco:  reports that she has never smoked. She has never used smokeless tobacco. Social: Tina Barron  reports that she has never smoked. She has never used smokeless tobacco. Medical:  has a past medical history of Anxiety and depression, Atherosclerosis, B12 deficiency, Degenerative joint disease (DJD) of hip, Fibromyalgia syndrome, HH (hiatus hernia), Hypertension, IBS (irritable bowel syndrome), Osteopenia, Paroxysmal A-fib ([place]), Sinoatrial node dysfunction ([place]), and Valvular heart disease. Surgical: Tina Barron  has a past surgical history that includes Cardiac valve replacement; Appendectomy (1952); Lumbar laminectomy (1989); Placement of breast implants (1976); Partial hysterectomy (1972); Replacement total hip w/  resurfacing implants (2015); Esophagogastroduodenoscopy (N/A, 09/09/2018); Colonoscopy (N/A, 09/10/2018); Givens capsule study (N/A, 09/13/2018); Esophagogastroduodenoscopy (N/A, 09/15/2018); and PPM GENERATOR CHANGEOUT (N/A, 06/14/2020). Family: family history includes Breast cancer in her daughter; CVA in her mother; Lung cancer in her brother and father.  Laboratory Chemistry Profile   Renal Lab Results  Component Value Date   BUN 19 11/25/2018   CREATININE 1.04 (H) 11/25/2018   GFRAA 57 (L) 11/25/2018   GFRNONAA 49 (L) 11/25/2018    Hepatic Lab Results  Component Value  Date   AST 16 09/13/2018   ALT 9 09/13/2018   ALBUMIN 2.9 (L) 09/13/2018   ALKPHOS 36 (L) 09/13/2018   LIPASE 128 07/17/2013    Electrolytes Lab Results  Component Value Date   NA 142 11/25/2018   K 4.1 11/25/2018   CL 109 11/25/2018   CALCIUM 9.6 11/25/2018    Bone No results found for: "VD25OH", "VD125OH2TOT", "KC1275TZ0", "YF7494WH6", "25OHVITD1", "25OHVITD2", "25OHVITD3", "TESTOFREE", "TESTOSTERONE"  Inflammation (CRP: Acute Phase) (ESR: Chronic Phase) Lab Results  Component Value Date   CRP <0.8 11/25/2018   ESRSEDRATE 43 (H) 11/25/2018         Note: Above Lab results reviewed.  Recent Imaging Review  EP PPM/ICD IMPLANT Successful dual chamber pacemaker generator replacement. Note: Reviewed        Physical Exam  General appearance: Well nourished, well developed, and well hydrated. In no apparent acute distress Mental status: Alert, oriented x 3 (person, place, & time)       Respiratory: No evidence of acute respiratory distress Eyes: PERLA Vitals: BP (!) 145/70   Pulse 60   Temp (!) 97.4 F (36.3 C) (Temporal)   Resp 16   Ht '5\' 1"'  (1.549 m)   Wt 97 lb (44 kg)   SpO2 99%   BMI 18.33 kg/m  BMI: Estimated body mass index is 18.33 kg/m as calculated from the following:   Height as of this encounter: '5\' 1"'  (1.549 m).   Weight as of this encounter: 97 lb (44 kg). Ideal: Female patients must weigh at least 45.5 kg to calculate ideal body weight  Assessment   Diagnosis Status  1. Atypical facial pain (Right)   2. Trigeminal neuralgia of Barron (Right)   3. Trigeminal neuralgia syndrome (  V2) (Right)   4. S/P gamma knife treatment   5. Post-herpetic trigeminal neuralgia (Right)   6. Chronic anticoagulation (Coumadin)    Controlled Controlled Controlled   Updated Problems: Problem  Post-herpetic trigeminal neuralgia (Right)  S/P gamma knife treatment  Chronic anticoagulation (Coumadin)   Coumadin ANTICOAGULATION (Stop: 5 days  Restart: 2 hours) Needs  Lovenox bridge therapy prior to procedures      Plan of Care  Problem-specific:  No problem-specific Assessment & Plan notes found for this encounter.  Tina Barron has a current medication list which includes the following long-term medication(s): calcium carb-cholecalciferol, losartan, metoprolol succinate, multaq, and warfarin.  Pharmacotherapy (Medications Ordered): No orders of the defined types were placed in this encounter.  Orders:  Orders Placed This Encounter  Procedures   TRIGEMINAL NERVE BLOCK    Standing Status:   Future    Standing Expiration Date:   03/09/2022    Scheduling Instructions:     Side/Laterality: Right-sided     Sedation: Patient's choice.     Scheduling timeframe: As soon as convenient for Tina Barron.    Order Specific Question:   Where will this procedure be performed?    Answer:   [facility]   Blood Thinner Instructions to Nursing    If unable to stop, ask if Lovenox-bridge therapy may be possible, and if so, request their assistance in implementing it.    Scheduling Instructions:     Contact the physician prescribing the blood thinner and request clearance to stop it for time period stipulated below.     If approved by prescribing physician, stop Coumadin (Warfarin) X 5 days prior to procedure or surgery.   Blood Thinner Instructions to Nursing    Always make sure patient has clearance from prescribing physician to stop blood thinners for interventional therapies. If the patient requires a Lovenox-bridge therapy, make sure arrangements are made to institute it with the assistance of the PCP.    Scheduling Instructions:     Have Tina Barron stop the Coumadin (Warfarin) X 5 days prior to procedure or surgery.   Follow-up plan:   Return for procedure (Clinic): (R) Trigeminal NB #1, (Blood Thinner Protocol).     Interventional Therapies  Risk  Complexity Considerations:   Estimated body mass index is 15.19 kg/m as calculated from  the following:   Height as of this encounter: '5\' 7"'  (1.702 m).   Weight as of this encounter: 97 lb (44 kg). Coumadin ANTICOAGULATION (Stop: 5 days  Restart: 2 hours)   Planned  Pending:      Under consideration:      Completed:   None at this time   Completed by other providers:   None at this time   Therapeutic  Palliative (PRN) options:   None established       Recent Visits Date Type Provider Dept  11/24/21 Office Visit Milinda Pointer, MD [facility]  Showing recent visits within past 90 days and meeting all other requirements Today's Visits Date Type Provider Dept  12/08/21 Office Visit Milinda Pointer, MD [facility]  Showing today's visits and meeting all other requirements Future Appointments No visits were found meeting these conditions. Showing future appointments within next 90 days and meeting all other requirements  I discussed the assessment and treatment plan with the patient. The patient was provided an opportunity to ask questions and all were answered. The patient agreed with the plan and demonstrated an understanding of the instructions.  Patient  advised to call back or seek an in-person evaluation if the symptoms or condition worsens.  Duration of encounter: 30 minutes.  Total time on encounter, as per AMA guidelines included both the Barron-to-Barron and non-Barron-to-Barron time personally spent by the physician and/or other qualified health care professional(s) on the day of the encounter (includes time in activities that require the physician or other qualified health care professional and does not include time in activities normally performed by clinical staff). Physician's time may include the following activities when performed: preparing to see the patient (eg, review of tests, pre-charting review of records) obtaining and/or reviewing separately obtained history performing a medically appropriate examination and/or  evaluation counseling and educating the patient/family/caregiver ordering medications, tests, or procedures referring and communicating with other health care professionals (when not separately reported) documenting clinical information in the electronic or other health record independently interpreting results (not separately reported) and communicating results to the patient/ family/caregiver care coordination (not separately reported)  Note by: Tina Cola, MD Date: 12/08/2021; Time: 3:39 PM

## 2021-12-08 ENCOUNTER — Encounter: Payer: Self-pay | Admitting: Pain Medicine

## 2021-12-08 ENCOUNTER — Telehealth: Payer: Self-pay

## 2021-12-08 ENCOUNTER — Ambulatory Visit: Payer: Medicare Other | Attending: Pain Medicine | Admitting: Pain Medicine

## 2021-12-08 VITALS — BP 145/70 | HR 60 | Temp 97.4°F | Resp 16 | Ht 61.0 in | Wt 97.0 lb

## 2021-12-08 DIAGNOSIS — G501 Atypical facial pain: Secondary | ICD-10-CM | POA: Diagnosis present

## 2021-12-08 DIAGNOSIS — Z7901 Long term (current) use of anticoagulants: Secondary | ICD-10-CM | POA: Diagnosis present

## 2021-12-08 DIAGNOSIS — B0222 Postherpetic trigeminal neuralgia: Secondary | ICD-10-CM | POA: Diagnosis present

## 2021-12-08 DIAGNOSIS — G500 Trigeminal neuralgia: Secondary | ICD-10-CM

## 2021-12-08 DIAGNOSIS — Z923 Personal history of irradiation: Secondary | ICD-10-CM | POA: Diagnosis present

## 2021-12-08 NOTE — Patient Instructions (Signed)
____________________________________________________________________________________________  Blood Thinners  IMPORTANT NOTICE:  If you take any of these, make sure to notify the nursing staff.  Failure to do so may result in injury.  Recommended time intervals to stop and restart blood-thinners, before & after invasive procedures  Generic Name Brand Name Pre-procedure. Stop this long before procedure. Post-procedure. Minimum waiting period before restarting.  Abciximab Reopro 15 days 2 hrs  Alteplase Activase 10 days 10 days  Anagrelide Agrylin    Apixaban Eliquis 3 days 6 hrs  Cilostazol Pletal 3 days 5 hrs  Clopidogrel Plavix 7-10 days 2 hrs  Dabigatran Pradaxa 5 days 6 hrs  Dalteparin Fragmin 24 hours 4 hrs  Dipyridamole Aggrenox 11days 2 hrs  Edoxaban Lixiana; Savaysa 3 days 2 hrs  Enoxaparin  Lovenox 24 hours 4 hrs  Eptifibatide Integrillin 8 hours 2 hrs  Fondaparinux  Arixtra 72 hours 12 hrs  Hydroxychloroquine Plaquenil 11 days   Prasugrel Effient 7-10 days 6 hrs  Reteplase Retavase 10 days 10 days  Rivaroxaban Xarelto 3 days 6 hrs  Ticagrelor Brilinta 5-7 days 6 hrs  Ticlopidine Ticlid 10-14 days 2 hrs  Tinzaparin Innohep 24 hours 4 hrs  Tirofiban Aggrastat 8 hours 2 hrs  Warfarin Coumadin 5 days 2 hrs   Other medications with blood-thinning effects  Product indications Generic (Brand) names Note  Cholesterol Lipitor Stop 4 days before procedure  Blood thinner (injectable) Heparin (LMW or LMWH Heparin) Stop 24 hours before procedure  Cancer Ibrutinib (Imbruvica) Stop 7 days before procedure  Malaria/Rheumatoid Hydroxychloroquine (Plaquenil) Stop 11 days before procedure  Thrombolytics  10 days before or after procedures   Over-the-counter (OTC) Products with blood-thinning effects  Product Common names Stop Time  Aspirin > 325 mg Goody Powders, Excedrin, etc. 11 days  Aspirin ? 81 mg  7 days  Fish oil  4 days  Garlic supplements  7 days  Ginkgo biloba  36  hours  Ginseng  24 hours  NSAIDs Ibuprofen, Naprosyn, etc. 3 days  Vitamin E  4 days   ____________________________________________________________________________________________  ______________________________________________________________________  Preparing for Procedure with Sedation  NOTICE: Due to recent regulatory changes, starting on October 14, 2020, procedures requiring intravenous (IV) sedation will no longer be performed at the [facility].  These types of procedures are required to be performed at [facility] ambulatory surgery facility.  We are very sorry for the inconvenience.  Procedure appointments are limited to planned procedures: No Prescription Refills. No disability issues will be discussed. No medication changes will be discussed.  Instructions: Oral Intake: Do not eat or drink anything for at least 8 hours prior to your procedure. (Exception: Blood Pressure Medication. See below.) Transportation: A driver is required. You may not drive yourself after the procedure. Blood Pressure Medicine: Do not forget to take your blood pressure medicine with a sip of water the morning of the procedure. If your Diastolic (lower reading) is above 100 mmHg, elective cases will be cancelled/rescheduled. Blood thinners: These will need to be stopped for procedures. Notify our staff if you are taking any blood thinners. Depending on which one you take, there will be specific instructions on how and when to stop it. Diabetics on insulin: Notify the staff so that you can be scheduled 1st case in the morning. If your diabetes requires high dose insulin, take only  of your normal insulin dose the morning of the procedure and notify the staff that you have done so. Preventing infections: Shower with an antibacterial soap the morning of your   procedure. Build-up your immune system: Take 1000 mg of Vitamin C with every meal (3 times a day) the day prior to your procedure. Antibiotics: Inform  the staff if you have a condition or reason that requires you to take antibiotics before dental procedures. Pregnancy: If you are pregnant, call and cancel the procedure. Sickness: If you have a cold, fever, or any active infections, call and cancel the procedure. Arrival: You must be in the facility at least 30 minutes prior to your scheduled procedure. Children: Do not bring children with you. Dress appropriately: There is always the possibility that your clothing may get soiled. Valuables: Do not bring any jewelry or valuables.  Reasons to call and reschedule or cancel your procedure: (Following these recommendations will minimize the risk of a serious complication.) Surgeries: Avoid having procedures within 2 weeks of any surgery. (Avoid for 2 weeks before or after any surgery). Flu Shots: Avoid having procedures within 2 weeks of a flu shots. (Avoid for 2 weeks before or after immunizations). Barium: Avoid having a procedure within 7-10 days after having had a radiological study involving the use of radiological contrast. (Myelograms, Barium swallow or enema study). Heart attacks: Avoid any elective procedures or surgeries for the initial 6 months after a "Myocardial Infarction" (Heart Attack). Blood thinners: It is imperative that you stop these medications before procedures. Let us know if you if you take any blood thinner.  Infection: Avoid procedures during or within two weeks of an infection (including chest colds or gastrointestinal problems). Symptoms associated with infections include: Localized redness, fever, chills, night sweats or profuse sweating, burning sensation when voiding, cough, congestion, stuffiness, runny nose, sore throat, diarrhea, nausea, vomiting, cold or Flu symptoms, recent or current infections. It is specially important if the infection is over the area that we intend to treat. Heart and lung problems: Symptoms that may suggest an active cardiopulmonary problem  include: cough, chest pain, breathing difficulties or shortness of breath, dizziness, ankle swelling, uncontrolled high or unusually low blood pressure, and/or palpitations. If you are experiencing any of these symptoms, cancel your procedure and contact your primary care physician for an evaluation.  Remember:  Regular Business hours are:  Monday to Thursday 8:00 AM to 4:00 PM  Provider's Schedule: Owin Vignola, MD:  Procedure days: Tuesday and Thursday 7:30 AM to 4:00 PM  Bilal Lateef, MD:  Procedure days: Monday and Wednesday 7:30 AM to 4:00 PM ______________________________________________________________________  ____________________________________________________________________________________________  General Risks and Possible Complications  Patient Responsibilities: It is important that you read this as it is part of your informed consent. It is our duty to inform you of the risks and possible complications associated with treatments offered to you. It is your responsibility as a patient to read this and to ask questions about anything that is not clear or that you believe was not covered in this document.  Patient's Rights: You have the right to refuse treatment. You also have the right to change your mind, even after initially having agreed to have the treatment done. However, under this last option, if you wait until the last second to change your mind, you may be charged for the materials used up to that point.  Introduction: Medicine is not an exact science. Everything in Medicine, including the lack of treatment(s), carries the potential for danger, harm, or loss (which is by definition: Risk). In Medicine, a complication is a secondary problem, condition, or disease that can aggravate an already existing one. All treatments carry the   risk of possible complications. The fact that a side effects or complications occurs, does not imply that the treatment was conducted  incorrectly. It must be clearly understood that these can happen even when everything is done following the highest safety standards.  No treatment: You can choose not to proceed with the proposed treatment alternative. The "PRO(s)" would include: avoiding the risk of complications associated with the therapy. The "CON(s)" would include: not getting any of the treatment benefits. These benefits fall under one of three categories: diagnostic; therapeutic; and/or palliative. Diagnostic benefits include: getting information which can ultimately lead to improvement of the disease or symptom(s). Therapeutic benefits are those associated with the successful treatment of the disease. Finally, palliative benefits are those related to the decrease of the primary symptoms, without necessarily curing the condition (example: decreasing the pain from a flare-up of a chronic condition, such as incurable terminal cancer).  General Risks and Complications: These are associated to most interventional treatments. They can occur alone, or in combination. They fall under one of the following six (6) categories: no benefit or worsening of symptoms; bleeding; infection; nerve damage; allergic reactions; and/or death. No benefits or worsening of symptoms: In Medicine there are no guarantees, only probabilities. No healthcare provider can ever guarantee that a medical treatment will work, they can only state the probability that it may. Furthermore, there is always the possibility that the condition may worsen, either directly, or indirectly, as a consequence of the treatment. Bleeding: This is more common if the patient is taking a blood thinner, either prescription or over the counter (example: Goody Powders, Fish oil, Aspirin, Garlic, etc.), or if suffering a condition associated with impaired coagulation (example: Hemophilia, cirrhosis of the liver, low platelet counts, etc.). However, even if you do not have one on these, it can  still happen. If you have any of these conditions, or take one of these drugs, make sure to notify your treating physician. Infection: This is more common in patients with a compromised immune system, either due to disease (example: diabetes, cancer, human immunodeficiency virus [HIV], etc.), or due to medications or treatments (example: therapies used to treat cancer and rheumatological diseases). However, even if you do not have one on these, it can still happen. If you have any of these conditions, or take one of these drugs, make sure to notify your treating physician. Nerve Damage: This is more common when the treatment is an invasive one, but it can also happen with the use of medications, such as those used in the treatment of cancer. The damage can occur to small secondary nerves, or to large primary ones, such as those in the spinal cord and brain. This damage may be temporary or permanent and it may lead to impairments that can range from temporary numbness to permanent paralysis and/or brain death. Allergic Reactions: Any time a substance or material comes in contact with our body, there is the possibility of an allergic reaction. These can range from a mild skin rash (contact dermatitis) to a severe systemic reaction (anaphylactic reaction), which can result in death. Death: In general, any medical intervention can result in death, most of the time due to an unforeseen complication. ____________________________________________________________________________________________  

## 2021-12-08 NOTE — Progress Notes (Signed)
Safety precautions to be maintained throughout the outpatient stay will include: orient to surroundings, keep bed in low position, maintain call bell within reach at all times, provide assistance with transfer out of bed and ambulation.  

## 2021-12-08 NOTE — Telephone Encounter (Signed)
Dr Dossie Arbour would like to do a Trigeminal Nerve block on this patient and would like approval for her to stop her Coumadin for 5 days.  Thank you.

## 2022-01-02 ENCOUNTER — Telehealth: Payer: Self-pay | Admitting: Pain Medicine

## 2022-01-02 NOTE — Telephone Encounter (Signed)
PT stated that she wanted to put everything on hold until after her surgery. PT will be having surgery done in [facility]. Thanks

## 2022-06-04 ENCOUNTER — Other Ambulatory Visit: Payer: Self-pay | Admitting: Physician Assistant

## 2022-06-04 DIAGNOSIS — R519 Headache, unspecified: Secondary | ICD-10-CM

## 2022-06-04 DIAGNOSIS — G500 Trigeminal neuralgia: Secondary | ICD-10-CM

## 2022-06-04 DIAGNOSIS — I10 Essential (primary) hypertension: Secondary | ICD-10-CM

## 2022-06-12 ENCOUNTER — Other Ambulatory Visit: Payer: Medicare Other

## 2022-12-23 ENCOUNTER — Ambulatory Visit
Admission: RE | Admit: 2022-12-23 | Discharge: 2022-12-23 | Disposition: A | Discharge status: Home / Self Care | Payer: Medicare Other | Source: Ambulatory Visit | Attending: Physician Assistant | Admitting: Physician Assistant

## 2022-12-23 DIAGNOSIS — I10 Essential (primary) hypertension: Secondary | ICD-10-CM | POA: Diagnosis present

## 2022-12-23 DIAGNOSIS — G500 Trigeminal neuralgia: Secondary | ICD-10-CM | POA: Insufficient documentation

## 2022-12-23 DIAGNOSIS — R519 Headache, unspecified: Secondary | ICD-10-CM | POA: Diagnosis present

## 2022-12-23 DIAGNOSIS — G8929 Other chronic pain: Secondary | ICD-10-CM | POA: Insufficient documentation

## 2022-12-23 LAB — POCT I-STAT CREATININE: Creatinine, Ser: 1.3 mg/dL — ABNORMAL HIGH (ref 0.44–1.00)

## 2022-12-23 MED ORDER — IOHEXOL 350 MG/ML SOLN
75.0000 mL | Freq: Once | INTRAVENOUS | Status: AC | PRN
  Administered 2022-12-23: 75 mL via INTRAVENOUS
